# Patient Record
Sex: Female | Born: 1965 | Race: White | Hispanic: No | Marital: Married | Smoking: Current every day smoker
Health system: Southern US, Community
[De-identification: ages and names within clinical notes are randomized; demographics above are authoritative.]

## PROBLEM LIST (undated history)

## (undated) DIAGNOSIS — E119 Type 2 diabetes mellitus without complications: Secondary | ICD-10-CM

## (undated) DIAGNOSIS — Z789 Other specified health status: Secondary | ICD-10-CM

## (undated) DIAGNOSIS — M79605 Pain in left leg: Secondary | ICD-10-CM

## (undated) HISTORY — DX: Other specified health status: Z78.9

## (undated) HISTORY — DX: Pain in left leg: M79.605

## (undated) HISTORY — DX: Type 2 diabetes mellitus without complications: E11.9

---

## 1997-03-11 ENCOUNTER — Inpatient Hospital Stay (HOSPITAL_COMMUNITY): Admission: AD | Admit: 1997-03-11 | Discharge: 1997-03-13 | Payer: Self-pay | Admitting: Obstetrics and Gynecology

## 1997-10-22 ENCOUNTER — Encounter: Admission: RE | Admit: 1997-10-22 | Discharge: 1998-01-20 | Payer: Self-pay | Admitting: Obstetrics and Gynecology

## 1997-11-15 ENCOUNTER — Inpatient Hospital Stay (HOSPITAL_COMMUNITY): Admission: AD | Admit: 1997-11-15 | Discharge: 1997-11-15 | Payer: Self-pay | Admitting: Obstetrics and Gynecology

## 1998-01-05 ENCOUNTER — Ambulatory Visit (HOSPITAL_COMMUNITY): Admission: RE | Admit: 1998-01-05 | Discharge: 1998-01-05 | Payer: Self-pay | Admitting: Obstetrics and Gynecology

## 1998-02-04 ENCOUNTER — Ambulatory Visit (HOSPITAL_COMMUNITY): Admission: RE | Admit: 1998-02-04 | Discharge: 1998-02-04 | Payer: Self-pay | Admitting: Obstetrics and Gynecology

## 1998-02-14 ENCOUNTER — Inpatient Hospital Stay (HOSPITAL_COMMUNITY): Admission: AD | Admit: 1998-02-14 | Discharge: 1998-02-17 | Payer: Self-pay | Admitting: Obstetrics and Gynecology

## 1998-03-16 ENCOUNTER — Other Ambulatory Visit: Admission: RE | Admit: 1998-03-16 | Discharge: 1998-03-16 | Payer: Self-pay | Admitting: Obstetrics and Gynecology

## 1999-05-04 ENCOUNTER — Other Ambulatory Visit: Admission: RE | Admit: 1999-05-04 | Discharge: 1999-05-04 | Payer: Self-pay | Admitting: Obstetrics and Gynecology

## 1999-06-01 ENCOUNTER — Other Ambulatory Visit: Admission: RE | Admit: 1999-06-01 | Discharge: 1999-06-01 | Payer: Self-pay | Admitting: Obstetrics and Gynecology

## 1999-11-20 ENCOUNTER — Encounter: Payer: Self-pay | Admitting: Family Medicine

## 1999-11-20 ENCOUNTER — Encounter: Admission: RE | Admit: 1999-11-20 | Discharge: 1999-11-20 | Payer: Self-pay | Admitting: Family Medicine

## 2000-02-01 ENCOUNTER — Encounter: Admission: RE | Admit: 2000-02-01 | Discharge: 2000-05-01 | Payer: Self-pay | Admitting: Family Medicine

## 2000-04-08 ENCOUNTER — Other Ambulatory Visit: Admission: RE | Admit: 2000-04-08 | Discharge: 2000-04-08 | Payer: Self-pay | Admitting: Obstetrics and Gynecology

## 2001-04-15 ENCOUNTER — Other Ambulatory Visit: Admission: RE | Admit: 2001-04-15 | Discharge: 2001-04-15 | Payer: Self-pay | Admitting: Obstetrics and Gynecology

## 2001-11-29 ENCOUNTER — Emergency Department (HOSPITAL_COMMUNITY): Admission: EM | Admit: 2001-11-29 | Discharge: 2001-11-29 | Payer: Self-pay | Admitting: *Deleted

## 2002-12-15 ENCOUNTER — Other Ambulatory Visit: Admission: RE | Admit: 2002-12-15 | Discharge: 2002-12-15 | Payer: Self-pay | Admitting: Obstetrics and Gynecology

## 2003-01-16 HISTORY — PX: CHOLECYSTECTOMY: SHX55

## 2004-01-29 ENCOUNTER — Inpatient Hospital Stay (HOSPITAL_COMMUNITY): Admission: EM | Admit: 2004-01-29 | Discharge: 2004-01-31 | Payer: Self-pay | Admitting: *Deleted

## 2009-11-07 ENCOUNTER — Ambulatory Visit: Payer: Self-pay | Admitting: Internal Medicine

## 2010-03-14 ENCOUNTER — Other Ambulatory Visit: Payer: Self-pay | Admitting: Obstetrics and Gynecology

## 2014-06-29 ENCOUNTER — Encounter: Payer: Self-pay | Admitting: Surgery

## 2014-07-01 ENCOUNTER — Other Ambulatory Visit: Payer: Self-pay | Admitting: *Deleted

## 2014-07-01 DIAGNOSIS — M79604 Pain in right leg: Secondary | ICD-10-CM

## 2014-07-01 DIAGNOSIS — M79605 Pain in left leg: Principal | ICD-10-CM

## 2014-07-02 ENCOUNTER — Ambulatory Visit (HOSPITAL_COMMUNITY)
Admission: RE | Admit: 2014-07-02 | Discharge: 2014-07-02 | Disposition: A | Discharge status: Home / Self Care | Payer: 59 | Source: Ambulatory Visit | Attending: Surgery | Admitting: Surgery

## 2014-07-02 ENCOUNTER — Encounter: Payer: Self-pay | Admitting: Surgery

## 2014-07-02 ENCOUNTER — Ambulatory Visit (INDEPENDENT_AMBULATORY_CARE_PROVIDER_SITE_OTHER): Payer: 59 | Admitting: Surgery

## 2014-07-02 VITALS — BP 138/78 | HR 78 | Resp 16 | Ht 67.0 in | Wt 228.5 lb

## 2014-07-02 DIAGNOSIS — M79604 Pain in right leg: Secondary | ICD-10-CM

## 2014-07-02 DIAGNOSIS — I872 Venous insufficiency (chronic) (peripheral): Secondary | ICD-10-CM | POA: Diagnosis not present

## 2014-07-02 DIAGNOSIS — M79605 Pain in left leg: Secondary | ICD-10-CM

## 2014-07-02 NOTE — Progress Notes (Signed)
Patient name: Danielle Mccall MRN: [id number] DOB: [birth year] Sex: female   Referred by: Self  Reason for referral:  Chief Complaint  Patient presents with  . New Evaluation    c/o pain over left thigh varicosity, worse with prolonged standing, bilateral lower extremity swelling   . Varicose Veins    HISTORY OF PRESENT ILLNESS: This is a 49 year old female who comes in today for a painful area in her left upper thigh which she feels like is a vein.  This is been going on for several months.  It is aggravated by being on her legs at work.  She does complain of bilateral swelling which is better in the morning and worse as she is standing.  She denies having any ulcers.  The patient suffers from diabetes which is poorly controlled.  She states her blood sugars are in the 3-400 range.  She is a positive smoker.  She suffers from hypercholesterolemia but is not taking any medications right now, because she does not like the way they make her feel  Past Medical History  Diagnosis Date  . Diabetes mellitus without complication   . Leg pain, left     Past Surgical History  Procedure Laterality Date  . Cholecystectomy  2005  . Cesarean section  2000    History   Social History  . Marital Status: Married    Spouse Name: N/A  . Number of Children: N/A  . Years of Education: N/A   Occupational History  . Not on file.   Social History Main Topics  . Smoking status: Current Every Day Smoker -- 1.00 packs/day for 26 years    Types: Cigarettes  . Smokeless tobacco: Not on file  . Alcohol Use: Yes     Comment: on rare occasssions  . Drug Use: No  . Sexual Activity: Not on file   Other Topics Concern  . Not on file   Social History Narrative  . No narrative on file    History reviewed. No pertinent family history.  Allergies as of 07/02/2014  . (No Known Allergies)    No current outpatient prescriptions on file prior to visit.   No current facility-administered  medications on file prior to visit.     REVIEW OF SYSTEMS: Cardiovascular: No chest pain, chest pressure, palpitations, orthopnea, or dyspnea on exertion. No claudication or rest pain, positive for varicose veins and leg swelling Pulmonary: No productive cough, asthma or wheezing. Neurologic: No weakness, paresthesias, aphasia, or amaurosis. No dizziness. Hematologic: No bleeding problems or clotting disorders. Musculoskeletal: No joint pain or joint swelling. Gastrointestinal: No blood in stool or hematemesis Genitourinary: No dysuria or hematuria. Psychiatric:: No history of major depression. Integumentary: No rashes or ulcers. Constitutional: No fever or chills.  PHYSICAL EXAMINATION:  Filed Vitals:   07/02/14 1220  BP: 138/78  Pulse: 78  Resp: 16  Height:  (1.702 m)  Weight: 228 lb 8 oz (103.647 kg)   Body mass index is 35.78 kg/(m^2). General: The patient appears their stated age.   HEENT:  No gross abnormalities Pulmonary: Respirations are non-labored Musculoskeletal: There are no major deformities.   Neurologic: No focal weakness or paresthesias are detected, Skin: There are no ulcer or rashes noted. Psychiatric: The patient has normal affect. Cardiovascular: Multiple clusters of reticular veins in the left leg.  When she stands up there is a prominent appearing small varix in the left upper thigh which correlates to her symptoms.  Diagnostic Studies: I  have reviewed her venous reflux examination.  On the left she has reflux in the common femoral vein as well as a short segment in the mid great saphenous.  On the right there are 2 areas of reflux within the small saphenous and no other deep or superficial reflux   Assessment:  Chronic venous insufficiency Plan: The patient comes in today because of complaints of an area which becomes prominent with standing in her left upper thigh.  I feel that this correlates to a small varix.  She also has bilateral edema.  I  have recommended that she try compression stockings, 20-30, thigh high.  This should help with her swelling and will likely minimize the discomfort she is having within this area and her left thigh.  She will contact me if she does not get complete relief from the compression stockings.  If she returns, I would need to ultrasound the area to confirm that it is a varix and consider stab phlebectomy versus surgical excision of this area which I told the patient out for not to do is a think the symptoms should resolve with time on the road.  She will follow up on an as-needed basis.     Jorge Ny, M.D. [facility] Office: [phone] Pager:  [phone]

## 2016-08-31 ENCOUNTER — Ambulatory Visit: Payer: Self-pay | Attending: Family Medicine | Admitting: Family Medicine

## 2016-08-31 ENCOUNTER — Ambulatory Visit: Payer: Self-pay | Attending: Family Medicine | Admitting: Licensed Clinical Social Worker

## 2016-08-31 ENCOUNTER — Encounter: Payer: Self-pay | Admitting: Family Medicine

## 2016-08-31 VITALS — BP 125/72 | HR 82 | Temp 98.0°F | Resp 18 | Ht 68.0 in | Wt 207.4 lb

## 2016-08-31 DIAGNOSIS — Z0000 Encounter for general adult medical examination without abnormal findings: Secondary | ICD-10-CM

## 2016-08-31 DIAGNOSIS — F419 Anxiety disorder, unspecified: Secondary | ICD-10-CM

## 2016-08-31 DIAGNOSIS — I8393 Asymptomatic varicose veins of bilateral lower extremities: Secondary | ICD-10-CM

## 2016-08-31 DIAGNOSIS — Z1159 Encounter for screening for other viral diseases: Secondary | ICD-10-CM

## 2016-08-31 DIAGNOSIS — E1165 Type 2 diabetes mellitus with hyperglycemia: Secondary | ICD-10-CM

## 2016-08-31 DIAGNOSIS — F329 Major depressive disorder, single episode, unspecified: Secondary | ICD-10-CM

## 2016-08-31 DIAGNOSIS — M7989 Other specified soft tissue disorders: Secondary | ICD-10-CM | POA: Insufficient documentation

## 2016-08-31 DIAGNOSIS — Z79899 Other long term (current) drug therapy: Secondary | ICD-10-CM | POA: Insufficient documentation

## 2016-08-31 DIAGNOSIS — Z7984 Long term (current) use of oral hypoglycemic drugs: Secondary | ICD-10-CM | POA: Insufficient documentation

## 2016-08-31 DIAGNOSIS — E119 Type 2 diabetes mellitus without complications: Secondary | ICD-10-CM | POA: Insufficient documentation

## 2016-08-31 DIAGNOSIS — F32A Depression, unspecified: Secondary | ICD-10-CM

## 2016-08-31 LAB — POCT GLYCOSYLATED HEMOGLOBIN (HGB A1C): Hemoglobin A1C: 9.7

## 2016-08-31 LAB — POCT UA - MICROALBUMIN
Creatinine, POC: 300 mg/dL
Microalbumin Ur, POC: 10 mg/L

## 2016-08-31 LAB — GLUCOSE, POCT (MANUAL RESULT ENTRY)
POC Glucose: 324 mg/dl — AB (ref 70–99)
POC Glucose: 226 mg/dl — AB (ref 70–99)

## 2016-08-31 MED ORDER — GLUCOSE BLOOD VI STRP: ORAL_STRIP | 12 refills | Status: DC

## 2016-08-31 MED ORDER — MEDICAL COMPRESSION STOCKINGS MISC: 0 refills | Status: DC

## 2016-08-31 MED ORDER — TRUEPLUS LANCETS 28G MISC: 1.0000 | Freq: Once | 12 refills | Status: AC

## 2016-08-31 MED ORDER — TRUE METRIX METER W/DEVICE KIT: 1.0000 | PACK | Freq: Once | 0 refills | Status: AC

## 2016-08-31 MED ORDER — INSULIN ASPART 100 UNIT/ML ~~LOC~~ SOLN: 10.0000 [IU] | Freq: Once | SUBCUTANEOUS | Status: DC

## 2016-08-31 MED ORDER — METFORMIN HCL 1000 MG PO TABS: 1000.0000 mg | ORAL_TABLET | Freq: Two times a day (BID) | ORAL | 2 refills | Status: DC

## 2016-08-31 MED ORDER — CITALOPRAM HYDROBROMIDE 20 MG PO TABS: 20.0000 mg | ORAL_TABLET | Freq: Every day | ORAL | 1 refills | Status: DC

## 2016-08-31 NOTE — Progress Notes (Signed)
Patient is here for establish care   Patient been without medication for 6 months   Patient has not eaten for today

## 2016-08-31 NOTE — Progress Notes (Signed)
Subjective:  Patient ID: Danielle Mccall, female    DOB: 04-Feb-1965  Age: 51 y.o. MRN: [id number]  CC: Establish Care and Diabetes   HPI Danielle Mccall presents for Diabetes Mellitus: Patient presents for follow up of diabetes. She reports being without her medications for 6 months. Symptoms: none. Patient denies foot ulcerations, nausea, paresthesia of the feet, polydipsia, polyuria, visual disturbances and vomitting.  Evaluation to date has been included: fasting blood sugar and hemoglobin A1C.  Home sugars: patient does not check sugars. Treatment to date: metformin. Anxiety and Depression: Patient complains of anxious and depressed mood. She complains of depressed mood, difficulty concentrating and irritability. Onset was approximately 15 years ago, stable since that time.  She denies current suicidal and homicidal plan or intent.   Possible organic causes contributing are: none.  Risk factors: previous episode of depression Previous treatment includes Celexa . She reports starting and stopping medications on her own based on symptoms improvement. She complains of the following side effects from the treatment: none. She declines referrals. She is agreeable to speaking with LCSW. She complains of bilateral leg swelling. Patient denies chest pain, chest pressure/discomfort, near-syncope, orthopnea, palpitations and syncope.  She reports taking diuretics in the past to help with symptoms. Symptoms are intermittent and are worse in the evening. 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 Requests screening for Hep C. Reports ex-husband tested positive for Hep C. She reports being tested within the last 6 months and was negative but requests repeat testing.   Outpatient Medications Prior to Visit  Medication Sig Dispense Refill  . citalopram (CELEXA) 20 MG tablet Take 20 mg by mouth daily.    . metFORMIN (GLUCOPHAGE) 500 MG tablet Take 500 mg by mouth 2 (two) times  daily with a meal.     No facility-administered medications prior to visit.     ROS Review of Systems  Constitutional: Negative.   Eyes: Negative.   Respiratory: Negative.   Cardiovascular: Positive for leg swelling (edema). Negative for chest pain and palpitations.  Gastrointestinal: Negative.   Endocrine: Negative.   Musculoskeletal: Negative.   Skin: Negative.   Neurological: Negative.   Psychiatric/Behavioral: Positive for dysphoric mood. The patient is nervous/anxious.    Objective:  BP 125/72 (BP Location: Left Arm, Patient Position: Sitting, Cuff Size: Normal)   Pulse 82   Temp 98 F (36.7 C) (Oral)   Resp 18   Ht _0  (1.727 m)   Wt 207 lb 6.4 oz (94.1 kg)   SpO2 97%   BMI 31.54 kg/m   BP/Weight 08/31/2016 1/65/5374  Systolic BP 827 078  Diastolic BP 72 78  Wt. (Lbs) 207.4 228.5  BMI 31.54 35.78   Physical Exam  Constitutional: She appears well-developed and well-nourished.  Eyes: Pupils are equal, round, and reactive to light. Conjunctivae are normal.  Neck: Normal range of motion. Neck supple.  Cardiovascular: Normal rate, regular rhythm, normal heart sounds and intact distal pulses.   Pulmonary/Chest: Effort normal and breath sounds normal.  Abdominal: Soft. Bowel sounds are normal. There is no tenderness.  Skin: Skin is warm and dry. Abrasion (healed abrasions to anterior bilateral feet) noted.  Varicose veins BLE  Psychiatric: Her speech is normal. Her mood appears anxious. She expresses no homicidal and no suicidal ideation. She expresses no suicidal plans and no homicidal plans.  Nursing note and vitals reviewed.  Diabetic Foot Exam - Simple   Simple Foot Form Diabetic Foot exam was performed with the  following findings:  Yes 08/31/2016  9:24 AM  Visual Inspection See comments:  Yes Sensation Testing Intact to touch and monofilament testing bilaterally:  Yes Pulse Check Posterior Tibialis and Dorsalis pulse intact bilaterally:   Yes Comments Healed abrasions to bilateral feet.       Assessment & Plan:   Problem List Items Addressed This Visit      Endocrine   Diabetes mellitus without complication ([place]) - Primary   Increased dose of metformin   Check CBG BID bring glucometer/ log to next office visit   Follow up in 8 weeks.   Relevant Medications   insulin aspart (novoLOG) injection 10 Units   metFORMIN (GLUCOPHAGE) 1000 MG tablet   glucose blood test strip    Other Visit Diagnoses    Varicose veins of legs       Relevant Medications   Elastic Bandages & Supports (MEDICAL COMPRESSION STOCKINGS) MISC   Anxiety and depression       Follow up with LCSW in 4 weeks   Follow up with PCP in 8 weeks.   Relevant Medications   citalopram (CELEXA) 20 MG tablet   Need for hepatitis C screening test       Relevant Orders   Hepatitis C Antibody (Completed)   Healthcare maintenance       Relevant Orders   HIV antibody (with reflex) (Completed)      Meds ordered this encounter  Medications  . insulin aspart (novoLOG) injection 10 Units  . citalopram (CELEXA) 20 MG tablet    Sig: Take 1 tablet (20 mg total) by mouth daily.    Dispense:  30 tablet    Refill:  1    Order Specific Question:   Supervising Provider    Answer:   Tresa Garter [id number]  . metFORMIN (GLUCOPHAGE) 1000 MG tablet    Sig: Take 1 tablet (1,000 mg total) by mouth 2 (two) times daily with a meal.    Dispense:  60 tablet    Refill:  2    Order Specific Question:   Supervising Provider    Answer:   Tresa Garter [id number]  . Elastic Bandages & Supports (MEDICAL COMPRESSION STOCKINGS) MISC    Sig: APPLY ONCE TO LEFT AND RIGHT LOWER EXTREMITY FOR SWELLING AND SUPPORT. TO BE FITTED BY MEDICAL SUPPLY.    Dispense:  2 each    Refill:  0    Order Specific Question:   Supervising Provider    Answer:   Tresa Garter [id number]  . Blood Glucose Monitoring Suppl (TRUE METRIX METER) w/Device KIT    Sig: 1 Device by Does  not apply route once.    Dispense:  1 kit    Refill:  0    Order Specific Question:   Supervising Provider    Answer:   Tresa Garter [id number]  . TRUEPLUS LANCETS 28G MISC    Sig: 1 kit by Does not apply route once.    Dispense:  100 each    Refill:  12    Order Specific Question:   Supervising Provider    Answer:   Tresa Garter [id number]  . glucose blood test strip    Sig: Use as instructed    Dispense:  100 each    Refill:  12    Order Specific Question:   Supervising Provider    Answer:   Tresa Garter [[id number]]    Follow-up: Return in about 4 weeks (around 09/28/2016) for Depression/Anxiety with  Jasmine.   Alfonse Spruce FNP

## 2016-08-31 NOTE — BH Specialist Note (Signed)
Integrated Behavioral Health Initial Visit  MRN: [id number] Name: Danielle Mccall   Session Start time: 9:55 AM Session End time: 10:20 AM Total time: 25 minutes  Type of Service: Integrated Behavioral Health- Individual/Family Interpretor:No. Interpretor Name and Language: N/A   Warm Hand Off Completed.       SUBJECTIVE: Danielle Mccall is a 51 y.o. female accompanied by patient. Patient was referred by FNP Hairston for anxiety. Patient reports the following symptoms/concerns: overwhelming feelings of worry, difficulty relaxing, and irritability Duration of problem: "a while"; Severity of problem: severe  OBJECTIVE: Mood: Anxious and Affect: Appropriate Risk of harm to self or others: No plan to harm self or others   LIFE CONTEXT: Family and Social: Pt resides with adult daughter who she receives emotional support from. She has additional family and friends that she socializes with School/Work: Pt recently became unemployed (two weeks); however, boyfriend is employed. Pt does not receive any public benefits Self-Care: Pt enjoys walking. She reports smoking cigarettes (1 ppd) and marijuana "occasionally" Life Changes: Pt ended a ten year relationship with an "alcoholic". She is currently unemployed and experiencing financial strain  GOALS ADDRESSED: Patient will reduce symptoms of: anxiety and increase knowledge and/or ability of: coping skills and also: Increase adequate support systems for patient/family   INTERVENTIONS: Solution-Focused Strategies, Supportive Counseling, Psychoeducation and/or Health Education and Link to [facility]  Standardized Assessments completed: GAD-7 and PHQ 2&9  ASSESSMENT: Patient currently experiencing anxiety triggered by financial strain. She reports overwhelming feelings of worry, difficulty relaxing, and irritability. Patient receives limited support. Patient may benefit from psychoeducation, psychotherapy, and medication  management. LCSWA educated pt on how stress and substance use can negatively impact one's physical and mental health. LCSWA discussed benefits of applying healthy coping skills to decrease symptoms. Pt identified healthy strategies to implement on a routine basis. She was strongly encouraged to complete application for financial counseling and initiate psychotherapy. Pt plans to participate in medication management through PCP. LCSWA provided community resources for crisis intervention, psychotherapy, and food insecurity. PLAN: 1. Follow up with behavioral health clinician on : Pt was encouraged to contact LCSWA if symptoms worsen or fail to improve to schedule behavioral appointments at [facility]. 2. Behavioral recommendations: LCSWA recommends that pt apply healthy coping skills discussed, comply with medication management, and utilize provided resources. Pt is encouraged to schedule follow up appointment with LCSWA 3. Referral(s): Integrated Art gallery manager (In Clinic), Community Mental Health Services (LME/Outside Clinic) and Community Resources:  Food and Finances 4. "From scale of 1-10, how likely are you to follow plan?": 8/10  Danielle Larsson, LCSW 09/04/16 2:51 PM

## 2016-08-31 NOTE — Patient Instructions (Addendum)
Apply for orange card. Follow up with PCP in 8 weeks.  Type 2 Diabetes Mellitus, Self Care, Adult When you have type 2 diabetes (type 2 diabetes mellitus), you must keep your blood sugar (glucose) under control. You can do this with:  Nutrition.  Exercise.  Lifestyle changes.  Medicines or insulin, if needed.  Support from your doctors and others.  How do I manage my blood sugar?  Check your blood sugar level every day, as often as told.  Call your doctor if your blood sugar is above your goal numbers for 2 tests in a row.  Have your A1c (hemoglobin A1c) level checked at least two times a year. Have it checked more often if your doctor tells you to. Your doctor will set treatment goals for you. Generally, you should have these blood sugar levels:  Before meals (preprandial): 80-130 mg/dL (4.4-7.2 mmol/L).  After meals (postprandial): lower than 180 mg/dL (10 mmol/L).  A1c level: less than 7%.  What do I need to know about high blood sugar? High blood sugar is called hyperglycemia. Know the signs of high blood sugar. Signs may include:  Feeling: ? Thirsty. ? Hungry. ? Very tired.  Needing to pee (urinate) more than usual.  Blurry vision.  What do I need to know about low blood sugar? Low blood sugar is called hypoglycemia. This is when blood sugar is at or below 70 mg/dL (3.9 mmol/L). Symptoms may include:  Feeling: ? Hungry. ? Worried or nervous (anxious). ? Sweaty and clammy. ? Confused. ? Dizzy. ? Sleepy. ? Sick to your stomach (nauseous).  Having: ? A fast heartbeat (palpitations). ? A headache. ? A change in your vision. ? Jerky movements that you cannot control (seizure). ? Nightmares. ? Tingling or no feeling (numbness) around the mouth, lips, or tongue.  Having trouble with: ? Talking. ? Paying attention (concentrating). ? Moving (coordination). ? Sleeping.  Shaking.  Passing out (fainting).  Getting upset easily  (irritability).  Treating low blood sugar  To treat low blood sugar, eat or drink something sugary right away. If you can think clearly and swallow safely, follow the 15:15 rule:  Take 15 grams of a fast-acting carb (carbohydrate). Some fast-acting carbs are: ? 1 tube of glucose gel. ? 3 sugar tablets (glucose pills). ? 6-8 pieces of hard candy. ? 4 oz (120 mL) of fruit juice. ? 4 oz (120 mL) regular (not diet) soda.  Check your blood sugar 15 minutes after you take the carb.  If your blood sugar is still at or below 70 mg/dL (3.9 mmol/L), take 15 grams of a carb again.  If your blood sugar does not go above 70 mg/dL (3.9 mmol/L) after 3 tries, get help right away.  After your blood sugar goes back to normal, eat a meal or a snack within 1 hour.  Treating very low blood sugar If your blood sugar is at or below 54 mg/dL (3 mmol/L), you have very low blood sugar (severe hypoglycemia). This is an emergency. Do not wait to see if the symptoms will go away. Get medical help right away. Call your local emergency services (911 in the [place] Do not drive yourself to the hospital. If you have very low blood sugar and you cannot eat or drink, you may need a glucagon shot (injection). A family member or friend should learn how to check your blood sugar and how to give you a glucagon shot. Ask your doctor if you need to have a glucagon shot  kit at home. What else is important to manage my diabetes? Medicine Follow these instructions about insulin and diabetes medicines:  Take them as told by your doctor.  Adjust them as told by your doctor.  Do not run out of them.  Having diabetes can raise your risk for other long-term conditions. These include heart or kidney disease. Your doctor may prescribe medicines to help prevent problems from diabetes. Food   Make healthy food choices. These include: ? Chicken, fish, egg whites, and beans. ? Oats, whole wheat, bulgur, brown rice, quinoa, and  millet. ? Fresh fruits and vegetables. ? Low-fat dairy products. ? Nuts, avocado, olive oil, and canola oil.  Make a food plan with a specialist (dietitian).  Follow instructions from your doctor about what you cannot eat or drink.  Drink enough fluid to keep your pee (urine) clear or pale yellow.  Eat healthy snacks between healthy meals.  Keep track of carbs that you eat. Read food labels. Learn food serving sizes.  Follow your sick day plan when you cannot eat or drink normally. Make this plan with your doctor so it is ready to use. Activity  Exercise at least 3 times a week.  Do not go more than 2 days without exercising.  Talk with your doctor before you start a new exercise. Your doctor may need to adjust your insulin, medicines, or food. Lifestyle   Do not use any tobacco products. These include cigarettes, chewing tobacco, and e-cigarettes.If you need help quitting, ask your doctor.  Ask your doctor how much alcohol is safe for you.  Learn to deal with stress. If you need help with this, ask your doctor. Body care  Stay up to date with your shots (immunizations).  Have your eyes and feet checked by a doctor as often as told.  Check your skin and feet every day. Check for cuts, bruises, redness, blisters, or sores.  Brush your teeth and gums two times a day.  Floss at least one time a day.  Go to the dentist least one time every 6 months.  Stay at a healthy weight. General instructions   Take over-the-counter and prescription medicines only as told by your doctor.  Share your diabetes care plan with: ? Your work or school. ? People you live with.  Check your pee (urine) for ketones: ? When you are sick. ? As told by your doctor.  Carry a card or wear jewelry that says that you have diabetes.  Ask your doctor: ? Do I need to meet with a diabetes educator? ? Where can I find a support group for people with diabetes?  Keep all follow-up visits as  told by your doctor. This is important. Where to find more information: To learn more about diabetes, visit:  American Diabetes Association: www.diabetes.org  American Association of Diabetes Educators: www.diabeteseducator.org/patient-resources  This information is not intended to replace advice given to you by your health care provider. Make sure you discuss any questions you have with your health care provider. Document Released: 04/25/2015 Document Revised: 06/09/2015 Document Reviewed: 02/04/2015 Elsevier Interactive Patient Education  2018 [employer].   Varicose Veins Varicose veins are veins that have become enlarged and twisted. They are usually seen in the legs but can occur in other parts of the body as well. What are the causes? This condition is the result of valves in the veins not working properly. Valves in the veins help to return blood from the leg to the heart. If  these valves are damaged, blood flows backward and backs up into the veins in the leg near the skin. This causes the veins to become larger. What increases the risk? People who are on their feet a lot, who are pregnant, or who are overweight are more likely to develop varicose veins. What are the signs or symptoms?  Bulging, twisted-appearing, bluish veins, most commonly found on the legs.  Leg pain or a feeling of heaviness. These symptoms may be worse at the end of the day.  Leg swelling.  Changes in skin color. How is this diagnosed? A health care provider can usually diagnose varicose veins by examining your legs. Your health care provider may also recommend an ultrasound of your leg veins. How is this treated? Most varicose veins can be treated at home.However, other treatments are available for people who have persistent symptoms or want to improve the cosmetic appearance of the varicose veins. These treatment options include:  Sclerotherapy. A solution is injected into the vein to close it  off.  Laser treatment. A laser is used to heat the vein to close it off.  Radiofrequency vein ablation. An electrical current produced by radio waves is used to close off the vein.  Phlebectomy. The vein is surgically removed through small incisions made over the varicose vein.  Vein ligation and stripping. The vein is surgically removed through incisions made over the varicose vein after the vein has been tied (ligated).  Follow these instructions at home:  Do not stand or sit in one position for long periods of time. Do not sit with your legs crossed. Rest with your legs raised during the day.  Wear compression stockings as directed by your health care provider. These stockings help to prevent blood clots and reduce swelling in your legs.  Do not wear other tight, encircling garments around your legs, pelvis, or waist.  Walk as much as possible to increase blood flow.  Raise the foot of your bed at night with 2-inch blocks.  If you get a cut in the skin over the vein and the vein bleeds, lie down with your leg raised and press on it with a clean cloth until the bleeding stops. Then place a bandage (dressing) on the cut. See your health care provider if it continues to bleed. Contact a health care provider if:  The skin around your ankle starts to break down.  You have pain, redness, tenderness, or hard swelling in your leg over a vein.  You are uncomfortable because of leg pain. This information is not intended to replace advice given to you by your health care provider. Make sure you discuss any questions you have with your health care provider. Document Released: 10/11/2004 Document Revised: 06/09/2015 Document Reviewed: 07/05/2015 Elsevier Interactive Patient Education  2017 [employer].

## 2016-09-01 LAB — HEPATITIS C ANTIBODY

## 2016-09-01 LAB — CMP14+EGFR
A/G RATIO: 1.4 (ref 1.2–2.2)
ALBUMIN: 4.1 g/dL (ref 3.5–5.5)
ALK PHOS: 67 IU/L (ref 39–117)
ALT: 13 IU/L (ref 0–32)
AST: 13 IU/L (ref 0–40)
BILIRUBIN TOTAL: 0.3 mg/dL (ref 0.0–1.2)
BUN / CREAT RATIO: 17 (ref 9–23)
BUN: 11 mg/dL (ref 6–24)
CHLORIDE: 99 mmol/L (ref 96–106)
CO2: 24 mmol/L (ref 20–29)
Calcium: 9.9 mg/dL (ref 8.7–10.2)
Creatinine, Ser: 0.64 mg/dL (ref 0.57–1.00)
GFR calc Af Amer: 121 mL/min/{1.73_m2} (ref >59)
GFR calc non Af Amer: 105 mL/min/{1.73_m2} (ref >59)
GLUCOSE: 211 mg/dL — AB (ref 65–99)
Globulin, Total: 2.9 g/dL (ref 1.5–4.5)
POTASSIUM: 5.2 mmol/L (ref 3.5–5.2)
Sodium: 139 mmol/L (ref 134–144)
Total Protein: 7 g/dL (ref 6.0–8.5)

## 2016-09-01 LAB — LIPID PANEL
CHOLESTEROL TOTAL: 261 mg/dL — AB (ref 100–199)
Chol/HDL Ratio: 3.8 ratio (ref 0.0–4.4)
HDL: 69 mg/dL (ref >39)
LDL Calculated: 175 mg/dL — ABNORMAL HIGH (ref 0–99)
Triglycerides: 86 mg/dL (ref 0–149)
VLDL CHOLESTEROL CAL: 17 mg/dL (ref 5–40)

## 2016-09-01 LAB — HIV ANTIBODY (ROUTINE TESTING W REFLEX): HIV SCREEN 4TH GENERATION: NONREACTIVE

## 2016-09-10 ENCOUNTER — Other Ambulatory Visit: Payer: Self-pay | Admitting: Family Medicine

## 2016-09-10 DIAGNOSIS — E782 Mixed hyperlipidemia: Secondary | ICD-10-CM

## 2016-09-10 MED ORDER — ATORVASTATIN CALCIUM 20 MG PO TABS: 20.0000 mg | ORAL_TABLET | Freq: Every day | ORAL | 2 refills | Status: DC

## 2016-09-11 ENCOUNTER — Telehealth: Payer: Self-pay

## 2016-09-11 NOTE — Telephone Encounter (Signed)
-----   Message from Mandesia R Hairston, FNP sent at 09/10/2016  7:38 PM EDT ----- Lipid levels were elevated. This can increase your risk of heart disease. You will be prescribed atorvastatin. Recommend follow up in 3 months. Hepatitis C is negative.  HIV is negative.  Kidney function normal Liver function normal  

## 2016-09-11 NOTE — Telephone Encounter (Signed)
CMA call regarding lab results  Patient did not answer & unable to leave message  

## 2016-09-12 NOTE — Telephone Encounter (Signed)
Unable to leave message. Voicemail is not set up

## 2016-09-18 ENCOUNTER — Telehealth: Payer: Self-pay

## 2016-09-18 NOTE — Telephone Encounter (Signed)
CMA call regarding lab results   Patient did not answer but unable to leave message

## 2016-09-18 NOTE — Telephone Encounter (Signed)
-----   Message from Lizbeth BarkMandesia R Hairston, FNP sent at 09/10/2016  7:38 PM EDT ----- Lipid levels were elevated. This can increase your risk of heart disease. You will be prescribed atorvastatin. Recommend follow up in 3 months. Hepatitis C is negative.  HIV is negative.  Kidney function normal Liver function normal

## 2016-09-28 ENCOUNTER — Ambulatory Visit: Payer: Self-pay | Admitting: Family Medicine

## 2016-10-23 ENCOUNTER — Encounter: Payer: Self-pay | Admitting: Family Medicine

## 2016-10-23 ENCOUNTER — Ambulatory Visit: Payer: BC Managed Care – PPO | Attending: Family Medicine | Admitting: Family Medicine

## 2016-10-23 VITALS — BP 118/75 | HR 85 | Temp 98.1°F | Resp 18 | Ht 68.0 in | Wt 203.8 lb

## 2016-10-23 DIAGNOSIS — E782 Mixed hyperlipidemia: Secondary | ICD-10-CM

## 2016-10-23 DIAGNOSIS — B9789 Other viral agents as the cause of diseases classified elsewhere: Secondary | ICD-10-CM

## 2016-10-23 DIAGNOSIS — E1165 Type 2 diabetes mellitus with hyperglycemia: Secondary | ICD-10-CM | POA: Diagnosis not present

## 2016-10-23 DIAGNOSIS — Z7984 Long term (current) use of oral hypoglycemic drugs: Secondary | ICD-10-CM | POA: Insufficient documentation

## 2016-10-23 DIAGNOSIS — R197 Diarrhea, unspecified: Secondary | ICD-10-CM | POA: Diagnosis not present

## 2016-10-23 DIAGNOSIS — Z79899 Other long term (current) drug therapy: Secondary | ICD-10-CM | POA: Insufficient documentation

## 2016-10-23 DIAGNOSIS — J069 Acute upper respiratory infection, unspecified: Secondary | ICD-10-CM

## 2016-10-23 DIAGNOSIS — E119 Type 2 diabetes mellitus without complications: Secondary | ICD-10-CM

## 2016-10-23 LAB — GLUCOSE, POCT (MANUAL RESULT ENTRY): POC GLUCOSE: 242 mg/dL — AB (ref 70–99)

## 2016-10-23 MED ORDER — PHENYLEPHRINE-CHLORPHEN-DM 5-2-15 MG/5ML PO SYRP: 5.0000 mL | ORAL_SOLUTION | Freq: Four times a day (QID) | ORAL | 0 refills | Status: DC | PRN

## 2016-10-23 MED ORDER — ATORVASTATIN CALCIUM 20 MG PO TABS: 20.0000 mg | ORAL_TABLET | Freq: Every day | ORAL | 2 refills | Status: DC

## 2016-10-23 MED ORDER — METFORMIN HCL ER 500 MG PO TB24: 1000.0000 mg | ORAL_TABLET | Freq: Two times a day (BID) | ORAL | 2 refills | Status: DC

## 2016-10-23 MED ORDER — LOPERAMIDE HCL 2 MG PO TABS: 2.0000 mg | ORAL_TABLET | Freq: Four times a day (QID) | ORAL | 0 refills | Status: DC | PRN

## 2016-10-23 NOTE — Patient Instructions (Addendum)
Food Choices to Help Relieve Diarrhea, Adult When you have diarrhea, the foods you eat and your eating habits are very important. Choosing the right foods and drinks can help:  Relieve diarrhea.  Replace lost fluids and nutrients.  Prevent dehydration.  What general guidelines should I follow? Relieving diarrhea  Choose foods with less than 2 g or .07 oz. of fiber per serving.  Limit fats to less than 8 tsp (38 g or 1.34 oz.) a day.  Avoid the following: ? Foods and beverages sweetened with high-fructose corn syrup, honey, or sugar alcohols such as xylitol, sorbitol, and mannitol. ? Foods that contain a lot of fat or sugar. ? Fried, greasy, or spicy foods. ? High-fiber grains, breads, and cereals. ? Raw fruits and vegetables.  Eat foods that are rich in probiotics. These foods include dairy products such as yogurt and fermented milk products. They help increase healthy bacteria in the stomach and intestines (gastrointestinal tract, or GI tract).  If you have lactose intolerance, avoid dairy products. These may make your diarrhea worse.  Take medicine to help stop diarrhea (antidiarrheal medicine) only as told by your health care provider. Replacing nutrients  Eat small meals or snacks every 3-4 hours.  Eat bland foods, such as white rice, toast, or baked potato, until your diarrhea starts to get better. Gradually reintroduce nutrient-rich foods as tolerated or as told by your health care provider. This includes: ? Well-cooked protein foods. ? Peeled, seeded, and soft-cooked fruits and vegetables. ? Low-fat dairy products.  Take vitamin and mineral supplements as told by your health care provider. Preventing dehydration   Start by sipping water or a special solution to prevent dehydration (oral rehydration solution, ORS). Urine that is clear or pale yellow means that you are getting enough fluid.  Try to drink at least 8-10 cups of fluid each day to help replace lost  fluids.  You may add other liquids in addition to water, such as clear juice or decaffeinated sports drinks, as tolerated or as told by your health care provider.  Avoid drinks with caffeine, such as coffee, tea, or soft drinks.  Avoid alcohol. What foods are recommended? The items listed may not be a complete list. Talk with your health care provider about what dietary choices are best for you. Grains White rice. White, French, or pita breads (fresh or toasted), including plain rolls, buns, or bagels. White pasta. Saltine, soda, or graham crackers. Pretzels. Low-fiber cereal. Cooked cereals made with water (such as cornmeal, farina, or cream cereals). Plain muffins. Matzo. Melba toast. Zwieback. Vegetables Potatoes (without the skin). Most well-cooked and canned vegetables without skins or seeds. Tender lettuce. Fruits Apple sauce. Fruits canned in juice. Cooked apricots, cherries, grapefruit, peaches, pears, or plums. Fresh bananas and cantaloupe. Meats and other protein foods Baked or boiled chicken. Eggs. Tofu. Fish. Seafood. Smooth nut butters. Ground or well-cooked tender beef, ham, veal, lamb, pork, or poultry. Dairy Plain yogurt, kefir, and unsweetened liquid yogurt. Lactose-free milk, buttermilk, skim milk, or soy milk. Low-fat or nonfat hard cheese. Beverages Water. Low-calorie sports drinks. Fruit juices without pulp. Strained tomato and vegetable juices. Decaffeinated teas. Sugar-free beverages not sweetened with sugar alcohols. Oral rehydration solutions, if approved by your health care provider. Seasoning and other foods Bouillon, broth, or soups made from recommended foods. What foods are not recommended? The items listed may not be a complete list. Talk with your health care provider about what dietary choices are best for you. Grains Whole grain, whole wheat,   bran, or rye breads, rolls, pastas, and crackers. Wild or brown rice. Whole grain or bran cereals. Barley. Oats and  oatmeal. Corn tortillas or taco shells. Granola. Popcorn. Vegetables Raw vegetables. Fried vegetables. Cabbage, broccoli, Brussels sprouts, artichokes, baked beans, beet greens, corn, kale, legumes, peas, sweet potatoes, and yams. Potato skins. Cooked spinach and cabbage. Fruits Dried fruit, including raisins and dates. Raw fruits. Stewed or dried prunes. Canned fruits with syrup. Meat and other protein foods Fried or fatty meats. Deli meats. Chunky nut butters. Nuts and seeds. Beans and lentils. Tomasa Blase. Hot dogs. Sausage. Dairy High-fat cheeses. Whole milk, chocolate milk, and beverages made with milk, such as milk shakes. Half-and-half. Cream. sour cream. Ice cream. Beverages Caffeinated beverages (such as coffee, tea, soda, or energy drinks). Alcoholic beverages. Fruit juices with pulp. Prune juice. Soft drinks sweetened with high-fructose corn syrup or sugar alcohols. High-calorie sports drinks. Fats and oils Butter. Cream sauces. Margarine. Salad oils. Plain salad dressings. Olives. Avocados. Mayonnaise. Sweets and desserts Sweet rolls, doughnuts, and sweet breads. Sugar-free desserts sweetened with sugar alcohols such as xylitol and sorbitol. Seasoning and other foods Honey. Hot sauce. Chili powder. Gravy. Cream-based or milk-based soups. Pancakes and waffles. Summary  When you have diarrhea, the foods you eat and your eating habits are very important.  Make sure you get at least 8-10 cups of fluid each day, or enough to keep your urine clear or pale yellow.  Eat bland foods and gradually reintroduce healthy, nutrient-rich foods as tolerated, or as told by your health care provider.  Avoid high-fiber, fried, greasy, or spicy foods. This information is not intended to replace advice given to you by your health care provider. Make sure you discuss any questions you have with your health care provider. Document Released: 03/24/2003 Document Revised: 12/30/2015 Document Reviewed:  12/30/2015 Elsevier Interactive Patient Education  2017 [employer]  Upper Respiratory Infection, Adult Most upper respiratory infections (URIs) are caused by a virus. A URI affects the nose, throat, and upper air passages. The most common type of URI is often called "the common cold." Follow these instructions at home:  Take medicines only as told by your doctor.  Gargle warm saltwater or take cough drops to comfort your throat as told by your doctor.  Use a warm mist humidifier or inhale steam from a shower to increase air moisture. This may make it easier to breathe.  Drink enough fluid to keep your pee (urine) clear or pale yellow.  Eat soups and other clear broths.  Have a healthy diet.  Rest as needed.  Go back to work when your fever is gone or your doctor says it is okay. ? You may need to stay home longer to avoid giving your URI to others. ? You can also wear a face mask and wash your hands often to prevent spread of the virus.  Use your inhaler more if you have asthma.  Do not use any tobacco products, including cigarettes, chewing tobacco, or electronic cigarettes. If you need help quitting, ask your doctor. Contact a doctor if:  You are getting worse, not better.  Your symptoms are not helped by medicine.  You have chills.  You are getting more short of breath.  You have brown or red mucus.  You have yellow or brown discharge from your nose.  You have pain in your face, especially when you bend forward.  You have a fever.  You have puffy (swollen) neck glands.  You have pain while swallowing.  You have white areas in the back of your throat. Get help right away if:  You have very bad or constant: ? Headache. ? Ear pain. ? Pain in your forehead, behind your eyes, and over your cheekbones (sinus pain). ? Chest pain.  You have long-lasting (chronic) lung disease and any of the following: ? Wheezing. ? Long-lasting cough. ? Coughing up  blood. ? A change in your usual mucus.  You have a stiff neck.  You have changes in your: ? Vision. ? Hearing. ? Thinking. ? Mood. This information is not intended to replace advice given to you by your health care provider. Make sure you discuss any questions you have with your health care provider. Document Released: 06/20/2007 Document Revised: 09/04/2015 Document Reviewed: 04/08/2013 Elsevier Interactive Patient Education  2018 [employer].

## 2016-10-23 NOTE — Progress Notes (Signed)
Patient is here for nauseas & upset stomach  No fever or vomiting  Patient complains cold sweat   Patient has not taking her metformin for today

## 2016-10-23 NOTE — Progress Notes (Deleted)
Friday before last  Runny nose, cough worse at night scant clear drainage , congestion  Sunday nausea,  went work feel light headeded  Stomach upset  Laying down feels better  Fatigue Boyfriend had simliar symptoms No chills/body aches  Diarrhea "really bad" Wasn't taking metformin as directed  Started taking 2nd pill Sunday Monday upset stomach Diarrheas  4 to 5  This monring 2 wtice  New foods  OTC alka seltzer cold/flu , Nyquil, tylenol severe cold  Stopped Sunday  im

## 2016-10-24 NOTE — Progress Notes (Signed)
Subjective:  Patient ID: Danielle Mccall, female    DOB: [birth year]  Age: 51 y.o. MRN: [id number]  CC: Establish Care   HPI Danielle Mccall presents for gastrointestinal and upper respiratory symptoms. Onset was Friday before last. She reports symptoms of rhinorrhea, cough productive cough with scant clear-sputum, and nasal congestion. She reports on Sunday developing nausea, fatigue, and feeling lightheaded. She reports episodes of diarrhea on Sunday and Monday. She reports 4 episodes on Sunday and 2 episodes on Monday. She reports she started taking metformin twice a day Sunday and Monday. She denies any new foods or contacts. She reports her boyfriend had similar symptoms of nausea. She reports previously only taking metformin daily instead of twice a day as prescribed. She denies any symptoms of chills, body aches, fever. She reports taking over-the-counter Alka-Seltzer cold and flu, NyQuil, Tylenol severe cold for symptoms.     Outpatient Medications Prior to Visit  Medication Sig Dispense Refill  . metFORMIN (GLUCOPHAGE) 1000 MG tablet Take 1 tablet (1,000 mg total) by mouth 2 (two) times daily with a meal. 60 tablet 2  . citalopram (CELEXA) 20 MG tablet Take 1 tablet (20 mg total) by mouth daily. 30 tablet 1  . Elastic Bandages & Supports (MEDICAL COMPRESSION STOCKINGS) MISC APPLY ONCE TO LEFT AND RIGHT LOWER EXTREMITY FOR SWELLING AND SUPPORT. TO BE FITTED BY MEDICAL SUPPLY. 2 each 0  . glucose blood test strip Use as instructed 100 each 12  . atorvastatin (LIPITOR) 20 MG tablet Take 1 tablet (20 mg total) by mouth daily. 30 tablet 2   Facility-Administered Medications Prior to Visit  Medication Dose Route Frequency Provider Last Rate Last Dose  . insulin aspart (novoLOG) injection 10 Units  10 Units Subcutaneous Once Stevon Gough R, FNP        ROS Review of Systems  Constitutional: Negative.   HENT: Positive for congestion and rhinorrhea.   Respiratory: Positive  for cough.   Cardiovascular: Negative.   Gastrointestinal: Positive for diarrhea and nausea.   Objective:  BP 118/75 (BP Location: Left Arm, Patient Position: Sitting, Cuff Size: Normal)   Pulse 85   Temp 98.1 F (36.7 C) (Oral)   Resp 18   Ht  (1.727 m)   Wt 203 lb 12.8 oz (92.4 kg)   SpO2 96%   BMI 30.99 kg/m   BP/Weight 10/23/2016 08/31/2016 07/02/2014  Systolic BP 118 125 138  Diastolic BP 75 72 78  Wt. (Lbs) 203.8 207.4 228.5  BMI 30.99 31.54 35.78     Physical Exam  Constitutional: She appears well-developed and well-nourished.  HENT:  Head: Normocephalic and atraumatic.  Right Ear: External ear normal.  Left Ear: External ear normal.  Nose: Rhinorrhea (clear) present.  Mouth/Throat: Oropharynx is clear and moist.  Eyes: Pupils are equal, round, and reactive to light. Conjunctivae are normal.  Cardiovascular: Normal rate, regular rhythm, normal heart sounds and intact distal pulses.   Pulmonary/Chest: Effort normal and breath sounds normal.  Abdominal: Soft. Bowel sounds are normal. There is no tenderness.  Skin: Skin is warm and dry.  Nursing note and vitals reviewed.    Assessment & Plan:   1. Type 2 diabetes mellitus with hyperglycemia, without long-term current use of insulin (HCC)  - Glucose (CBG) - metFORMIN (GLUCOPHAGE-XR) 500 MG 24 hr tablet; Take 2 tablets (1,000 mg total) by mouth 2 (two) times daily with a meal.  Dispense: 60 tablet; Refill: 2  2. Viral URI with cough  - Phenylephrine-Chlorphen-DM 05-16-13  MG/5ML SYRP; Take 5 mLs by mouth every 6 (six) hours as needed.  Dispense: 1 Bottle; Refill: 0  3. Mixed hyperlipidemia  - atorvastatin (LIPITOR) 20 MG tablet; Take 1 tablet (20 mg total) by mouth daily.  Dispense: 30 tablet; Refill: 2  4. Diarrhea, unspecified type Suspect symptoms are related to increased dosage of metformin. Symptoms began the day patient increased metformin dose. We will change metformin to extended release for reduced  risk of GI side effect .   - loperamide (IMODIUM A-D) 2 MG tablet; Take 1 tablet (2 mg total) by mouth 4 (four) times daily as needed for diarrhea or loose stools.  Dispense: 30 tablet; Refill: 0   Meds ordered this encounter  Medications  . metFORMIN (GLUCOPHAGE-XR) 500 MG 24 hr tablet    Sig: Take 2 tablets (1,000 mg total) by mouth 2 (two) times daily with a meal.    Dispense:  60 tablet    Refill:  2    Order Specific Question:   Supervising Provider    Answer:   Quentin Angst [id number]  . loperamide (IMODIUM A-D) 2 MG tablet    Sig: Take 1 tablet (2 mg total) by mouth 4 (four) times daily as needed for diarrhea or loose stools.    Dispense:  30 tablet    Refill:  0    Order Specific Question:   Supervising Provider    Answer:   Quentin Angst [id number]  . atorvastatin (LIPITOR) 20 MG tablet    Sig: Take 1 tablet (20 mg total) by mouth daily.    Dispense:  30 tablet    Refill:  2    Order Specific Question:   Supervising Provider    Answer:   Quentin Angst [id number]  . Phenylephrine-Chlorphen-DM 05-16-13 MG/5ML SYRP    Sig: Take 5 mLs by mouth every 6 (six) hours as needed.    Dispense:  1 Bottle    Refill:  0    Order Specific Question:   Supervising Provider    Answer:   Quentin Angst [id number]    Follow-up: Return if symptoms worsen or fail to improve.   Lizbeth Bark FNP

## 2016-10-26 ENCOUNTER — Ambulatory Visit: Payer: BC Managed Care – PPO | Attending: Family Medicine | Admitting: Family Medicine

## 2016-10-26 ENCOUNTER — Encounter: Payer: Self-pay | Admitting: Family Medicine

## 2016-10-26 VITALS — BP 133/82 | HR 76 | Temp 98.3°F | Resp 18 | Ht 68.0 in | Wt 205.4 lb

## 2016-10-26 DIAGNOSIS — Z23 Encounter for immunization: Secondary | ICD-10-CM | POA: Diagnosis not present

## 2016-10-26 DIAGNOSIS — Z0000 Encounter for general adult medical examination without abnormal findings: Secondary | ICD-10-CM

## 2016-10-26 DIAGNOSIS — E119 Type 2 diabetes mellitus without complications: Secondary | ICD-10-CM | POA: Diagnosis not present

## 2016-10-26 DIAGNOSIS — E1165 Type 2 diabetes mellitus with hyperglycemia: Secondary | ICD-10-CM | POA: Diagnosis not present

## 2016-10-26 DIAGNOSIS — Z794 Long term (current) use of insulin: Secondary | ICD-10-CM | POA: Insufficient documentation

## 2016-10-26 MED ORDER — METFORMIN HCL ER 500 MG PO TB24: 1000.0000 mg | ORAL_TABLET | Freq: Two times a day (BID) | ORAL | 3 refills | Status: DC

## 2016-10-26 NOTE — Progress Notes (Signed)
Patient is here for f/up   Patient has employment paperwork for [employer] to fill out

## 2016-10-26 NOTE — Patient Instructions (Signed)
For TB screening option to come back on Monday or follow up with [facility].  Diabetes Mellitus and Food It is important for you to manage your blood sugar (glucose) level. Your blood glucose level can be greatly affected by what you eat. Eating healthier foods in the appropriate amounts throughout the day at about the same time each day will help you control your blood glucose level. It can also help slow or prevent worsening of your diabetes mellitus. Healthy eating may even help you improve the level of your blood pressure and reach or maintain a healthy weight. General recommendations for healthful eating and cooking habits include:  Eating meals and snacks regularly. Avoid going long periods of time without eating to lose weight.  Eating a diet that consists mainly of plant-based foods, such as fruits, vegetables, nuts, legumes, and whole grains.  Using low-heat cooking methods, such as baking, instead of high-heat cooking methods, such as deep frying.  Work with your dietitian to make sure you understand how to use the Nutrition Facts information on food labels. How can food affect me? Carbohydrates Carbohydrates affect your blood glucose level more than any other type of food. Your dietitian will help you determine how many carbohydrates to eat at each meal and teach you how to count carbohydrates. Counting carbohydrates is important to keep your blood glucose at a healthy level, especially if you are using insulin or taking certain medicines for diabetes mellitus. Alcohol Alcohol can cause sudden decreases in blood glucose (hypoglycemia), especially if you use insulin or take certain medicines for diabetes mellitus. Hypoglycemia can be a life-threatening condition. Symptoms of hypoglycemia (sleepiness, dizziness, and disorientation) are similar to symptoms of having too much alcohol. If your health care provider has given you approval to drink alcohol, do so in moderation and use the  following guidelines:  Women should not have more than one drink per day, and men should not have more than two drinks per day. One drink is equal to: ? 12 oz of beer. ? 5 oz of wine. ? 1 oz of hard liquor.  Do not drink on an empty stomach.  Keep yourself hydrated. Have water, diet soda, or unsweetened iced tea.  Regular soda, juice, and other mixers might contain a lot of carbohydrates and should be counted.  What foods are not recommended? As you make food choices, it is important to remember that all foods are not the same. Some foods have fewer nutrients per serving than other foods, even though they might have the same number of calories or carbohydrates. It is difficult to get your body what it needs when you eat foods with fewer nutrients. Examples of foods that you should avoid that are high in calories and carbohydrates but low in nutrients include:  Trans fats (most processed foods list trans fats on the Nutrition Facts label).  Regular soda.  Juice.  Candy.  Sweets, such as cake, pie, doughnuts, and cookies.  Fried foods.  What foods can I eat? Eat nutrient-rich foods, which will nourish your body and keep you healthy. The food you should eat also will depend on several factors, including:  The calories you need.  The medicines you take.  Your weight.  Your blood glucose level.  Your blood pressure level.  Your cholesterol level.  You should eat a variety of foods, including:  Protein. ? Lean cuts of meat. ? Proteins low in saturated fats, such as fish, egg whites, and beans. Avoid processed meats.  Fruits  and vegetables. ? Fruits and vegetables that may help control blood glucose levels, such as apples, mangoes, and yams.  Dairy products. ? Choose fat-free or low-fat dairy products, such as milk, yogurt, and cheese.  Grains, bread, pasta, and rice. ? Choose whole grain products, such as multigrain bread, whole oats, and brown rice. These foods may  help control blood pressure.  Fats. ? Foods containing healthful fats, such as nuts, avocado, olive oil, canola oil, and fish.  Does everyone with diabetes mellitus have the same meal plan? Because every person with diabetes mellitus is different, there is not one meal plan that works for everyone. It is very important that you meet with a dietitian who will help you create a meal plan that is just right for you. This information is not intended to replace advice given to you by your health care provider. Make sure you discuss any questions you have with your health care provider. Document Released: 09/28/2004 Document Revised: 06/09/2015 Document Reviewed: 11/28/2012 Elsevier Interactive Patient Education  2017 [employer].

## 2016-10-27 NOTE — Progress Notes (Signed)
.   Subjective:  Patient ID: Danielle Mccall, female    DOB: [birth year]  Age: 51 y.o. MRN: [id number]  CC: Diabetes   HPI Danielle Mccall presents for diabetes follow up. She also brings employment related form. History of DM. Symptoms: none. Patient denies foot ulcerations,paresthesia of the feet. nausea, polydipsia, polyuria, visual disturbances and vomitting.  Evaluation to date has been included: fasting blood sugar, fasting lipid panel and hemoglobin A1C.  Home sugars: doesn't check sugars. Treatment to date: metformin. She reports plan to gradually increase her metformin.     Outpatient Medications Prior to Visit  Medication Sig Dispense Refill  . atorvastatin (LIPITOR) 20 MG tablet Take 1 tablet (20 mg total) by mouth daily. 30 tablet 2  . citalopram (CELEXA) 20 MG tablet Take 1 tablet (20 mg total) by mouth daily. 30 tablet 1  . metFORMIN (GLUCOPHAGE-XR) 500 MG 24 hr tablet Take 2 tablets (1,000 mg total) by mouth 2 (two) times daily with a meal. 60 tablet 2  . Elastic Bandages & Supports (MEDICAL COMPRESSION STOCKINGS) MISC APPLY ONCE TO LEFT AND RIGHT LOWER EXTREMITY FOR SWELLING AND SUPPORT. TO BE FITTED BY MEDICAL SUPPLY. 2 each 0  . glucose blood test strip Use as instructed 100 each 12  . loperamide (IMODIUM A-D) 2 MG tablet Take 1 tablet (2 mg total) by mouth 4 (four) times daily as needed for diarrhea or loose stools. 30 tablet 0  . Phenylephrine-Chlorphen-DM 05-16-13 MG/5ML SYRP Take 5 mLs by mouth every 6 (six) hours as needed. 1 Bottle 0   Facility-Administered Medications Prior to Visit  Medication Dose Route Frequency Provider Last Rate Last Dose  . insulin aspart (novoLOG) injection 10 Units  10 Units Subcutaneous Once Hairston, Mandesia R, FNP        ROS Review of Systems  Constitutional: Negative.   Respiratory: Negative.   Cardiovascular: Negative.   Gastrointestinal: Negative.   Skin: Negative.    Objective:  BP 133/82 (BP Location: Left Arm, Patient  Position: Sitting, Cuff Size: Normal)   Pulse 76   Temp 98.3 F (36.8 C) (Oral)   Resp 18   Ht  (1.727 m)   Wt 205 lb 6.4 oz (93.2 kg)   SpO2 98%   BMI 31.23 kg/m   BP/Weight 10/26/2016 10/23/2016 08/31/2016  Systolic BP 133 118 125  Diastolic BP 82 75 72  Wt. (Lbs) 205.4 203.8 207.4  BMI 31.23 30.99 31.54     Physical Exam  Constitutional: She appears well-developed and well-nourished.  Eyes: Pupils are equal, round, and reactive to light. Conjunctivae are normal.  Neck: No JVD present.  Cardiovascular: Normal rate, regular rhythm, normal heart sounds and intact distal pulses.   Pulmonary/Chest: Effort normal and breath sounds normal.  Abdominal: Soft. Bowel sounds are normal. There is no tenderness.  Skin: Skin is warm and dry.  Nursing note and vitals reviewed.  Assessment & Plan:   1. Type 2 diabetes mellitus with hyperglycemia, without long-term current use of insulin (HCC)  - metFORMIN (GLUCOPHAGE-XR) 500 MG 24 hr tablet; Take 2 tablets (1,000 mg total) by mouth 2 (two) times daily with a meal.  Dispense: 120 tablet; Refill: 3  2. Diabetes mellitus without complication (HCC)  - Glucose (CBG)     Meds ordered this encounter  Medications  . metFORMIN (GLUCOPHAGE-XR) 500 MG 24 hr tablet    Sig: Take 2 tablets (1,000 mg total) by mouth 2 (two) times daily with a meal.    Dispense:  120 tablet    Refill:  3    Order Specific Question:   Supervising Provider    Answer:   Quentin Angst [[id number]]    Follow-up: Return in about 2 months (around 12/26/2016) for DM .   Lizbeth Bark FNP

## 2016-12-03 ENCOUNTER — Other Ambulatory Visit: Payer: Self-pay | Admitting: Family Medicine

## 2016-12-03 DIAGNOSIS — F329 Major depressive disorder, single episode, unspecified: Secondary | ICD-10-CM

## 2016-12-03 DIAGNOSIS — F419 Anxiety disorder, unspecified: Principal | ICD-10-CM

## 2016-12-03 DIAGNOSIS — F32A Depression, unspecified: Secondary | ICD-10-CM

## 2016-12-14 ENCOUNTER — Telehealth: Payer: Self-pay | Admitting: Family Medicine

## 2016-12-14 ENCOUNTER — Other Ambulatory Visit: Payer: Self-pay | Admitting: Family Medicine

## 2016-12-14 DIAGNOSIS — J069 Acute upper respiratory infection, unspecified: Secondary | ICD-10-CM

## 2016-12-14 DIAGNOSIS — Z760 Encounter for issue of repeat prescription: Secondary | ICD-10-CM

## 2016-12-14 DIAGNOSIS — B9789 Other viral agents as the cause of diseases classified elsewhere: Secondary | ICD-10-CM

## 2016-12-14 MED ORDER — PHENYLEPHRINE-CHLORPHEN-DM 5-2-15 MG/5ML PO SYRP: 5.0000 mL | ORAL_SOLUTION | Freq: Four times a day (QID) | ORAL | 0 refills | Status: DC | PRN

## 2016-12-14 NOTE — Progress Notes (Unsigned)
P 

## 2016-12-14 NOTE — Telephone Encounter (Signed)
Pt was given a hard copy of Phenylephrine-Chlorphen-DM 05-16-13 MG/5ML Syrp She lost the original copy and was given a second. This message is Just to inform of the occurrence.

## 2016-12-26 ENCOUNTER — Ambulatory Visit: Payer: BC Managed Care – PPO | Admitting: Family Medicine

## 2016-12-28 ENCOUNTER — Other Ambulatory Visit: Payer: Self-pay

## 2016-12-28 ENCOUNTER — Encounter: Payer: Self-pay | Admitting: Family Medicine

## 2016-12-28 ENCOUNTER — Ambulatory Visit: Payer: BC Managed Care – PPO | Attending: Family Medicine | Admitting: Family Medicine

## 2016-12-28 VITALS — BP 133/85 | HR 75 | Temp 98.0°F | Resp 18 | Ht 67.0 in | Wt 204.8 lb

## 2016-12-28 DIAGNOSIS — F419 Anxiety disorder, unspecified: Secondary | ICD-10-CM | POA: Insufficient documentation

## 2016-12-28 DIAGNOSIS — E782 Mixed hyperlipidemia: Secondary | ICD-10-CM | POA: Insufficient documentation

## 2016-12-28 DIAGNOSIS — Z23 Encounter for immunization: Secondary | ICD-10-CM | POA: Diagnosis not present

## 2016-12-28 DIAGNOSIS — F329 Major depressive disorder, single episode, unspecified: Secondary | ICD-10-CM | POA: Diagnosis not present

## 2016-12-28 DIAGNOSIS — Z79899 Other long term (current) drug therapy: Secondary | ICD-10-CM | POA: Diagnosis not present

## 2016-12-28 DIAGNOSIS — Z7984 Long term (current) use of oral hypoglycemic drugs: Secondary | ICD-10-CM | POA: Insufficient documentation

## 2016-12-28 DIAGNOSIS — E1165 Type 2 diabetes mellitus with hyperglycemia: Secondary | ICD-10-CM | POA: Insufficient documentation

## 2016-12-28 DIAGNOSIS — E119 Type 2 diabetes mellitus without complications: Secondary | ICD-10-CM | POA: Diagnosis present

## 2016-12-28 LAB — POCT GLYCOSYLATED HEMOGLOBIN (HGB A1C): Hemoglobin A1C: 8.3

## 2016-12-28 LAB — GLUCOSE, POCT (MANUAL RESULT ENTRY): POC GLUCOSE: 112 mg/dL — AB (ref 70–99)

## 2016-12-28 MED ORDER — METFORMIN HCL ER 500 MG PO TB24: 1000.0000 mg | ORAL_TABLET | Freq: Two times a day (BID) | ORAL | 3 refills | Status: DC

## 2016-12-28 MED ORDER — GLIPIZIDE 5 MG PO TABS: 5.0000 mg | ORAL_TABLET | Freq: Every day | ORAL | 2 refills | Status: DC

## 2016-12-28 NOTE — Progress Notes (Signed)
.   Subjective:  Patient ID: Danielle Mccall, female    DOB: 02/05/1965  Age: 51 y.o. MRN: [id number]  CC: Follow-up   HPI Danielle Mccall presents for diabetes follow up. History of DM. Symptoms: none. Patient denies foot ulcerations,paresthesia of the feet. nausea, polydipsia, polyuria, visual disturbances and vomitting.  Evaluation to date has been included: fasting blood sugar, fasting lipid panel and hemoglobin A1C.  Home sugars: doesn't check sugars. Treatment to date: metformin. She is not adherent to lower carbohydrate diet. History of anxiety and depression. Denies any SI/HI. Symptoms stable, adherent with citalopram use daily. Denies any side effects. She declines to speak with the LCSW at this time.    Outpatient Medications Prior to Visit  Medication Sig Dispense Refill  . atorvastatin (LIPITOR) 20 MG tablet Take 1 tablet (20 mg total) by mouth daily. 30 tablet 2  . citalopram (CELEXA) 20 MG tablet TAKE 1 TABLET (20 MG TOTAL) BY MOUTH DAILY. 30 tablet 2  . Elastic Bandages & Supports (MEDICAL COMPRESSION STOCKINGS) MISC APPLY ONCE TO LEFT AND RIGHT LOWER EXTREMITY FOR SWELLING AND SUPPORT. TO BE FITTED BY MEDICAL SUPPLY. 2 each 0  . glucose blood test strip Use as instructed 100 each 12  . Phenylephrine-Chlorphen-DM 05-16-13 MG/5ML SYRP Take 5 mLs by mouth every 6 (six) hours as needed. 1 Bottle 0  . loperamide (IMODIUM A-D) 2 MG tablet Take 1 tablet (2 mg total) by mouth 4 (four) times daily as needed for diarrhea or loose stools. 30 tablet 0  . metFORMIN (GLUCOPHAGE-XR) 500 MG 24 hr tablet Take 2 tablets (1,000 mg total) by mouth 2 (two) times daily with a meal. 120 tablet 3   Facility-Administered Medications Prior to Visit  Medication Dose Route Frequency Provider Last Rate Last Dose  . insulin aspart (novoLOG) injection 10 Units  10 Units Subcutaneous Once Jasraj Lappe R, FNP        ROS Review of Systems  Constitutional: Negative.   Respiratory: Negative.     Cardiovascular: Negative.   Gastrointestinal: Negative.   Skin: Negative.    Objective:  BP 133/85 (BP Location: Left Arm, Patient Position: Sitting, Cuff Size: Normal)   Pulse 75   Temp 98 F (36.7 C) (Oral)   Resp 18   Ht 5\' 7"  (1.702 m)   Wt 204 lb 12.8 oz (92.9 kg)   SpO2 96%   BMI 32.08 kg/m   BP/Weight 12/28/2016 10/26/2016 10/23/2016  Systolic BP 133 133 118  Diastolic BP 85 82 75  Wt. (Lbs) 204.8 205.4 203.8  BMI 32.08 31.23 30.99     Physical Exam  Constitutional: She appears well-developed and well-nourished.  Eyes: Pupils are equal, round, and reactive to light. Conjunctivae are normal.  Neck: No JVD present.  Cardiovascular: Normal rate, regular rhythm, normal heart sounds and intact distal pulses.   Pulmonary/Chest: Effort normal and breath sounds normal.  Abdominal: Soft. Bowel sounds are normal. There is no tenderness.  Skin: Skin is warm and dry.  Nursing note and vitals reviewed.  Assessment & Plan:    1. Type 2 diabetes mellitus with hyperglycemia, without long-term current use of insulin (HCC) Glipizide added for better glucose control. Start checking CBG's at least once QD. - Glucose (CBG) - HgB A1c - metFORMIN (GLUCOPHAGE-XR) 500 MG 24 hr tablet; Take 2 tablets (1,000 mg total) by mouth 2 (two) times daily with a meal.  Dispense: 120 tablet; Refill: 3 - Ambulatory referral to Ophthalmology - glipiZIDE (GLUCOTROL) 5 MG tablet; Take  1 tablet (5 mg total) by mouth daily before breakfast.  Dispense: 30 tablet; Refill: 2  2. Mixed hyperlipidemia  - Lipid Panel  3. Needs flu shot  - Flu Vaccine QUAD 6+ mos PF IM (Fluarix Quad PF)      Follow-up: Return in about 3 months (around 03/28/2017) for DM .   Lizbeth BarkMandesia R Ardean Melroy FNP

## 2016-12-28 NOTE — Patient Instructions (Signed)
Start check blood sugars daily.  Diabetes Mellitus and Food It is important for you to manage your blood sugar (glucose) level. Your blood glucose level can be greatly affected by what you eat. Eating healthier foods in the appropriate amounts throughout the day at about the same time each day will help you control your blood glucose level. It can also help slow or prevent worsening of your diabetes mellitus. Healthy eating may even help you improve the level of your blood pressure and reach or maintain a healthy weight. General recommendations for healthful eating and cooking habits include:  Eating meals and snacks regularly. Avoid going long periods of time without eating to lose weight.  Eating a diet that consists mainly of plant-based foods, such as fruits, vegetables, nuts, legumes, and whole grains.  Using low-heat cooking methods, such as baking, instead of high-heat cooking methods, such as deep frying.  Work with your dietitian to make sure you understand how to use the Nutrition Facts information on food labels. How can food affect me? Carbohydrates Carbohydrates affect your blood glucose level more than any other type of food. Your dietitian will help you determine how many carbohydrates to eat at each meal and teach you how to count carbohydrates. Counting carbohydrates is important to keep your blood glucose at a healthy level, especially if you are using insulin or taking certain medicines for diabetes mellitus. Alcohol Alcohol can cause sudden decreases in blood glucose (hypoglycemia), especially if you use insulin or take certain medicines for diabetes mellitus. Hypoglycemia can be a life-threatening condition. Symptoms of hypoglycemia (sleepiness, dizziness, and disorientation) are similar to symptoms of having too much alcohol. If your health care provider has given you approval to drink alcohol, do so in moderation and use the following guidelines:  Women should not have more  than one drink per day, and men should not have more than two drinks per day. One drink is equal to: ? 12 oz of beer. ? 5 oz of wine. ? 1 oz of hard liquor.  Do not drink on an empty stomach.  Keep yourself hydrated. Have water, diet soda, or unsweetened iced tea.  Regular soda, juice, and other mixers might contain a lot of carbohydrates and should be counted.  What foods are not recommended? As you make food choices, it is important to remember that all foods are not the same. Some foods have fewer nutrients per serving than other foods, even though they might have the same number of calories or carbohydrates. It is difficult to get your body what it needs when you eat foods with fewer nutrients. Examples of foods that you should avoid that are high in calories and carbohydrates but low in nutrients include:  Trans fats (most processed foods list trans fats on the Nutrition Facts label).  Regular soda.  Juice.  Candy.  Sweets, such as cake, pie, doughnuts, and cookies.  Fried foods.  What foods can I eat? Eat nutrient-rich foods, which will nourish your body and keep you healthy. The food you should eat also will depend on several factors, including:  The calories you need.  The medicines you take.  Your weight.  Your blood glucose level.  Your blood pressure level.  Your cholesterol level.  You should eat a variety of foods, including:  Protein. ? Lean cuts of meat. ? Proteins low in saturated fats, such as fish, egg whites, and beans. Avoid processed meats.  Fruits and vegetables. ? Fruits and vegetables that may help control  blood glucose levels, such as apples, mangoes, and yams.  Dairy products. ? Choose fat-free or low-fat dairy products, such as milk, yogurt, and cheese.  Grains, bread, pasta, and rice. ? Choose whole grain products, such as multigrain bread, whole oats, and brown rice. These foods may help control blood pressure.  Fats. ? Foods  containing healthful fats, such as nuts, avocado, olive oil, canola oil, and fish.  Does everyone with diabetes mellitus have the same meal plan? Because every person with diabetes mellitus is different, there is not one meal plan that works for everyone. It is very important that you meet with a dietitian who will help you create a meal plan that is just right for you. This information is not intended to replace advice given to you by your health care provider. Make sure you discuss any questions you have with your health care provider. Document Released: 09/28/2004 Document Revised: 06/09/2015 Document Reviewed: 11/28/2012 Elsevier Interactive Patient Education  2017 [employer]

## 2016-12-29 LAB — LIPID PANEL
CHOL/HDL RATIO: 2.9 ratio (ref 0.0–4.4)
Cholesterol, Total: 171 mg/dL (ref 100–199)
HDL: 60 mg/dL (ref >39)
LDL Calculated: 90 mg/dL (ref 0–99)
Triglycerides: 107 mg/dL (ref 0–149)
VLDL Cholesterol Cal: 21 mg/dL (ref 5–40)

## 2017-01-03 ENCOUNTER — Other Ambulatory Visit: Payer: Self-pay | Admitting: Family Medicine

## 2017-01-03 DIAGNOSIS — E782 Mixed hyperlipidemia: Secondary | ICD-10-CM

## 2017-01-03 MED ORDER — ATORVASTATIN CALCIUM 20 MG PO TABS: 20.0000 mg | ORAL_TABLET | Freq: Every day | ORAL | 5 refills | Status: DC

## 2017-01-04 ENCOUNTER — Telehealth: Payer: Self-pay | Admitting: *Deleted

## 2017-01-04 NOTE — Telephone Encounter (Signed)
-----   Message from Lizbeth BarkMandesia R Hairston, FNP sent at 01/03/2017  1:30 PM EST ----- Cholesterol levels have improved. Continue atorvastatin.

## 2017-01-04 NOTE — Telephone Encounter (Signed)
MA unable to leave a voice message. !!Please inform patient of cholesterol improving and needing to continue with atorvastatin!!!

## 2017-02-27 ENCOUNTER — Ambulatory Visit: Payer: BC Managed Care – PPO

## 2017-03-28 ENCOUNTER — Ambulatory Visit: Payer: BC Managed Care – PPO | Admitting: Family Medicine

## 2017-03-29 ENCOUNTER — Ambulatory Visit: Payer: BC Managed Care – PPO | Admitting: Nurse Practitioner

## 2017-04-24 ENCOUNTER — Ambulatory Visit: Payer: BC Managed Care – PPO | Admitting: Nurse Practitioner

## 2017-05-07 ENCOUNTER — Ambulatory Visit (HOSPITAL_COMMUNITY)
Admission: EM | Admit: 2017-05-07 | Discharge: 2017-05-07 | Disposition: A | Discharge status: Home / Self Care | Payer: BC Managed Care – PPO | Attending: Family Medicine | Admitting: Family Medicine

## 2017-05-07 ENCOUNTER — Encounter (HOSPITAL_COMMUNITY): Payer: Self-pay | Admitting: Emergency Medicine

## 2017-05-07 DIAGNOSIS — R600 Localized edema: Secondary | ICD-10-CM | POA: Diagnosis not present

## 2017-05-07 DIAGNOSIS — I8393 Asymptomatic varicose veins of bilateral lower extremities: Secondary | ICD-10-CM

## 2017-05-07 MED ORDER — MEDICAL COMPRESSION STOCKINGS MISC: 0 refills | Status: DC

## 2017-05-07 NOTE — ED Triage Notes (Signed)
Pt sts bilateral leg swelling x years after standing at work for prolonged periods of time; pt sts unable to go to work last week one day due to swelling and needs doctors note to return

## 2017-05-07 NOTE — Discharge Instructions (Signed)
Swelling happens when fluid collects in small spaces around tissues and organs inside the body. Another word for swelling is "edema." Some common parts of the body where people can have swelling are the lower legs or hands. This typically is worse in the areas of the body that are closest to the ground (because of gravity)  Symptoms of swelling can include puffiness of the skin, which can cause the skin to look stretched and shiny. This often occurs with swelling in the lower legs and can be worse after you sit or stand for a long time.  Treatment of edema includes several components: treatment of the underlying cause (if possible), reducing the amount of salt (sodium) in your diet, and, in many cases, use of a medication called a diuretic to eliminate excess fluid. Using compression stockings and elevating the legs may also be recommended.   

## 2017-05-08 NOTE — ED Provider Notes (Signed)
[facility]   [id number] 05/07/17 Arrival Time: 1102  ASSESSMENT & PLAN:  1. Bilateral lower extremity edema   2. Varicose veins of legs     Meds ordered this encounter  Medications  . Elastic Bandages & Supports (MEDICAL COMPRESSION STOCKINGS) MISC    Sig: APPLY ONCE TO LEFT AND RIGHT LOWER EXTREMITY FOR SWELLING AND SUPPORT. TO BE FITTED BY MEDICAL SUPPLY.    Dispense:  2 each    Refill:  0   Work note given along with Rx for compression stockings.  Reviewed expectations re: course of current medical issues. Questions answered. Outlined signs and symptoms indicating need for more acute intervention. Patient verbalized understanding. After Visit Summary given.   SUBJECTIVE: History from: patient. Danielle Mccall is a 52 y.o. female who presents requesting a note to return to work. H/O LE edema for many years with infrequent exacerbations. "Very painful when they do swell. Had to miss work." Reports no current LE edema. Requires note stating that she may return to work. No other current concerns.  ROS: As per HPI.   OBJECTIVE:  Vitals:   05/07/17 1143  BP: (!) 150/80  Pulse: 82  Resp: 18  Temp: 98.6 F (37 C)  TempSrc: Oral  SpO2: 98%    General appearance: alert; no distress Lungs: clear to auscultation bilaterally Heart: regular rate and rhythm Extremities: no cyanosis or edema over her baseline; symmetrical with no gross deformities Skin: warm and dry Neurologic: normal gait; normal symmetric reflexes Psychological: alert and cooperative; normal mood and affect  No Known Allergies  Past Medical History:  Diagnosis Date  . Diabetes mellitus without complication (HCC)   . Leg pain, left    Social History   Socioeconomic History  . Marital status: Married    Spouse name: Not on file  . Number of children: Not on file  . Years of education: Not on file  . Highest education level: Not on file  Occupational History  . Not on file    Social Needs  . Financial resource strain: Not on file  . Food insecurity:    Worry: Not on file    Inability: Not on file  . Transportation needs:    Medical: Not on file    Non-medical: Not on file  Tobacco Use  . Smoking status: Current Every Day Smoker    Packs/day: 1.00    Years: 26.00    Pack years: 26.00    Types: Cigarettes  . Smokeless tobacco: Never Used  Substance and Sexual Activity  . Alcohol use: Yes    Comment: on rare occasssions  . Drug use: No  . Sexual activity: Not on file  Lifestyle  . Physical activity:    Days per week: Not on file    Minutes per session: Not on file  . Stress: Not on file  Relationships  . Social connections:    Talks on phone: Not on file    Gets together: Not on file    Attends religious service: Not on file    Active member of club or organization: Not on file    Attends meetings of clubs or organizations: Not on file    Relationship status: Not on file  . Intimate partner violence:    Fear of current or ex partner: Not on file    Emotionally abused: Not on file    Physically abused: Not on file    Forced sexual activity: Not on file  Other Topics Concern  .  Not on file  Social History Narrative  . Not on file   History reviewed. No pertinent family history. Past Surgical History:  Procedure Laterality Date  . CESAREAN SECTION  2000  . CHOLECYSTECTOMY  Nettie Elm, MD 05/08/17 [phone]

## 2017-05-22 ENCOUNTER — Ambulatory Visit: Payer: Self-pay | Admitting: Internal Medicine

## 2017-06-23 DIAGNOSIS — E669 Obesity, unspecified: Secondary | ICD-10-CM | POA: Insufficient documentation

## 2017-06-23 DIAGNOSIS — E785 Hyperlipidemia, unspecified: Secondary | ICD-10-CM

## 2017-06-23 DIAGNOSIS — F324 Major depressive disorder, single episode, in partial remission: Secondary | ICD-10-CM | POA: Insufficient documentation

## 2017-06-23 DIAGNOSIS — E1169 Type 2 diabetes mellitus with other specified complication: Secondary | ICD-10-CM | POA: Insufficient documentation

## 2017-06-23 DIAGNOSIS — I10 Essential (primary) hypertension: Secondary | ICD-10-CM | POA: Insufficient documentation

## 2017-06-23 NOTE — Progress Notes (Deleted)
Encounter to establish care with new office and chronic care management  Assessment and Plan:  Diagnoses and all orders for this visit:  Encounter to establish care with new doctor  Type 2 diabetes mellitus with hyperglycemia, without long-term current use of insulin (HCC)  Elevated BP without diagnosis of hypertension  Hyperlipidemia associated with type 2 diabetes mellitus (HCC)  Obesity (BMI 30.0-34.9)  Major depressive disorder in partial remission, unspecified whether recurrent (HCC)     Discussed med's effects and SE's. Screening labs and tests as requested with regular follow-up as recommended. Over 40 minutes of exam, counseling, chart review, and complex, high level critical decision making was performed this visit.   Future Appointments  Date Time Provider Department Center  06/24/2017  3:00 PM Judd Gaudier, NP [facility] None     HPI  52 y.o. female  presents to establish care and has Type 2 diabetes mellitus with hyperglycemia, without long-term current use of insulin (HCC); Elevated BP without diagnosis of hypertension; Hyperlipidemia associated with type 2 diabetes mellitus (HCC); Obesity (BMI 30.0-34.9); and Major depression in partial remission (HCC) on their problem list.   ***  BMI is There is no height or weight on file to calculate BMI., she {HAS HAS WUJ:81191} been working on diet and exercise. Wt Readings from Last 3 Encounters:  12/28/16 204 lb 12.8 oz (92.9 kg)  10/26/16 205 lb 6.4 oz (93.2 kg)  10/23/16 203 lb 12.8 oz (92.4 kg)   Today their BP is   She {DOES_DOES YNW:29562} workout. She denies chest pain, shortness of breath, dizziness.   She is on cholesterol medication (atorvastatin 20 mg daily) and denies myalgias. Her cholesterol is at goal. The cholesterol last visit was:   Lab Results  Component Value Date   CHOL 171 12/28/2016   HDL 60 12/28/2016   LDLCALC 90 12/28/2016   TRIG 107 12/28/2016   CHOLHDL 2.9 12/28/2016   She  {Has/has not:18111} been working on diet and exercise for T2DM, she {ACTION; IS/IS NOT:21021397} on bASA, she {ACTION; IS/IS NOT:21021397} on ACE/ARB and denies {Symptoms; diabetes w/o none:19199}. Last A1C in the office was:  Lab Results  Component Value Date   HGBA1C 8.3 12/28/2016   Last GFR: Lab Results  Component Value Date   GFRNONAA 105 08/31/2016    No results found for: VD25OH    Current Medications:  Current Outpatient Medications on File Prior to Visit  Medication Sig Dispense Refill  . atorvastatin (LIPITOR) 20 MG tablet Take 1 tablet (20 mg total) by mouth daily. 30 tablet 5  . citalopram (CELEXA) 20 MG tablet TAKE 1 TABLET (20 MG TOTAL) BY MOUTH DAILY. 30 tablet 2  . Elastic Bandages & Supports (MEDICAL COMPRESSION STOCKINGS) MISC APPLY ONCE TO LEFT AND RIGHT LOWER EXTREMITY FOR SWELLING AND SUPPORT. TO BE FITTED BY MEDICAL SUPPLY. 2 each 0  . glipiZIDE (GLUCOTROL) 5 MG tablet Take 1 tablet (5 mg total) by mouth daily before breakfast. 30 tablet 2  . glucose blood test strip Use as instructed 100 each 12  . metFORMIN (GLUCOPHAGE-XR) 500 MG 24 hr tablet Take 2 tablets (1,000 mg total) by mouth 2 (two) times daily with a meal. 120 tablet 3  . Phenylephrine-Chlorphen-DM 05-16-13 MG/5ML SYRP Take 5 mLs by mouth every 6 (six) hours as needed. 1 Bottle 0   Current Facility-Administered Medications on File Prior to Visit  Medication Dose Route Frequency Provider Last Rate Last Dose  . insulin aspart (novoLOG) injection 10 Units  10 Units Subcutaneous Once  [name], Mandesia R, FNP       Allergies:  No Known Allergies Medical History:  She has Type 2 diabetes mellitus with hyperglycemia, without long-term current use of insulin (HCC); Elevated BP without diagnosis of hypertension; Hyperlipidemia associated with type 2 diabetes mellitus (HCC); Obesity (BMI 30.0-34.9); and Major depression in partial remission (HCC) on their problem list. Health Maintenance:   Immunization History   Administered Date(s) Administered  . Influenza,inj,Quad PF,6+ Mos 12/28/2016  . Tdap 10/26/2016    Patient Care Team: Lucky CowboyMcKeown, William, MD as PCP - General (Internal Medicine)  Surgical History:  She has a past surgical history that includes Cholecystectomy (2005) and Cesarean section (2000). Family History:  Herfamily history is not on file. Social History:  She reports that she has been smoking cigarettes.  She has a 26.00 pack-year smoking history. She has never used smokeless tobacco. She reports that she drinks alcohol. She reports that she does not use drugs.  Review of Systems: Review of Systems  Constitutional: Negative for malaise/fatigue and weight loss.  HENT: Negative for hearing loss and tinnitus.   Eyes: Negative for blurred vision and double vision.  Respiratory: Negative for cough, sputum production, shortness of breath and wheezing.   Cardiovascular: Negative for chest pain, palpitations, orthopnea, claudication, leg swelling and PND.  Gastrointestinal: Negative for abdominal pain, blood in stool, constipation, diarrhea, heartburn, melena, nausea and vomiting.  Genitourinary: Negative.   Musculoskeletal: Negative for falls, joint pain and myalgias.  Skin: Negative for rash.  Neurological: Negative for dizziness, tingling, sensory change, weakness and headaches.  Endo/Heme/Allergies: Negative for polydipsia.  Psychiatric/Behavioral: Negative.  Negative for depression, memory loss, substance abuse and suicidal ideas. The patient is not nervous/anxious and does not have insomnia.   All other systems reviewed and are negative.   Physical Exam: Estimated body mass index is 32.08 kg/m as calculated from the following:   Height as of 12/28/16: 5\' 7"  (1.702 m).   Weight as of 12/28/16: 204 lb 12.8 oz (92.9 kg). There were no vitals taken for this visit. General Appearance: Well nourished, in no apparent distress.  Eyes: PERRLA, EOMs, conjunctiva no swelling or  erythema, normal fundi and vessels.  Sinuses: No Frontal/maxillary tenderness  ENT/Mouth: Ext aud canals clear, normal light reflex with TMs without erythema, bulging. Good dentition. No erythema, swelling, or exudate on post pharynx. Tonsils not swollen or erythematous. Hearing normal.  Neck: Supple, thyroid normal. No bruits  Respiratory: Respiratory effort normal, BS equal bilaterally without rales, rhonchi, wheezing or stridor.  Cardio: RRR without murmurs, rubs or gallops. Brisk peripheral pulses without edema.  Chest: symmetric, with normal excursions and percussion.  Breasts: Symmetric, without lumps, nipple discharge, retractions.  Abdomen: Soft, nontender, no guarding, rebound, hernias, masses, or organomegaly.  Lymphatics: Non tender without lymphadenopathy.  Genitourinary:  Musculoskeletal: Full ROM all peripheral extremities,5/5 strength, and normal gait.  Skin: Warm, dry without rashes, lesions, ecchymosis. Neuro: Cranial nerves intact, reflexes equal bilaterally. Normal muscle tone, no cerebellar symptoms. Sensation intact.  Psych: Awake and oriented X 3, normal affect, Insight and Judgment appropriate.    Danielle Mccall 11:02 AM [facility]

## 2017-06-24 ENCOUNTER — Ambulatory Visit: Payer: Self-pay | Admitting: Adult Health

## 2017-07-16 NOTE — Progress Notes (Deleted)
Encounter to establish care with new office and chronic care management  Assessment and Plan:  Diagnoses and all orders for this visit:  Encounter for routine adult health examination with abnormal findings  Elevated BP without diagnosis of hypertension  Type 2 diabetes mellitus with hyperglycemia, without long-term current use of insulin (HCC)  Hyperlipidemia associated with type 2 diabetes mellitus (HCC)  Obesity (BMI 30.0-34.9)  Major depressive disorder in partial remission, unspecified whether recurrent (HCC)   No orders of the defined types were placed in this encounter.    Discussed med's effects and SE's. Screening labs and tests as requested with regular follow-up as recommended. Over 40 minutes of exam, counseling, chart review, and complex, high level critical decision making was performed this visit.   Future Appointments  Date Time Provider Department Center  07/17/2017  3:00 PM Judd Gaudier, NP [facility] None  07/21/2018  3:00 PM Judd Gaudier, NP [facility] None    HPI  52 y.o. female  presents to establish care and for CPE. She has Type 2 diabetes mellitus with hyperglycemia, without long-term current use of insulin (HCC); Elevated BP without diagnosis of hypertension; Hyperlipidemia associated with type 2 diabetes mellitus (HCC); Obesity (BMI 30.0-34.9); and Major depression in partial remission (HCC) on their problem list. She has no complaints today.   ***  She has major depression in partial remission on celexa   BMI is There is no height or weight on file to calculate BMI., she {HAS HAS ZOX:09604} been working on diet and exercise. Wt Readings from Last 3 Encounters:  12/28/16 204 lb 12.8 oz (92.9 kg)  10/26/16 205 lb 6.4 oz (93.2 kg)  10/23/16 203 lb 12.8 oz (92.4 kg)   Today their BP is   She {DOES_DOES VWU:98119} workout. She denies chest pain, shortness of breath, dizziness.   She is on cholesterol medication (atorvastatin 20 mg daily) and  denies myalgias. Her cholesterol is at goal. The cholesterol last visit was:   Lab Results  Component Value Date   CHOL 171 12/28/2016   HDL 60 12/28/2016   LDLCALC 90 12/28/2016   TRIG 107 12/28/2016   CHOLHDL 2.9 12/28/2016   She {Has/has not:18111} been working on diet and exercise for T2DM, she {ACTION; IS/IS NOT:21021397} on bASA, she {ACTION; IS/IS NOT:21021397} on ACE/ARB and denies {Symptoms; diabetes w/o none:19199}. Last A1C in the office was:  Lab Results  Component Value Date   HGBA1C 8.3 12/28/2016   Last GFR: Lab Results  Component Value Date   GFRNONAA 105 08/31/2016    No results found for: VD25OH    Current Medications:  Current Outpatient Medications on File Prior to Visit  Medication Sig Dispense Refill  . atorvastatin (LIPITOR) 20 MG tablet Take 1 tablet (20 mg total) by mouth daily. 30 tablet 5  . citalopram (CELEXA) 20 MG tablet TAKE 1 TABLET (20 MG TOTAL) BY MOUTH DAILY. 30 tablet 2  . Elastic Bandages & Supports (MEDICAL COMPRESSION STOCKINGS) MISC APPLY ONCE TO LEFT AND RIGHT LOWER EXTREMITY FOR SWELLING AND SUPPORT. TO BE FITTED BY MEDICAL SUPPLY. 2 each 0  . glipiZIDE (GLUCOTROL) 5 MG tablet Take 1 tablet (5 mg total) by mouth daily before breakfast. 30 tablet 2  . glucose blood test strip Use as instructed 100 each 12  . metFORMIN (GLUCOPHAGE-XR) 500 MG 24 hr tablet Take 2 tablets (1,000 mg total) by mouth 2 (two) times daily with a meal. 120 tablet 3  . Phenylephrine-Chlorphen-DM 05-16-13 MG/5ML SYRP Take 5 mLs by mouth  every 6 (six) hours as needed. 1 Bottle 0   Current Facility-Administered Medications on File Prior to Visit  Medication Dose Route Frequency Provider Last Rate Last Dose  . insulin aspart (novoLOG) injection 10 Units  10 Units Subcutaneous Once Arrie SenateHairston, Mandesia R, FNP       Allergies:  No Known Allergies Medical History:  She has Type 2 diabetes mellitus with hyperglycemia, without long-term current use of insulin (HCC); Elevated BP  without diagnosis of hypertension; Hyperlipidemia associated with type 2 diabetes mellitus (HCC); Obesity (BMI 30.0-34.9); and Major depression in partial remission (HCC) on their problem list. Health Maintenance:   Immunization History  Administered Date(s) Administered  . Influenza,inj,Quad PF,6+ Mos 12/28/2016  . Tdap 10/26/2016    Tetanus: 2018 Pneumovax: - Prevnar 13: - Flu vaccine: 2018 Zostavax: -  LMP: No LMP recorded. Pap: 2012 *** MGM:  DEXA:  Colonoscopy: start next year EGD: -   Last Dental Exam: Last Eye Exam:  Derm?    Patient Care Team: Lucky CowboyMcKeown, William, MD as PCP - General (Internal Medicine)  Surgical History:  She has a past surgical history that includes Cholecystectomy (2005) and Cesarean section (2000). Family History:  Herfamily history is not on file. Social History:  She reports that she has been smoking cigarettes.  She has a 26.00 pack-year smoking history. She has never used smokeless tobacco. She reports that she drinks alcohol. She reports that she does not use drugs.  Review of Systems: Review of Systems  Constitutional: Negative for malaise/fatigue and weight loss.  HENT: Negative for hearing loss and tinnitus.   Eyes: Negative for blurred vision and double vision.  Respiratory: Negative for cough, sputum production, shortness of breath and wheezing.   Cardiovascular: Negative for chest pain, palpitations, orthopnea, claudication, leg swelling and PND.  Gastrointestinal: Negative for abdominal pain, blood in stool, constipation, diarrhea, heartburn, melena, nausea and vomiting.  Genitourinary: Negative.   Musculoskeletal: Negative for falls, joint pain and myalgias.  Skin: Negative for rash.  Neurological: Negative for dizziness, tingling, sensory change, weakness and headaches.  Endo/Heme/Allergies: Negative for polydipsia.  Psychiatric/Behavioral: Negative.  Negative for depression, memory loss, substance abuse and suicidal ideas.  The patient is not nervous/anxious and does not have insomnia.   All other systems reviewed and are negative.   Physical Exam: Estimated body mass index is 32.08 kg/m as calculated from the following:   Height as of 12/28/16: 5\' 7"  (1.702 m).   Weight as of 12/28/16: 204 lb 12.8 oz (92.9 kg). There were no vitals taken for this visit. General Appearance: Well nourished, in no apparent distress.  Eyes: PERRLA, EOMs, conjunctiva no swelling or erythema, normal fundi and vessels.  Sinuses: No Frontal/maxillary tenderness  ENT/Mouth: Ext aud canals clear, normal light reflex with TMs without erythema, bulging. Good dentition. No erythema, swelling, or exudate on post pharynx. Tonsils not swollen or erythematous. Hearing normal.  Neck: Supple, thyroid normal. No bruits  Respiratory: Respiratory effort normal, BS equal bilaterally without rales, rhonchi, wheezing or stridor.  Cardio: RRR without murmurs, rubs or gallops. Brisk peripheral pulses without edema.  Chest: symmetric, with normal excursions and percussion.  Breasts: Symmetric, without lumps, nipple discharge, retractions.  Abdomen: Soft, nontender, no guarding, rebound, hernias, masses, or organomegaly.  Lymphatics: Non tender without lymphadenopathy.  Genitourinary:  Musculoskeletal: Full ROM all peripheral extremities,5/5 strength, and normal gait.  Skin: Warm, dry without rashes, lesions, ecchymosis. Neuro: Cranial nerves intact, reflexes equal bilaterally. Normal muscle tone, no cerebellar symptoms. Sensation intact.  Psych: Awake  and oriented X 3, normal affect, Insight and Judgment appropriate.    Danielle Mccall 1:26 PM [facility]

## 2017-07-17 ENCOUNTER — Encounter: Payer: Self-pay | Admitting: Adult Health

## 2017-08-30 NOTE — Progress Notes (Signed)
Complete Physical  Assessment and Plan:   Encounter for medical examination to establish care Follow up in 3 months for CPE  Type 2 diabetes mellitus with hyperglycemia, without long-term current use of insulin [facility](HCC) Education: Reviewed 'ABCs' of diabetes management (respective goals in parentheses):  A1C (<7), blood pressure (<130/80), and cholesterol (LDL <70) Eye Exam yearly and Dental Exam every 6 months- requested to forward diabetic eye exam report Dietary recommendations Physical Activity recommendations TRY TO ADD ON WALKING, START LOW AT 20 MINS 2 DAYS A WEEK -     COMPLETE METABOLIC PANEL WITH GFR -     Hemoglobin A1c  Hyperlipidemia associated with type 2 diabetes mellitus (HCC) Has been statin intolerant; if elevated, discussed will try very low dose pravastatin 20 mg every other day, but d/c if having myalgias.  Continue low cholesterol diet and exercise.  Check lipid panel.  -     Lipid panel -     TSH  Obesity (BMI 30.0-34.9) Long discussion about weight loss, diet, and exercise Recommended diet heavy in fruits and veggies and low in animal meats, cheeses, and dairy products, appropriate calorie intake Discussed appropriate weight for height and initial goal (215 lb) Follow up at next visit  Major depressive disorder in partial remission, unspecified whether recurrent (HCC) -     escitalopram (LEXAPRO) 10 MG tablet; Take 1 tablet (10 mg total) by mouth daily.  Elevated BP without diagnosis of hypertension Controlled off of medications Monitor blood pressure at home; call if consistently over 130/80 Continue DASH diet.   Reminder to go to the ER if any CP, SOB, nausea, dizziness, severe HA, changes vision/speech, left arm numbness and tingling and jaw pain.  Medication management -     CBC with Differential/Platelet -     COMPLETE METABOLIC PANEL WITH GFR -     Magnesium  Former smoker 30+ year smoking hx; will discuss low dose screening CT at  CPE   Discussed med's effects and SE's. Screening labs and tests as requested with regular follow-up as recommended. Over 40 minutes of exam, counseling, chart review, and complex, high level critical decision making was performed this visit.   Future Appointments  Date Time Provider Department Center  09/08/2018  3:00 PM Judd Gaudierorbett, Estus Krakowski, NP [facility] None    HPI  52 y.o. female  presents to establish care. She has Type 2 diabetes mellitus with hyperglycemia, without long-term current use of insulin (HCC); Elevated BP without diagnosis of hypertension; Hyperlipidemia associated with type 2 diabetes mellitus (HCC); Obesity (BMI 30.0-34.9); and Major depression in partial remission (HCC) on their problem list. 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 She was adopted. She is married with 1 living child (lost son to rare renal disease).   She does have hx of recurrent major depression; previously controlled on zoloft, but has been on celexa most recently which she has self tapered off of. She would like to restart treatment today. She reports perimenopausal hotflashes that are new, accompanied by mood swings and frequent crying for no reason. Hot falshes are improved on black cohosh.   She does have some intermittent mid back pain improved with chiropractic treatments.   BMI is Body mass index is 35.79 kg/m., she has not been working on diet and exercise. 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 Prefers not to use medication.  Wt Readings from Last 3 Encounters:  09/02/17 235 lb 6.4 oz (106.8 kg)  12/28/16 204 lb 12.8 oz (92.9 kg)  10/26/16 205 lb 6.4 oz (93.2 kg)   Today their BP is BP: 128/72 She does not workout. She denies chest pain, shortness of breath, dizziness.   She is not on cholesterol medication (was on atorvastatin 20 mg daily as of last check and developed myalgias). Her cholesterol is not at goal. The cholesterol last visit was:   Lab  Results  Component Value Date   CHOL 171 12/28/2016   HDL 60 12/28/2016   LDLCALC 90 12/28/2016   TRIG 107 12/28/2016   CHOLHDL 2.9 12/28/2016   She has been working on diet and exercise for T2DM (on metformin ER 1000 mg BID, she was taking glipizide 5 mg AM but has having hypoglycemia), she is not on bASA, she is not on ACE/ARB and denies foot ulcerations, increased appetite, nausea, paresthesia of the feet, polydipsia, polyuria, visual disturbances, vomiting and weight loss. She does check an occasional sugar but not checking consistently.  Last A1C in the office was:  Lab Results  Component Value Date   HGBA1C 8.3 12/28/2016   Last GFR: Lab Results  Component Value Date   GFRNONAA 105 08/31/2016   Patient is not currently on Vitamin D supplement.   No results found for: VD25OH    Current Medications:  Current Outpatient Medications on File Prior to Visit  Medication Sig Dispense Refill  . BLACK COHOSH EXTRACT PO Take 1 capsule by mouth daily.    Clinical research associate. Elastic Bandages & Supports (MEDICAL COMPRESSION STOCKINGS) MISC APPLY ONCE TO LEFT AND RIGHT LOWER EXTREMITY FOR SWELLING AND SUPPORT. TO BE FITTED BY MEDICAL SUPPLY. 2 each 0  . glucose blood test strip Use as instructed 100 each 12  . metFORMIN (GLUCOPHAGE-XR) 500 MG 24 hr tablet Take 2 tablets (1,000 mg total) by mouth 2 (two) times daily with a meal. 120 tablet 3   No current facility-administered medications on file prior to visit.    Allergies:  Allergies  Allergen Reactions  . Statins Other (See Comments)    myalgias   Medical History:  She has Type 2 diabetes mellitus with hyperglycemia, without long-term current use of insulin (HCC); Elevated BP without diagnosis of hypertension; Hyperlipidemia associated with type 2 diabetes mellitus (HCC); Obesity (BMI 30.0-34.9); and Major depression in partial remission (HCC) on their problem list. Health Maintenance:   Immunization History  Administered Date(s) Administered  .  Influenza,inj,Quad PF,6+ Mos 12/28/2016  . Tdap 10/26/2016    Tetanus: 2018 Pneumovax: - Prevnar 13: - Flu vaccine: 2018 Zostavax: -  LMP: No LMP recorded. Pap: 2017? Has GYN - Dr. Theresa Mulliganichard Tavon - [facility] MGM: 2017 - by Dr. Rosemary Holmsavon, has next scheduled DEXA:   Colonoscopy: -  EGD:   [facility] optometrist this year, needs to see opthal   Patient Care Team: Lucky CowboyMcKeown, William, MD as PCP - General (Internal Medicine)  Surgical History:  She has a past surgical history that includes Cholecystectomy (2005) and Cesarean section (2000). Family History:  Herfamily history includes Renal Disease in her son. She was adopted. Social History:  She reports that she quit smoking about 4 months ago. Her smoking use included cigarettes. She has a 32.00 pack-year smoking history. She has never used smokeless tobacco. She reports that she drinks alcohol. She reports that she does not use drugs.  Review of Systems: Review of Systems  Constitutional: Negative for malaise/fatigue and weight loss.  HENT: Negative for hearing loss and tinnitus.   Eyes:  Negative for blurred vision and double vision.  Respiratory: Negative for cough, shortness of breath and wheezing.   Cardiovascular: Negative for chest pain, palpitations, orthopnea, claudication and leg swelling.  Gastrointestinal: Negative for abdominal pain, blood in stool, constipation, diarrhea, heartburn, melena, nausea and vomiting.  Genitourinary: Negative.   Musculoskeletal: Negative for joint pain and myalgias.  Skin: Negative for rash.  Neurological: Negative for dizziness, tingling, sensory change, weakness and headaches.  Endo/Heme/Allergies: Negative for polydipsia.  Psychiatric/Behavioral: Positive for depression. Negative for hallucinations, substance abuse and suicidal ideas. The patient is not nervous/anxious and does not have insomnia.   All other systems reviewed and are  negative.   Physical Exam: Estimated body mass index is 35.79 kg/m as calculated from the following:   Height as of this encounter: 5\' 8"  (1.727 m).   Weight as of this encounter: 235 lb 6.4 oz (106.8 kg). BP 128/72   Pulse 92   Temp (!) 97.2 F (36.2 C)   Resp 18   Ht 5\' 8"  (1.727 m)   Wt 235 lb 6.4 oz (106.8 kg)   BMI 35.79 kg/m  General Appearance: Well nourished, in no apparent distress.  Eyes: PERRLA, EOMs, conjunctiva no swelling or erythema, normal fundi and vessels.  Sinuses: No Frontal/maxillary tenderness  ENT/Mouth: Ext aud canals clear, normal light reflex with TMs without erythema, bulging. Good dentition. No erythema, swelling, or exudate on post pharynx. Tonsils not swollen or erythematous. Hearing normal.  Neck: Supple, thyroid normal. No bruits  Respiratory: Respiratory effort normal, BS equal bilaterally without rales, rhonchi, wheezing or stridor.  Cardio: RRR without murmurs, rubs or gallops. Brisk peripheral pulses without edema.  Chest: symmetric, with normal excursions and percussion.  Breasts: Defer to GYN Abdomen: Soft, nontender, no guarding, rebound, hernias, masses, or organomegaly.  Lymphatics: Non tender without lymphadenopathy.  Genitourinary: Defer to GYN Musculoskeletal: Full ROM all peripheral extremities,5/5 strength, and normal gait.  Skin: Warm, dry without rashes, lesions, ecchymosis. Neuro: Cranial nerves intact, reflexes equal bilaterally. Normal muscle tone, no cerebellar symptoms. Sensation intact.  Psych: Awake and oriented X 3, normal affect, Insight and Judgment appropriate.   EKG: Defer to CPE  Carlyon Shadow Argenis Kumari 5:03 PM [facility]

## 2017-09-02 ENCOUNTER — Encounter: Payer: Self-pay | Admitting: Adult Health

## 2017-09-02 ENCOUNTER — Ambulatory Visit: Payer: BC Managed Care – PPO | Admitting: Adult Health

## 2017-09-02 ENCOUNTER — Encounter (INDEPENDENT_AMBULATORY_CARE_PROVIDER_SITE_OTHER): Payer: Self-pay

## 2017-09-02 VITALS — BP 128/72 | HR 92 | Temp 97.2°F | Resp 18 | Ht 68.0 in | Wt 235.4 lb

## 2017-09-02 DIAGNOSIS — Z0000 Encounter for general adult medical examination without abnormal findings: Secondary | ICD-10-CM

## 2017-09-02 DIAGNOSIS — R030 Elevated blood-pressure reading, without diagnosis of hypertension: Secondary | ICD-10-CM

## 2017-09-02 DIAGNOSIS — E669 Obesity, unspecified: Secondary | ICD-10-CM

## 2017-09-02 DIAGNOSIS — Z79899 Other long term (current) drug therapy: Secondary | ICD-10-CM | POA: Diagnosis not present

## 2017-09-02 DIAGNOSIS — E785 Hyperlipidemia, unspecified: Secondary | ICD-10-CM

## 2017-09-02 DIAGNOSIS — E1169 Type 2 diabetes mellitus with other specified complication: Secondary | ICD-10-CM

## 2017-09-02 DIAGNOSIS — E1165 Type 2 diabetes mellitus with hyperglycemia: Secondary | ICD-10-CM | POA: Diagnosis not present

## 2017-09-02 DIAGNOSIS — F324 Major depressive disorder, single episode, in partial remission: Secondary | ICD-10-CM | POA: Diagnosis not present

## 2017-09-02 DIAGNOSIS — Z87891 Personal history of nicotine dependence: Secondary | ICD-10-CM

## 2017-09-02 MED ORDER — ESCITALOPRAM OXALATE 10 MG PO TABS: 10.0000 mg | ORAL_TABLET | Freq: Every day | ORAL | 2 refills | Status: DC

## 2017-09-02 NOTE — Patient Instructions (Addendum)
Goals    . Blood Pressure < 130/80    . HEMOGLOBIN A1C < 7.0    . Weight (lb) < 215 lb (97.5 kg)       Ask insurance about shingles vaccine coverage  Please check your sugars first thing in the morning (fasting value)   Know what a healthy weight is for you (roughly BMI <25) and aim to achieve and maintain this  Aim for 7+ servings of fruits and vegetables daily  65-80+ fluid ounces of water or unsweet tea for healthy kidneys  Limit to max 1 drink of alcohol per day; avoid smoking/tobacco  Limit animal fats in diet for cholesterol and heart health - choose grass fed whenever available  Avoid highly processed foods, and foods high in saturated/trans fats  Aim for low stress - take time to unwind and care for your mental health  Aim for 150 min of moderate intensity exercise weekly for heart health, and weights twice weekly for bone health  Aim for 7-9 hours of sleep daily      When it comes to diets, agreement about the perfect plan isn't easy to find, even among the experts. Experts at the [employer] developed an idea known as the Healthy Eating Plate. Just imagine a plate divided into logical, healthy portions.  The emphasis is on diet quality:  Load up on vegetables and fruits - one-half of your plate: Aim for color and variety, and remember that potatoes don't count.  Go for whole grains - one-quarter of your plate: Whole wheat, barley, wheat berries, quinoa, oats, brown rice, and foods made with them. If you want pasta, go with whole wheat pasta.  Protein power - one-quarter of your plate: Fish, chicken, beans, and nuts are all healthy, versatile protein sources. Limit red meat.  The diet, however, does go beyond the plate, offering a few other suggestions.  Use healthy plant oils, such as olive, canola, soy, corn, sunflower and peanut. Check the labels, and avoid partially hydrogenated oil, which have unhealthy trans fats.  If you're thirsty,  drink water. Coffee and tea are good in moderation, but skip sugary drinks and limit milk and dairy products to one or two daily servings.  The type of carbohydrate in the diet is more important than the amount. Some sources of carbohydrates, such as vegetables, fruits, whole grains, and beans-are healthier than others.  Finally, stay active.  Escitalopram tablets What is this medicine? ESCITALOPRAM (es sye TAL oh pram) is used to treat depression and certain types of anxiety. This medicine may be used for other purposes; ask your health care provider or pharmacist if you have questions. COMMON BRAND NAME(S): Lexapro What should I tell my health care provider before I take this medicine? They need to know if you have any of these conditions: -bipolar disorder or a family history of bipolar disorder -diabetes -glaucoma -heart disease -kidney or liver disease -receiving electroconvulsive therapy -seizures (convulsions) -suicidal thoughts, plans, or attempt by you or a family member -an unusual or allergic reaction to escitalopram, the related drug citalopram, other medicines, foods, dyes, or preservatives -pregnant or trying to become pregnant -breast-feeding How should I use this medicine? Take this medicine by mouth with a glass of water. Follow the directions on the prescription label. You can take it with or without food. If it upsets your stomach, take it with food. Take your medicine at regular intervals. Do not take it more often than directed. Do not stop  taking this medicine suddenly except upon the advice of your doctor. Stopping this medicine too quickly may cause serious side effects or your condition may worsen. A special MedGuide will be given to you by the pharmacist with each prescription and refill. Be sure to read this information carefully each time. Talk to your pediatrician regarding the use of this medicine in children. Special care may be needed. Overdosage: If you  think you have taken too much of this medicine contact a poison control center or emergency room at once. NOTE: This medicine is only for you. Do not share this medicine with others. What if I miss a dose? If you miss a dose, take it as soon as you can. If it is almost time for your next dose, take only that dose. Do not take double or extra doses. What may interact with this medicine? Do not take this medicine with any of the following medications: -certain medicines for fungal infections like fluconazole, itraconazole, ketoconazole, posaconazole, voriconazole -cisapride -citalopram -dofetilide -dronedarone -linezolid -MAOIs like Carbex, Eldepryl, Marplan, Nardil, and Parnate -methylene blue (injected into a vein) -pimozide -thioridazine -ziprasidone This medicine may also interact with the following medications: -alcohol -amphetamines -aspirin and aspirin-like medicines -carbamazepine -certain medicines for depression, anxiety, or psychotic disturbances -certain medicines for migraine headache like almotriptan, eletriptan, frovatriptan, naratriptan, rizatriptan, sumatriptan, zolmitriptan -certain medicines for sleep -certain medicines that treat or prevent blood clots like warfarin, enoxaparin, dalteparin -cimetidine -diuretics -fentanyl -furazolidone -isoniazid -lithium -metoprolol -NSAIDs, medicines for pain and inflammation, like ibuprofen or naproxen -other medicines that prolong the QT interval (cause an abnormal heart rhythm) -procarbazine -rasagiline -supplements like St. John's wort, kava kava, valerian -tramadol -tryptophan This list may not describe all possible interactions. Give your health care provider a list of all the medicines, herbs, non-prescription drugs, or dietary supplements you use. Also tell them if you smoke, drink alcohol, or use illegal drugs. Some items may interact with your medicine. What should I watch for while using this medicine? Tell  your doctor if your symptoms do not get better or if they get worse. Visit your doctor or health care professional for regular checks on your progress. Because it may take several weeks to see the full effects of this medicine, it is important to continue your treatment as prescribed by your doctor. Patients and their families should watch out for new or worsening thoughts of suicide or depression. Also watch out for sudden changes in feelings such as feeling anxious, agitated, panicky, irritable, hostile, aggressive, impulsive, severely restless, overly excited and hyperactive, or not being able to sleep. If this happens, especially at the beginning of treatment or after a change in dose, call your health care professional. Bonita QuinYou may get drowsy or dizzy. Do not drive, use machinery, or do anything that needs mental alertness until you know how this medicine affects you. Do not stand or sit up quickly, especially if you are an older patient. This reduces the risk of dizzy or fainting spells. Alcohol may interfere with the effect of this medicine. Avoid alcoholic drinks. Your mouth may get dry. Chewing sugarless gum or sucking hard candy, and drinking plenty of water may help. Contact your doctor if the problem does not go away or is severe. What side effects may I notice from receiving this medicine? Side effects that you should report to your doctor or health care professional as soon as possible: -allergic reactions like skin rash, itching or hives, swelling of the face, lips, or tongue -anxious -black,  tarry stools -changes in vision -confusion -elevated mood, decreased need for sleep, racing thoughts, impulsive behavior -eye pain -fast, irregular heartbeat -feeling faint or lightheaded, falls -feeling agitated, angry, or irritable -hallucination, loss of contact with reality -loss of balance or coordination -loss of memory -painful or prolonged erections -restlessness, pacing, inability to keep  still -seizures -stiff muscles -suicidal thoughts or other mood changes -trouble sleeping -unusual bleeding or bruising -unusually weak or tired -vomiting Side effects that usually do not require medical attention (report to your doctor or health care professional if they continue or are bothersome): -changes in appetite -change in sex drive or performance -headache -increased sweating -indigestion, nausea -tremors This list may not describe all possible side effects. Call your doctor for medical advice about side effects. You may report side effects to FDA at 1-800-FDA-1088. Where should I keep my medicine? Keep out of reach of children. Store at room temperature between 15 and 30 degrees C (59 and 86 degrees F). Throw away any unused medicine after the expiration date. NOTE: This sheet is a summary. It may not cover all possible information. If you have questions about this medicine, talk to your doctor, pharmacist, or health care provider.  2018 Elsevier/Gold Standard (2015-06-06 13:20:23)

## 2017-09-03 ENCOUNTER — Other Ambulatory Visit: Payer: Self-pay | Admitting: Adult Health

## 2017-09-03 LAB — LIPID PANEL
Cholesterol: 242 mg/dL — ABNORMAL HIGH (ref <200)
HDL: 61 mg/dL (ref >50)
LDL Cholesterol (Calc): 139 mg/dL (calc) — ABNORMAL HIGH
NON-HDL CHOLESTEROL (CALC): 181 mg/dL — AB (ref <130)
TRIGLYCERIDES: 296 mg/dL — AB (ref <150)
Total CHOL/HDL Ratio: 4 (calc) (ref <5.0)

## 2017-09-03 LAB — COMPLETE METABOLIC PANEL WITH GFR
AG Ratio: 1.3 (calc) (ref 1.0–2.5)
ALBUMIN MSPROF: 4 g/dL (ref 3.6–5.1)
ALT: 16 U/L (ref 6–29)
AST: 10 U/L (ref 10–35)
Alkaline phosphatase (APISO): 81 U/L (ref 33–130)
BILIRUBIN TOTAL: 0.3 mg/dL (ref 0.2–1.2)
BUN: 13 mg/dL (ref 7–25)
CHLORIDE: 100 mmol/L (ref 98–110)
CO2: 26 mmol/L (ref 20–32)
Calcium: 9.9 mg/dL (ref 8.6–10.4)
Creat: 0.67 mg/dL (ref 0.50–1.05)
GFR, Est African American: 119 mL/min/{1.73_m2} (ref >=60)
GFR, Est Non African American: 102 mL/min/{1.73_m2} (ref >=60)
GLUCOSE: 322 mg/dL — AB (ref 65–99)
Globulin: 3 g/dL (calc) (ref 1.9–3.7)
POTASSIUM: 4.7 mmol/L (ref 3.5–5.3)
Sodium: 136 mmol/L (ref 135–146)
Total Protein: 7 g/dL (ref 6.1–8.1)

## 2017-09-03 LAB — CBC WITH DIFFERENTIAL/PLATELET
Basophils Absolute: 105 cells/uL (ref 0–200)
Basophils Relative: 0.9 %
EOS PCT: 2.3 %
Eosinophils Absolute: 269 cells/uL (ref 15–500)
HEMATOCRIT: 42.1 % (ref 35.0–45.0)
Hemoglobin: 13.9 g/dL (ref 11.7–15.5)
LYMPHS ABS: 3147 {cells}/uL (ref 850–3900)
MCH: 28.5 pg (ref 27.0–33.0)
MCHC: 33 g/dL (ref 32.0–36.0)
MCV: 86.3 fL (ref 80.0–100.0)
MPV: 10.4 fL (ref 7.5–12.5)
Monocytes Relative: 6.2 %
NEUTROS ABS: 7453 {cells}/uL (ref 1500–7800)
NEUTROS PCT: 63.7 %
Platelets: 375 10*3/uL (ref 140–400)
RBC: 4.88 10*6/uL (ref 3.80–5.10)
RDW: 12 % (ref 11.0–15.0)
Total Lymphocyte: 26.9 %
WBC mixed population: 725 cells/uL (ref 200–950)
WBC: 11.7 10*3/uL — AB (ref 3.8–10.8)

## 2017-09-03 LAB — HEMOGLOBIN A1C
EAG (MMOL/L): 16.6 (calc)
HEMOGLOBIN A1C: 12.1 %{Hb} — AB (ref <5.7)
MEAN PLASMA GLUCOSE: 301 (calc)

## 2017-09-03 LAB — MAGNESIUM: Magnesium: 1.7 mg/dL (ref 1.5–2.5)

## 2017-09-03 LAB — TSH: TSH: 3.18 m[IU]/L

## 2017-09-03 MED ORDER — GLIMEPIRIDE 4 MG PO TABS: ORAL_TABLET | ORAL | 1 refills | Status: DC

## 2017-09-03 MED ORDER — PRAVASTATIN SODIUM 40 MG PO TABS: ORAL_TABLET | ORAL | 1 refills | Status: DC

## 2017-10-09 ENCOUNTER — Ambulatory Visit (HOSPITAL_COMMUNITY)
Admission: RE | Admit: 2017-10-09 | Discharge: 2017-10-09 | Disposition: A | Discharge status: Home / Self Care | Payer: BC Managed Care – PPO | Source: Ambulatory Visit | Attending: Adult Health | Admitting: Adult Health

## 2017-10-09 ENCOUNTER — Ambulatory Visit (INDEPENDENT_AMBULATORY_CARE_PROVIDER_SITE_OTHER): Payer: BC Managed Care – PPO | Admitting: Adult Health

## 2017-10-09 ENCOUNTER — Encounter: Payer: Self-pay | Admitting: Adult Health

## 2017-10-09 ENCOUNTER — Ambulatory Visit: Payer: Self-pay | Admitting: Internal Medicine

## 2017-10-09 VITALS — BP 126/74 | HR 85 | Temp 97.3°F | Ht 68.0 in | Wt 238.0 lb

## 2017-10-09 DIAGNOSIS — E1165 Type 2 diabetes mellitus with hyperglycemia: Secondary | ICD-10-CM

## 2017-10-09 DIAGNOSIS — R05 Cough: Secondary | ICD-10-CM | POA: Insufficient documentation

## 2017-10-09 DIAGNOSIS — J209 Acute bronchitis, unspecified: Secondary | ICD-10-CM

## 2017-10-09 DIAGNOSIS — F17200 Nicotine dependence, unspecified, uncomplicated: Secondary | ICD-10-CM | POA: Insufficient documentation

## 2017-10-09 DIAGNOSIS — F324 Major depressive disorder, single episode, in partial remission: Secondary | ICD-10-CM | POA: Diagnosis not present

## 2017-10-09 DIAGNOSIS — M62830 Muscle spasm of back: Secondary | ICD-10-CM | POA: Diagnosis not present

## 2017-10-09 MED ORDER — PROMETHAZINE-DM 6.25-15 MG/5ML PO SYRP: 5.0000 mL | ORAL_SOLUTION | Freq: Four times a day (QID) | ORAL | 1 refills | Status: DC | PRN

## 2017-10-09 MED ORDER — TIZANIDINE HCL 4 MG PO CAPS: 4.0000 mg | ORAL_CAPSULE | Freq: Three times a day (TID) | ORAL | 1 refills | Status: DC | PRN

## 2017-10-09 MED ORDER — PREDNISONE 20 MG PO TABS: ORAL_TABLET | ORAL | 0 refills | Status: DC

## 2017-10-09 MED ORDER — METFORMIN HCL ER 500 MG PO TB24: 1000.0000 mg | ORAL_TABLET | Freq: Two times a day (BID) | ORAL | 3 refills | Status: DC

## 2017-10-09 MED ORDER — ESCITALOPRAM OXALATE 10 MG PO TABS: 10.0000 mg | ORAL_TABLET | Freq: Every day | ORAL | 0 refills | Status: DC

## 2017-10-09 MED ORDER — AZITHROMYCIN 250 MG PO TABS: ORAL_TABLET | ORAL | 1 refills | Status: AC

## 2017-10-09 NOTE — Patient Instructions (Signed)
Acute Bronchitis, Adult Acute bronchitis is sudden (acute) swelling of the air tubes (bronchi) in the lungs. Acute bronchitis causes these tubes to fill with mucus, which can make it hard to breathe. It can also cause coughing or wheezing. In adults, acute bronchitis usually goes away within 2 weeks. A cough caused by bronchitis may last up to 3 weeks. Smoking, allergies, and asthma can make the condition worse. Repeated episodes of bronchitis may cause further lung problems, such as chronic obstructive pulmonary disease (COPD). What are the causes? This condition can be caused by germs and by substances that irritate the lungs, including:  Cold and flu viruses. This condition is most often caused by the same virus that causes a cold.  Bacteria.  Exposure to tobacco smoke, dust, fumes, and air pollution.  What increases the risk? This condition is more likely to develop in people who:  Have close contact with someone with acute bronchitis.  Are exposed to lung irritants, such as tobacco smoke, dust, fumes, and vapors.  Have a weak immune system.  Have a respiratory condition such as asthma.  What are the signs or symptoms? Symptoms of this condition include:  A cough.  Coughing up clear, yellow, or green mucus.  Wheezing.  Chest congestion.  Shortness of breath.  A fever.  Body aches.  Chills.  A sore throat.  How is this diagnosed? This condition is usually diagnosed with a physical exam. During the exam, your health care provider may order tests, such as chest X-rays, to rule out other conditions. He or she may also:  Test a sample of your mucus for bacterial infection.  Check the level of oxygen in your blood. This is done to check for pneumonia.  Do a chest X-ray or lung function testing to rule out pneumonia and other conditions.  Perform blood tests.  Your health care provider will also ask about your symptoms and medical history. How is this  treated? Most cases of acute bronchitis clear up over time without treatment. Your health care provider may recommend:  Drinking more fluids. Drinking more makes your mucus thinner, which may make it easier to breathe.  Taking a medicine for a fever or cough.  Taking an antibiotic medicine.  Using an inhaler to help improve shortness of breath and to control a cough.  Using a cool mist vaporizer or humidifier to make it easier to breathe.  Follow these instructions at home: Medicines  Take over-the-counter and prescription medicines only as told by your health care provider.  If you were prescribed an antibiotic, take it as told by your health care provider. Do not stop taking the antibiotic even if you start to feel better. General instructions  Get plenty of rest.  Drink enough fluids to keep your urine clear or pale yellow.  Avoid smoking and secondhand smoke. Exposure to cigarette smoke or irritating chemicals will make bronchitis worse. If you smoke and you need help quitting, ask your health care provider. Quitting smoking will help your lungs heal faster.  Use an inhaler, cool mist vaporizer, or humidifier as told by your health care provider.  Keep all follow-up visits as told by your health care provider. This is important. How is this prevented? To lower your risk of getting this condition again:  Wash your hands often with soap and water. If soap and water are not available, use hand sanitizer.  Avoid contact with people who have cold symptoms.  Try not to touch your hands to your   mouth, nose, or eyes.  Make sure to get the flu shot every year.  Contact a health care provider if:  Your symptoms do not improve in 2 weeks of treatment. Get help right away if:  You cough up blood.  You have chest pain.  You have severe shortness of breath.  You become dehydrated.  You faint or keep feeling like you are going to faint.  You keep vomiting.  You have a  severe headache.  Your fever or chills gets worse. This information is not intended to replace advice given to you by your health care provider. Make sure you discuss any questions you have with your health care provider. Document Released: 02/09/2004 Document Revised: 07/27/2015 Document Reviewed: 06/22/2015 Elsevier Interactive Patient Education  2018 [employer].    Tizanidine tablets or capsules What is this medicine? TIZANIDINE (tye ZAN i deen) helps to relieve muscle spasms. It may be used to help in the treatment of multiple sclerosis and spinal cord injury. This medicine may be used for other purposes; ask your health care provider or pharmacist if you have questions. COMMON BRAND NAME(S): Zanaflex What should I tell my health care provider before I take this medicine? They need to know if you have any of these conditions: -kidney disease -liver disease -low blood pressure -mental disorder -an unusual or allergic reaction to tizanidine, other medicines, lactose (tablets only), foods, dyes, or preservatives -pregnant or trying to get pregnant -breast-feeding How should I use this medicine? Take this medicine by mouth with a full glass of water. Take this medicine on an empty stomach, at least 30 minutes before or 2 hours after food. Do not take with food unless you talk with your doctor. Follow the directions on the prescription label. Take your medicine at regular intervals. Do not take your medicine more often than directed. Do not stop taking except on your doctor's advice. Suddenly stopping the medicine can be very dangerous. Talk to your pediatrician regarding the use of this medicine in children. Patients over 67 years old may have a stronger reaction and need a smaller dose. Overdosage: If you think you have taken too much of this medicine contact a poison control center or emergency room at once. NOTE: This medicine is only for you. Do not share this medicine with  others. What if I miss a dose? If you miss a dose, take it as soon as you can. If it is almost time for your next dose, take only that dose. Do not take double or extra doses. What may interact with this medicine? Do not take this medicine with any of the following medications: -ciprofloxacin -cisapride -dofetilide -dronedarone -fluvoxamine -narcotic medicines for cough -pimozide -thiabendazole -thioridazine -ziprasidone This medicine may also interact with the following medications: -acyclovir -alcohol -antihistamines for allergy, cough and cold -baclofen -certain antibiotics like levofloxacin, ofloxacin -certain medicines for anxiety or sleep -certain medicines for blood pressure, heart disease, irregular heart beat -certain medicines for depression like amitriptyline, fluoxetine, sertraline -certain medicines for seizures like phenobarbital, primidone -certain medicines for stomach problems like cimetidine, famotidine -female hormones, like estrogens or progestins and birth control pills, patches, rings, or injections -general anesthetics like halothane, isoflurane, methoxyflurane, propofol -local anesthetics like lidocaine, pramoxine, tetracaine -medicines that relax muscles for surgery -narcotic medicines for pain -other medicines that prolong the QT interval (cause an abnormal heart rhythm) -phenothiazines like chlorpromazine, mesoridazine, prochlorperazine -ticlopidine -zileuton This list may not describe all possible interactions. Give your health care provider a list of  all the medicines, herbs, non-prescription drugs, or dietary supplements you use. Also tell them if you smoke, drink alcohol, or use illegal drugs. Some items may interact with your medicine. What should I watch for while using this medicine? Tell your doctor or health care professional if your symptoms do not start to get better or if they get worse. You may get drowsy or dizzy. Do not drive, use  machinery, or do anything that needs mental alertness until you know how this medicine affects you. Do not stand or sit up quickly, especially if you are an older patient. This reduces the risk of dizzy or fainting spells. Alcohol may interfere with the effect of this medicine. Avoid alcoholic drinks. If you are taking another medicine that also causes drowsiness, you may have more side effects. Give your health care provider a list of all medicines you use. Your doctor will tell you how much medicine to take. Do not take more medicine than directed. Call emergency for help if you have problems breathing or unusual sleepiness. Your mouth may get dry. Chewing sugarless gum or sucking hard candy, and drinking plenty of water may help. Contact your doctor if the problem does not go away or is severe. What side effects may I notice from receiving this medicine? Side effects that you should report to your doctor or health care professional as soon as possible: -allergic reactions like skin rash, itching or hives, swelling of the face, lips, or tongue -breathing problems -hallucinations -signs and symptoms of liver injury like dark yellow or brown urine; general ill feeling or flu-like symptoms; light-colored stools; loss of appetite; nausea; right upper quadrant belly pain; unusually weak or tired; yellowing of the eyes or skin -signs and symptoms of low blood pressure like dizziness; feeling faint or lightheaded, falls; unusually weak or tired -unusually slow heartbeat -unusually weak or tired Side effects that usually do not require medical attention (report to your doctor or health care professional if they continue or are bothersome): -blurred vision -constipation -dizziness -dry mouth -tiredness This list may not describe all possible side effects. Call your doctor for medical advice about side effects. You may report side effects to FDA at 1-800-FDA-1088. Where should I keep my medicine? Keep  out of the reach of children. Store at room temperature between 15 and 30 degrees C (59 and 86 degrees F). Throw away any unused medicine after the expiration date. NOTE: This sheet is a summary. It may not cover all possible information. If you have questions about this medicine, talk to your doctor, pharmacist, or health care provider.  2018 Elsevier/Gold Standard (2014-10-12 13:52:12)

## 2017-10-09 NOTE — Progress Notes (Signed)
Assessment and Plan:  Danielle Mccall was seen today for cough.  Diagnoses and all orders for this visit:  Acute bronchitis, unspecified organism Suggested symptomatic OTC remedies. Nasal saline spray for congestion. Nasal steroids, allergy pill, oral steroids offered Follow up as needed. -     DG Chest 2 View; Future -     Promethazine-DM; take 1-2 teaspoons every 6 hours PRN cough -     azithromycin (ZITHROMAX) 250 MG tablet; Take 2 tablets (500 mg) on  Day 1,  followed by 1 tablet (250 mg) once daily on Days 2 through 5. -     predniSONE (DELTASONE) 20 MG tablet; 2 tablets daily for 3 days, 1 tablet daily for 4 days.  Muscle spasm of back Discussed good body mechanics at work, wearing supportive footwear Take ibuprofen/aleve with muscle relaxer in the evening, discussed cautions associated with muscle relaxer Follow up if not improving, can order imaging, or refer to PT/ortho -     tiZANidine (ZANAFLEX) 4 MG capsule; Take 1 capsule (4 mg total) by mouth 3 (three) times daily as needed for muscle spasms.  Further disposition pending results of labs. Discussed med's effects and SE's.   Over 30 minutes of exam, counseling, chart review, and critical decision making was performed.   Future Appointments  Date Time Provider Department Center  12/04/2017  3:00 PM Judd Gaudier, NP [facility] None    ------------------------------------------------------------------------------------------------------------------   HPI BP 126/74   Pulse 85   Temp (!) 97.3 F (36.3 C)   Ht 5\' 8"  (1.727 m)   Wt 238 lb (108 kg)   SpO2 96%   BMI 36.19 kg/m   52 y.o.female , T2 diabetic, recent smoker with 32 pack year hx, presents for evaluation of URI symptoms x 1 week, mild fever yesterday, was sent home from work yesterday. She reports fatigue, chest congestion, coughing up foul tasting phlegm. She denies chest pain, dyspnea, dizziness, facial/sinus pressure, nasal discharge. She reports felt worst  yesterday and feeling somewhat better today. She reports she did have a sore throat that improved after drinking herbal tea.   She reports hx of bronchitis with similar symptoms, typically resolved after taking abx. She is a former smoking, quit 6 months ago.   Last CXR remote in 2006, no known hx of COPD  She also has intermittent spastic back pain after long days at work on her feet.   Past Medical History:  Diagnosis Date  . Diabetes mellitus without complication (HCC)   . Leg pain, left      Allergies  Allergen Reactions  . Statins Other (See Comments)    myalgias    Current Outpatient Medications on File Prior to Visit  Medication Sig  . BLACK COHOSH EXTRACT PO Take 1 capsule by mouth daily.  Clinical research associate Bandages & Supports (MEDICAL COMPRESSION STOCKINGS) MISC APPLY ONCE TO LEFT AND RIGHT LOWER EXTREMITY FOR SWELLING AND SUPPORT. TO BE FITTED BY MEDICAL SUPPLY.  Marland Kitchen glimepiride (AMARYL) 4 MG tablet Take 1/2-1 tab daily with breakfast as directed for sugars. Do not take if ill or not eating.  Marland Kitchen glucose blood test strip Use as instructed  . pravastatin (PRAVACHOL) 40 MG tablet Take 1/2 tab every other day or as directed in the evening for cholesterol .   No current facility-administered medications on file prior to visit.     ROS: all negative except above.   Physical Exam:  BP 126/74   Pulse 85   Temp (!) 97.3 F (36.3 C)  Ht 5\' 8"  (1.727 m)   Wt 238 lb (108 kg)   SpO2 96%   BMI 36.19 kg/m   General Appearance: Well nourished, in no apparent distress. Eyes: PERRLA, EOMs, conjunctiva no swelling or erythema Sinuses: No Frontal/maxillary tenderness ENT/Mouth: Ext aud canals clear, TMs without erythema, bulging. No erythema, swelling, or exudate on post pharynx.  Tonsils not swollen or erythematous. Hearing normal.  Neck: Supple, thyroid normal.  Respiratory: Respiratory effort normal, BS equal bilaterally without rales, rhonchi, wheezing or stridor. Deep coarse cough  intermittently in office today.  Cardio: RRR with no MRGs. Brisk peripheral pulses without edema.  Abdomen: Soft, + BS.  Non tender Lymphatics: Non tender without lymphadenopathy.  Musculoskeletal: Symmetrical strength, normal gait. - straight leg raise. Mild paraspinal tenderness in lumbar area bilaterally.  Skin: Warm, dry without rashes, lesions, ecchymosis.  Neuro: Cranial nerves intact. Normal muscle tone, no cerebellar symptoms. Sensation intact.  Psych: Awake and oriented X 3, normal affect, Insight and Judgment appropriate.     Dan Maker, NP 3:18 PM [facility]

## 2017-12-04 ENCOUNTER — Encounter: Payer: Self-pay | Admitting: Adult Health

## 2018-02-12 DIAGNOSIS — Z87891 Personal history of nicotine dependence: Secondary | ICD-10-CM | POA: Insufficient documentation

## 2018-02-12 NOTE — Progress Notes (Signed)
Complete Physical  Assessment and Plan:   Encounter for routine medical examination with abnormal findings Patient will schedule GYN appointment with Dr. Rosemary Holmsavon, and diabetic eye exam with ophthalmology  Type 2 diabetes mellitus with hyperglycemia, without long-term current use of insulin (HCC) Currently on metformin 2000 mg daily, amaryl 4 mg daily but forgetting to take, would prefer SGLT 2 or GLP 1 agent - pending lab results Education: Reviewed 'ABCs' of diabetes management (respective goals in parentheses):  A1C (<7), blood pressure (<130/80), and cholesterol (LDL <70) Eye Exam yearly and Dental Exam every 6 months- requested to forward diabetic eye exam report Dietary recommendations Physical Activity recommendations -     COMPLETE METABOLIC PANEL WITH GFR -     Hemoglobin A1c  Hyperlipidemia associated with type 2 diabetes mellitus (HCC) Has been statin intolerant;  Will initiate zetia 10 mg daily  Continue low cholesterol diet and exercise.  Check lipid panel.  -     Lipid panel -     TSH  Obesity (BMI 30.0-34.9) Long discussion about weight loss, diet, and exercise Recommended diet heavy in fruits and veggies and low in animal meats, cheeses, and dairy products, appropriate calorie intake Discussed appropriate weight for height and initial goal (215 lb) Follow up at next visit  Major depressive disorder in partial remission, unspecified whether recurrent (HCC) Continue medications  Lifestyle discussed: diet/exerise, sleep hygiene, stress management, hydration -     escitalopram (LEXAPRO) 10 MG tablet; Take 1 tablet (10 mg total) by mouth daily.  Hypertension Start losartan/hctz 50-12.5 - start with 1/2 tab daily, increase to full tab if needed in 2 weeks for BP goal  Monitor blood pressure at home; call if consistently over 130/80 Continue DASH diet.   Reminder to go to the ER if any CP, SOB, nausea, dizziness, severe HA, changes vision/speech, left arm numbness and  tingling and jaw pain.  Medication management -     CBC with Differential/Platelet -     COMPLETE METABOLIC PANEL WITH GFR -     Magnesium  Former smoker 30+ year smoking hx; doesn't qualify for low dose CT due to age Recent CXR clear  Onychomycosis  - has tried OTC meds, willing to try Lamisil, side effects discussed, will follow up in 6 weeks for LFTs, cheapest at [employer]  Edema/venous insufficiency - elevate legs TID, increase activity, increase water, decrease sodium intake.  Wear compression socks more routinely if available. Return to the office if no change with symptoms. Check labs.  Colon cancer screening Colonoscopy- patient declines a colonoscopy even though the risks and benefits were discussed at length. Colon cancer is 3rd most diagnosed cancer and 2nd leading cause of death in both men and women 51 years of age and older. Patient understands the risk of cancer and death with declining the test however they are willing to do cologuard screening instead. They understand that this is not as sensitive or specific as a colonoscopy and they are still recommended to get a colonoscopy. The cologuard will be sent out to their house.    Orders Placed This Encounter  Procedures  . CBC with Differential/Platelet  . COMPLETE METABOLIC PANEL WITH GFR  . Magnesium  . Lipid panel  . TSH  . Hemoglobin A1c  . VITAMIN D 25 Hydroxy (Vit-D Deficiency, Fractures)  . Microalbumin / creatinine urine ratio  . Urinalysis, Routine w reflex microscopic  . Cologuard  . COMPLETE METABOLIC PANEL WITH GFR  . EKG 12-Lead   Discussed  med's effects and SE's. Screening labs and tests as requested with regular follow-up as recommended. Over 40 minutes of exam, counseling, chart review, and complex, high level critical decision making was performed this visit.   Future Appointments  Date Time Provider Department Center  03/31/2018  3:00 PM [facility] None   05/26/2018  3:45 PM Judd Gaudier, NP [facility] None  09/01/2018  3:45 PM Judd Gaudier, NP [facility] None  03/02/2019  3:00 PM Judd Gaudier, NP [facility] None    HPI  53 y.o. female  presents to establish care. She has Type 2 diabetes mellitus with hyperglycemia, without long-term current use of insulin (HCC); Hypertension; Hyperlipidemia associated with type 2 diabetes mellitus (HCC); Obesity (BMI 30.0-34.9); Major depression in partial remission (HCC); Former smoker; Onychomycosis; and Chronic venous insufficiency on their problem list.   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 She was adopted. She is married with 1 living child (lost son to rare renal disease).   She does have hx of recurrent major depression; previously controlled on zoloft, but has been on celexa most recently which she has self tapered off of, newly trying lexapro 10 mg daily since last visit but wants to switch back to celexa.   She does have some intermittent mid back pain improved with chiropractic treatments and tizanidine 4 mg at night.   She is former smoker with 30+ pack year history, quit smoking 04/2017, had CXR 09/2017 that was clear.   BMI is Body mass index is 36.79 kg/m., she has been working on diet, exercise is limited. 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 Prefers not to use medication. 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 Husband is very supportive and they are working on this together. She is following a fat restricted diet,  Wt Readings from Last 3 Encounters:  02/13/18 238 lb 6.4 oz (108.1 kg)  10/09/17 238 lb (108 kg)  09/02/17 235 lb 6.4 oz (106.8 kg)   Today their BP is BP: 140/80  She does not workout. She denies chest pain, shortness of breath, dizziness.   She is on cholesterol medication (didn't tolerate atorvastatin, pravastatin, had stiffness and joint achiness). Her cholesterol is not at goal. The  cholesterol last visit was:   Lab Results  Component Value Date   CHOL 242 (H) 09/02/2017   HDL 61 09/02/2017   LDLCALC 139 (H) 09/02/2017   TRIG 296 (H) 09/02/2017   CHOLHDL 4.0 09/02/2017   She has been working on diet and exercise for T2DM (on metformin ER 1000 mg BID, glimepiride 4 mg daily since last visit, admits forgets to take due to eating irregularly), she is on bASA, she is not on ACE/ARB, she is not on statin due to intolerance and denies foot ulcerations, increased appetite, nausea, paresthesia of the feet, polydipsia, polyuria, visual disturbances, vomiting and weight loss. She does check an occasional sugar but not checking consistently.  Last A1C in the office was:  Lab Results  Component Value Date   HGBA1C 12.1 (H) 09/02/2017   Last GFR: Lab Results  Component Value Date   GFRNONAA 102 09/02/2017   Patient is not currently on Vitamin D supplement. Will check levels today  No results found for: VD25OH    Current Medications:  Current Outpatient Medications on File Prior to Visit  Medication Sig Dispense Refill  . aspirin EC 81 MG tablet Take 81 mg by mouth daily.    Marland Kitchen glimepiride (AMARYL)  4 MG tablet Take 1/2-1 tab daily with breakfast as directed for sugars. Do not take if ill or not eating. (Patient taking differently: Take 1/2-1 tab daily with breakfast as directed for sugars. Do not take if ill or not eating. Not consistent) 90 tablet 1  . glucose blood test strip Use as instructed 100 each 12  . metFORMIN (GLUCOPHAGE-XR) 500 MG 24 hr tablet Take 2 tablets (1,000 mg total) by mouth 2 (two) times daily with a meal. 120 tablet 3  . tiZANidine (ZANAFLEX) 4 MG capsule Take 1 capsule (4 mg total) by mouth 3 (three) times daily as needed for muscle spasms. 60 capsule 1  . pravastatin (PRAVACHOL) 40 MG tablet Take 1/2 tab every other day or as directed in the evening for cholesterol . (Patient not taking: Reported on 02/13/2018) 90 tablet 1   No current  facility-administered medications on file prior to visit.    Allergies:  Allergies  Allergen Reactions  . Statins Other (See Comments)    myalgias   Medical History:  She has Type 2 diabetes mellitus with hyperglycemia, without long-term current use of insulin (HCC); Hypertension; Hyperlipidemia associated with type 2 diabetes mellitus (HCC); Obesity (BMI 30.0-34.9); Major depression in partial remission (HCC); Former smoker; Onychomycosis; and Chronic venous insufficiency on their problem list. Health Maintenance:   Immunization History  Administered Date(s) Administered  . Influenza,inj,Quad PF,6+ Mos 12/28/2016  . Tdap 10/26/2016    Tetanus: 2018 Pneumovax:  DUE - declines Prevnar 13: - Flu vaccine: 2018, declines  Zostavax: -  LMP: No LMP recorded. Patient is postmenopausal. Pap: 2017? Has GYN - Dr. Theresa Mulliganichard Tavon - [facility] - will schedule MGM: 2017 - by Dr. Rosemary Holmsavon, has next scheduled DEXA: -   Colonoscopy: DUE - will do cologuard EGD: n/a  Last Dental Exam: [facility], last exam 2020, has dental surgery Last Eye Exam: optometrist this year, needs to see opthal   Patient Care Team: Lucky CowboyMcKeown, William, MD as PCP - General (Internal Medicine)  Surgical History:  She has a past surgical history that includes Cholecystectomy (2005) and Cesarean section (2000). Family History:  Herfamily history includes Renal Disease in her son. She was adopted. Social History:  She reports that she quit smoking about 9 months ago. Her smoking use included cigarettes. She has a 32.00 pack-year smoking history. She has never used smokeless tobacco. She reports current alcohol use. She reports that she does not use drugs.  Review of Systems: Review of Systems  Constitutional: Negative for malaise/fatigue and weight loss.  HENT: Negative for hearing loss and tinnitus.   Eyes: Negative for blurred vision and double vision.  Respiratory: Negative for cough, shortness of breath and wheezing.    Cardiovascular: Positive for leg swelling (chronic, bilateral). Negative for chest pain, palpitations, orthopnea and claudication.  Gastrointestinal: Negative for abdominal pain, blood in stool, constipation, diarrhea, heartburn, melena, nausea and vomiting.  Genitourinary: Negative.   Musculoskeletal: Negative for joint pain and myalgias.  Skin: Negative for rash.  Neurological: Negative for dizziness, tingling, sensory change, weakness and headaches.  Endo/Heme/Allergies: Negative for polydipsia.  Psychiatric/Behavioral: Positive for depression. Negative for hallucinations, substance abuse and suicidal ideas. The patient is not nervous/anxious and does not have insomnia.   All other systems reviewed and are negative.   Physical Exam: Estimated body mass index is 36.79 kg/m as calculated from the following:   Height as of this encounter: 5' 7.5" (1.715 m).   Weight as of this encounter: 238 lb 6.4 oz (108.1 kg).  BP 140/80   Pulse 81   Temp (!) 97.3 F (36.3 C)   Ht 5' 7.5" (1.715 m)   Wt 238 lb 6.4 oz (108.1 kg)   SpO2 97%   BMI 36.79 kg/m  General Appearance: Well nourished, in no apparent distress.  Eyes: PERRLA, EOMs, conjunctiva no swelling or erythema, normal fundi and vessels.  Sinuses: No Frontal/maxillary tenderness  ENT/Mouth: Ext aud canals clear, normal light reflex with TMs without erythema, bulging. Good dentition. No erythema, swelling, or exudate on post pharynx. Tonsils not swollen or erythematous. Hearing normal.  Neck: Supple, thyroid normal. No bruits  Respiratory: Respiratory effort normal, BS equal bilaterally without rales, rhonchi, wheezing or stridor.  Cardio: RRR without murmurs, rubs or gallops. Brisk peripheral pulses with 1+ bilateral pitting edema.  Chest: symmetric, with normal excursions and percussion.  Breasts: Defer to GYN Abdomen: Soft, nontender, no guarding, rebound, hernias, masses, or organomegaly.  Lymphatics: Non tender without  lymphadenopathy.  Genitourinary: Defer to GYN Musculoskeletal: Full ROM all peripheral extremities, 5/5 strength, and normal gait.  Skin: Warm, dry without rashes, lesions, ecchymosis. Bilateral toe nails yellowed, brittle distally.  Neuro: Cranial nerves intact, reflexes equal bilaterally. Normal muscle tone, no cerebellar symptoms. Sensation intact.  Psych: Awake and oriented X 3, normal affect, Insight and Judgment appropriate.   EKG: NSR, No ST changes  Carlyon Shadow Khori Rosevear 5:19 PM [facility]

## 2018-02-13 ENCOUNTER — Encounter: Payer: Self-pay | Admitting: Adult Health

## 2018-02-13 ENCOUNTER — Ambulatory Visit (INDEPENDENT_AMBULATORY_CARE_PROVIDER_SITE_OTHER): Payer: BC Managed Care – PPO | Admitting: Adult Health

## 2018-02-13 VITALS — BP 140/80 | HR 81 | Temp 97.3°F | Ht 67.5 in | Wt 238.4 lb

## 2018-02-13 DIAGNOSIS — Z13220 Encounter for screening for lipoid disorders: Secondary | ICD-10-CM | POA: Diagnosis not present

## 2018-02-13 DIAGNOSIS — Z1329 Encounter for screening for other suspected endocrine disorder: Secondary | ICD-10-CM

## 2018-02-13 DIAGNOSIS — Z131 Encounter for screening for diabetes mellitus: Secondary | ICD-10-CM

## 2018-02-13 DIAGNOSIS — F324 Major depressive disorder, single episode, in partial remission: Secondary | ICD-10-CM

## 2018-02-13 DIAGNOSIS — I10 Essential (primary) hypertension: Secondary | ICD-10-CM

## 2018-02-13 DIAGNOSIS — E559 Vitamin D deficiency, unspecified: Secondary | ICD-10-CM | POA: Diagnosis not present

## 2018-02-13 DIAGNOSIS — E1165 Type 2 diabetes mellitus with hyperglycemia: Secondary | ICD-10-CM

## 2018-02-13 DIAGNOSIS — Z0001 Encounter for general adult medical examination with abnormal findings: Secondary | ICD-10-CM

## 2018-02-13 DIAGNOSIS — Z79899 Other long term (current) drug therapy: Secondary | ICD-10-CM

## 2018-02-13 DIAGNOSIS — Z0000 Encounter for general adult medical examination without abnormal findings: Secondary | ICD-10-CM

## 2018-02-13 DIAGNOSIS — E669 Obesity, unspecified: Secondary | ICD-10-CM

## 2018-02-13 DIAGNOSIS — Z136 Encounter for screening for cardiovascular disorders: Secondary | ICD-10-CM

## 2018-02-13 DIAGNOSIS — Z1211 Encounter for screening for malignant neoplasm of colon: Secondary | ICD-10-CM

## 2018-02-13 DIAGNOSIS — E1169 Type 2 diabetes mellitus with other specified complication: Secondary | ICD-10-CM

## 2018-02-13 DIAGNOSIS — Z1389 Encounter for screening for other disorder: Secondary | ICD-10-CM

## 2018-02-13 DIAGNOSIS — Z87891 Personal history of nicotine dependence: Secondary | ICD-10-CM

## 2018-02-13 DIAGNOSIS — E785 Hyperlipidemia, unspecified: Secondary | ICD-10-CM

## 2018-02-13 DIAGNOSIS — L608 Other nail disorders: Secondary | ICD-10-CM | POA: Insufficient documentation

## 2018-02-13 DIAGNOSIS — R030 Elevated blood-pressure reading, without diagnosis of hypertension: Secondary | ICD-10-CM | POA: Diagnosis not present

## 2018-02-13 DIAGNOSIS — B351 Tinea unguium: Secondary | ICD-10-CM

## 2018-02-13 DIAGNOSIS — I872 Venous insufficiency (chronic) (peripheral): Secondary | ICD-10-CM

## 2018-02-13 DIAGNOSIS — E66811 Obesity, class 1: Secondary | ICD-10-CM

## 2018-02-13 MED ORDER — TERBINAFINE HCL 250 MG PO TABS: 250.0000 mg | ORAL_TABLET | Freq: Every day | ORAL | 0 refills | Status: AC

## 2018-02-13 MED ORDER — EZETIMIBE 10 MG PO TABS: 10.0000 mg | ORAL_TABLET | Freq: Every day | ORAL | 1 refills | Status: DC

## 2018-02-13 MED ORDER — CITALOPRAM HYDROBROMIDE 40 MG PO TABS: 40.0000 mg | ORAL_TABLET | Freq: Every day | ORAL | 1 refills | Status: DC

## 2018-02-13 MED ORDER — LOSARTAN POTASSIUM-HCTZ 50-12.5 MG PO TABS: 1.0000 | ORAL_TABLET | Freq: Every day | ORAL | 1 refills | Status: DC

## 2018-02-13 NOTE — Patient Instructions (Addendum)
Goals    . Blood Pressure < 130/80    . HEMOGLOBIN A1C < 7.0    . Weight (lb) < 215 lb (97.5 kg)         Please schedule diabetic eye exam with ophthalmologist  Please schedule appointment with Dr. Edwyna Ready   COLOGUARD INFORMATION   Cologuard is an easy to use noninvasive colon cancer screening test based on the latest advances in stool DNA science.   Colon cancer is 3rd most diagnosed cancer and 2nd leading cause of death in both men and women 33 years of age and older despite being one of the most preventable and treatable cancers if found early.  4 of out 5 people diagnosed with colon cancer have NO prior family history.  When caught EARLY 90% of colon cancer is curable.   More than 92% of cologuard patients have NO out of pocket cost for screening however only your insurer can confirm how Cologuard would be covered for you. Cologuard has a team of specialist that can help you contact your insurer and ask the right questions. Please call [phone] so they can help.   You will receive a short call from [place] support center at [employer], when you receive a call they will say they are from [place],  to confirm your mailing address and give you more information.  When they calll you, it will appear on the caller ID as "[employer]" or in some cases only this number will appear, [phone].   [facility] [employer] will ship your collection kit directly to you. You will collect a single stool sample in the privacy of your own home, no special preparation required. You will return the kit via [facility] pre-paid shipping or pick-up, in the same box it arrived in. Then I will contact you to discuss your results after I receive them from the laboratory.   If you have any questions or concerns, [facility] Customer Support Specialist are available 24 hours a day, 7 days a week at [phone] or go to [email or web address].     Mediterranean Diet A  Mediterranean diet refers to food and lifestyle choices that are based on the traditions of countries located on the [employer]. This way of eating has been shown to help prevent certain conditions and improve outcomes for people who have chronic diseases, like kidney disease and heart disease. What are tips for following this plan? Lifestyle  Cook and eat meals together with your family, when possible.  Drink enough fluid to keep your urine clear or pale yellow.  Be physically active every day. This includes: ? Aerobic exercise like running or swimming. ? Leisure activities like gardening, walking, or housework.  Get 7-8 hours of sleep each night.  If recommended by your health care provider, drink red wine in moderation. This means 1 glass a day for nonpregnant women and 2 glasses a day for men. A glass of wine equals 5 oz (150 mL). Reading food labels   Check the serving size of packaged foods. For foods such as rice and pasta, the serving size refers to the amount of cooked product, not dry.  Check the total fat in packaged foods. Avoid foods that have saturated fat or trans fats.  Check the ingredients list for added sugars, such as corn syrup. Shopping  At the grocery store, buy most of your food from the areas near the walls of the store. This includes: ? Fresh fruits and vegetables (produce). ? Grains,  beans, nuts, and seeds. Some of these may be available in unpackaged forms or large amounts (in bulk). ? Fresh seafood. ? Poultry and eggs. ? Low-fat dairy products.  Buy whole ingredients instead of prepackaged foods.  Buy fresh fruits and vegetables in-season from local farmers markets.  Buy frozen fruits and vegetables in resealable bags.  If you do not have access to quality fresh seafood, buy precooked frozen shrimp or canned fish, such as tuna, salmon, or sardines.  Buy small amounts of raw or cooked vegetables, salads, or olives from the deli or salad bar  at your store.  Stock your pantry so you always have certain foods on hand, such as olive oil, canned tuna, canned tomatoes, rice, pasta, and beans. Cooking  Cook foods with extra-virgin olive oil instead of using butter or other vegetable oils.  Have meat as a side dish, and have vegetables or grains as your main dish. This means having meat in small portions or adding small amounts of meat to foods like pasta or stew.  Use beans or vegetables instead of meat in common dishes like chili or lasagna.  Experiment with different cooking methods. Try roasting or broiling vegetables instead of steaming or sauteing them.  Add frozen vegetables to soups, stews, pasta, or rice.  Add nuts or seeds for added healthy fat at each meal. You can add these to yogurt, salads, or vegetable dishes.  Marinate fish or vegetables using olive oil, lemon juice, garlic, and fresh herbs. Meal planning   Plan to eat 1 vegetarian meal one day each week. Try to work up to 2 vegetarian meals, if possible.  Eat seafood 2 or more times a week.  Have healthy snacks readily available, such as: ? Vegetable sticks with hummus. ? [place] yogurt. ? Fruit and nut trail mix.  Eat balanced meals throughout the week. This includes: ? Fruit: 2-3 servings a day ? Vegetables: 4-5 servings a day ? Low-fat dairy: 2 servings a day ? Fish, poultry, or lean meat: 1 serving a day ? Beans and legumes: 2 or more servings a week ? Nuts and seeds: 1-2 servings a day ? Whole grains: 6-8 servings a day ? Extra-virgin olive oil: 3-4 servings a day  Limit red meat and sweets to only a few servings a month What are my food choices?  Mediterranean diet ? Recommended ? Grains: Whole-grain pasta. Brown rice. Bulgar wheat. Polenta. Couscous. Whole-wheat bread. [place]. ? Vegetables: Artichokes. Beets. Broccoli. Cabbage. Carrots. Eggplant. Green beans. Chard. Kale. Spinach. Onions. Leeks. Peas. Squash. Tomatoes. Peppers.  Radishes. ? Fruits: Apples. Apricots. Avocado. Berries. Bananas. Cherries. Dates. Figs. Grapes. Lemons. Melon. Oranges. Peaches. Plums. Pomegranate. ? Meats and other protein foods: Beans. Almonds. Sunflower seeds. Pine nuts. Peanuts. [place]. Salmon. Scallops. Shrimp. [place]. Tilapia. Clams. Oysters. Eggs. ? Dairy: Low-fat milk. Cheese. Greek yogurt. ? Beverages: Water. Red wine. Herbal tea. ? Fats and oils: Extra virgin olive oil. Avocado oil. Grape seed oil. ? Sweets and desserts: [place] yogurt with honey. Baked apples. Poached pears. Trail mix. ? Seasoning and other foods: Basil. Cilantro. Coriander. Cumin. Mint. Parsley. Sage. Rosemary. Tarragon. Garlic. Oregano. Thyme. Pepper. Balsalmic vinegar. Tahini. Hummus. Tomato sauce. Olives. Mushrooms. ? Limit these ? Grains: Prepackaged pasta or rice dishes. Prepackaged cereal with added sugar. ? Vegetables: Deep fried potatoes (french fries). ? Fruits: Fruit canned in syrup. ? Meats and other protein foods: Beef. Pork. Lamb. Poultry with skin. Hot dogs. Berniece Salines. ? Dairy: Ice cream. Sour cream. Whole milk. ? Beverages: Juice.  Sugar-sweetened soft drinks. Beer. Liquor and spirits. ? Fats and oils: Butter. Canola oil. Vegetable oil. Beef fat (tallow). Lard. ? Sweets and desserts: Cookies. Cakes. Pies. Candy. ? Seasoning and other foods: Mayonnaise. Premade sauces and marinades. ? The items listed may not be a complete list. Talk with your dietitian about what dietary choices are right for you. Summary  The Mediterranean diet includes both food and lifestyle choices.  Eat a variety of fresh fruits and vegetables, beans, nuts, seeds, and whole grains.  Limit the amount of red meat and sweets that you eat.  Talk with your health care provider about whether it is safe for you to drink red wine in moderation. This means 1 glass a day for nonpregnant women and 2 glasses a day for men. A glass of wine equals 5 oz (150 mL). This information is not intended to  replace advice given to you by your health care provider. Make sure you discuss any questions you have with your health care provider. Document Released: 08/25/2015 Document Revised: 09/27/2015 Document Reviewed: 08/25/2015 Elsevier Interactive Patient Education  2019 Elsevier Inc.  Terbinafine tablets What is this medicine? TERBINAFINE (TER bin a feen) is an antifungal medicine. It is used to treat certain kinds of fungal or yeast infections. This medicine may be used for other purposes; ask your health care provider or pharmacist if you have questions. COMMON BRAND NAME(S): Lamisil, Terbinex What should I tell my health care provider before I take this medicine? They need to know if you have any of these conditions: -drink alcoholic beverages -kidney disease -liver disease -an unusual or allergic reaction to terbinafine, other medicines, foods, dyes, or preservatives -pregnant or trying to get pregnant -breast-feeding How should I use this medicine? Take this medicine by mouth with a full glass of water. Follow the directions on the prescription label. You can take this medicine with food or on an empty stomach. Take your medicine at regular intervals. Do not take your medicine more often than directed. Do not skip doses or stop your medicine early even if you feel better. Do not stop taking except on your doctor's advice. Talk to your pediatrician regarding the use of this medicine in children. Special care may be needed. Overdosage: If you think you have taken too much of this medicine contact a poison control center or emergency room at once. NOTE: This medicine is only for you. Do not share this medicine with others. What if I miss a dose? If you miss a dose, take it as soon as you can. If it is almost time for your next dose, take only that dose. Do not take double or extra doses. What may interact with this medicine? Do not take this medicine with any of the following  medications: -thioridazine This medicine may also interact with the following medications: -beta-blockers -caffeine -cimetidine -cyclosporine -medicines for depression, anxiety, or psychotic disturbances -medicines for fungal infections like fluconazole and ketoconazole -medicines for irregular heartbeat like amiodarone, flecainide and propafenone -rifampin -warfarin This list may not describe all possible interactions. Give your health care provider a list of all the medicines, herbs, non-prescription drugs, or dietary supplements you use. Also tell them if you smoke, drink alcohol, or use illegal drugs. Some items may interact with your medicine. What should I watch for while using this medicine? Visit your doctor or health care provider regularly. Tell your doctor right away if you have nausea or vomiting, loss of appetite, stomach pain on your right upper side,  yellow skin, dark urine, light stools, or are over tired. Some fungal infections need many weeks or months of treatment to cure. If you are taking this medicine for a long time, you will need to have important blood work done. What side effects may I notice from receiving this medicine? Side effects that you should report to your doctor or health care professional as soon as possible: -allergic reactions like skin rash or hives, swelling of the face, lips, or tongue -changes in vision -dark urine -fever or infection -general ill feeling or flu-like symptoms -light-colored stools -loss of appetite, nausea -redness, blistering, peeling or loosening of the skin, including inside the mouth -right upper belly pain -unusually weak or tired -yellowing of the eyes or skin Side effects that usually do not require medical attention (report to your doctor or health care professional if they continue or are bothersome): -changes in taste -diarrhea -hair loss -muscle or joint pain -stomach gas -stomach upset This list may not  describe all possible side effects. Call your doctor for medical advice about side effects. You may report side effects to FDA at 1-800-FDA-1088. Where should I keep my medicine? Keep out of the reach of children. Store at room temperature below 25 degrees C (77 degrees F). Protect from light. Throw away any unused medicine after the expiration date. NOTE: This sheet is a summary. It may not cover all possible information. If you have questions about this medicine, talk to your doctor, pharmacist, or health care provider.  2019 Elsevier/Gold Standard (2007-03-14 16:28:07)

## 2018-02-14 ENCOUNTER — Other Ambulatory Visit: Payer: Self-pay | Admitting: Adult Health

## 2018-02-14 LAB — CBC WITH DIFFERENTIAL/PLATELET
Absolute Monocytes: 722 cells/uL (ref 200–950)
Basophils Absolute: 89 cells/uL (ref 0–200)
Basophils Relative: 0.8 %
Eosinophils Absolute: 266 cells/uL (ref 15–500)
Eosinophils Relative: 2.4 %
HCT: 38.8 % (ref 35.0–45.0)
Hemoglobin: 12.9 g/dL (ref 11.7–15.5)
Lymphs Abs: 3241 cells/uL (ref 850–3900)
MCH: 28.5 pg (ref 27.0–33.0)
MCHC: 33.2 g/dL (ref 32.0–36.0)
MCV: 85.8 fL (ref 80.0–100.0)
MPV: 10.6 fL (ref 7.5–12.5)
Monocytes Relative: 6.5 %
Neutro Abs: 6782 cells/uL (ref 1500–7800)
Neutrophils Relative %: 61.1 %
PLATELETS: 413 10*3/uL — AB (ref 140–400)
RBC: 4.52 10*6/uL (ref 3.80–5.10)
RDW: 11.8 % (ref 11.0–15.0)
Total Lymphocyte: 29.2 %
WBC: 11.1 10*3/uL — ABNORMAL HIGH (ref 3.8–10.8)

## 2018-02-14 LAB — COMPLETE METABOLIC PANEL WITH GFR
AG Ratio: 1.3 (calc) (ref 1.0–2.5)
ALT: 13 U/L (ref 6–29)
AST: 12 U/L (ref 10–35)
Albumin: 3.9 g/dL (ref 3.6–5.1)
Alkaline phosphatase (APISO): 73 U/L (ref 33–130)
BUN: 20 mg/dL (ref 7–25)
CO2: 27 mmol/L (ref 20–32)
Calcium: 10 mg/dL (ref 8.6–10.4)
Chloride: 97 mmol/L — ABNORMAL LOW (ref 98–110)
Creat: 0.7 mg/dL (ref 0.50–1.05)
GFR, Est African American: 117 mL/min/{1.73_m2} (ref >=60)
GFR, Est Non African American: 101 mL/min/{1.73_m2} (ref >=60)
GLOBULIN: 3 g/dL (ref 1.9–3.7)
Glucose, Bld: 305 mg/dL — ABNORMAL HIGH (ref 65–99)
Potassium: 4.7 mmol/L (ref 3.5–5.3)
SODIUM: 133 mmol/L — AB (ref 135–146)
Total Bilirubin: 0.4 mg/dL (ref 0.2–1.2)
Total Protein: 6.9 g/dL (ref 6.1–8.1)

## 2018-02-14 LAB — URINALYSIS, ROUTINE W REFLEX MICROSCOPIC
Bilirubin Urine: NEGATIVE
Hgb urine dipstick: NEGATIVE
Leukocytes, UA: NEGATIVE
Nitrite: NEGATIVE
Protein, ur: NEGATIVE
Specific Gravity, Urine: 1.038 — ABNORMAL HIGH (ref 1.001–1.03)
pH: <=5 (ref 5.0–8.0)

## 2018-02-14 LAB — LIPID PANEL
Cholesterol: 251 mg/dL — ABNORMAL HIGH (ref <200)
HDL: 55 mg/dL (ref >50)
LDL Cholesterol (Calc): 166 mg/dL (calc) — ABNORMAL HIGH
Non-HDL Cholesterol (Calc): 196 mg/dL (calc) — ABNORMAL HIGH (ref <130)
Total CHOL/HDL Ratio: 4.6 (calc) (ref <5.0)
Triglycerides: 153 mg/dL — ABNORMAL HIGH (ref <150)

## 2018-02-14 LAB — MICROALBUMIN / CREATININE URINE RATIO
Creatinine, Urine: 239 mg/dL (ref 20–275)
Microalb Creat Ratio: 13 mcg/mg creat (ref <30)
Microalb, Ur: 3 mg/dL

## 2018-02-14 LAB — HEMOGLOBIN A1C
Hgb A1c MFr Bld: 14 % of total Hgb — ABNORMAL HIGH (ref <5.7)
Mean Plasma Glucose: 355 (calc)
eAG (mmol/L): 19.7 (calc)

## 2018-02-14 LAB — VITAMIN D 25 HYDROXY (VIT D DEFICIENCY, FRACTURES): Vit D, 25-Hydroxy: 18 ng/mL — ABNORMAL LOW (ref 30–100)

## 2018-02-14 LAB — MAGNESIUM: Magnesium: 1.8 mg/dL (ref 1.5–2.5)

## 2018-02-14 LAB — TSH: TSH: 1.87 mIU/L

## 2018-02-14 MED ORDER — GLIMEPIRIDE 4 MG PO TABS: ORAL_TABLET | ORAL | 1 refills | Status: DC

## 2018-02-18 ENCOUNTER — Telehealth: Payer: Self-pay

## 2018-02-18 NOTE — Telephone Encounter (Signed)
Patient states that you mentioned to her about going back on her fluid pill, do you want her to do that? Please send in rx for that.

## 2018-02-24 ENCOUNTER — Other Ambulatory Visit: Payer: Self-pay | Admitting: Adult Health

## 2018-02-24 ENCOUNTER — Telehealth: Payer: Self-pay

## 2018-02-24 MED ORDER — LOSARTAN POTASSIUM 50 MG PO TABS: 50.0000 mg | ORAL_TABLET | Freq: Every day | ORAL | 1 refills | Status: DC

## 2018-02-24 MED ORDER — HYDROCHLOROTHIAZIDE 12.5 MG PO TABS: 12.5000 mg | ORAL_TABLET | Freq: Every day | ORAL | 1 refills | Status: DC

## 2018-02-24 NOTE — Telephone Encounter (Signed)
Losartan/HCTZ in unavailable due to manufacturer backorder. Please send in new script

## 2018-02-24 NOTE — Addendum Note (Signed)
Addended by: Dionicio Stall on: 02/24/2018 01:47 PM   Modules accepted: Orders

## 2018-03-05 NOTE — Progress Notes (Signed)
LMTCB

## 2018-03-17 ENCOUNTER — Ambulatory Visit: Payer: Self-pay | Admitting: Adult Health

## 2018-03-24 ENCOUNTER — Ambulatory Visit: Payer: Self-pay | Admitting: Adult Health

## 2018-03-28 ENCOUNTER — Encounter: Payer: Self-pay | Admitting: Adult Health

## 2018-03-28 DIAGNOSIS — Z789 Other specified health status: Secondary | ICD-10-CM

## 2018-03-28 HISTORY — DX: Other specified health status: Z78.9

## 2018-03-28 NOTE — Progress Notes (Signed)
Diabetes Education and Follow-Up Visit  53 y.o.female presents for diabetic education. She has Diabetes Mellitus type 2:  without complications, she is on bASA, and denies hyperglycemia, increased appetite, nausea, paresthesia of the feet, polydipsia, polyuria, visual disturbances, vomiting and weight loss.  Last hemoglobin A1c was: Lab Results  Component Value Date   HGBA1C 14.0 (H) 02/13/2018   HGBA1C 12.1 (H) 09/02/2017   HGBA1C 8.3 12/28/2016    BMI is Body mass index is 35.95 kg/m., she has been working on diet and exercise. Wt Readings from Last 3 Encounters:  03/31/18 233 lb (105.7 kg)  02/13/18 238 lb 6.4 oz (108.1 kg)  10/09/17 238 lb (108 kg)   Pt is on a regimen of: Amaryl 4 mg BID , Glucophage XR 1000 mg BID  Pt checks her sugars 1 x day  Lowest sugar was 79.  She has hypoglycemia awareness,  Highest sugar was 347.  Most fasting glucose have ranged 140-160. Glucometer: Trumetrix  She has been working on diet;   She has been cutting down on sweets (sugary sweet cereal, rice crispy treats) Eating more consistently  Cutting back on sausage, avoid biscuits Eating more salad  Fluid: coffee with splenda, water with flavor enhancer, does admit to soda 5 days a week while at work and is working on cutting back    Exercise: Limited due to currently working 2 jobs, was successful last   Patient does not have CKD She is on ACE/ARB  Her blood pressure has been controlled at home, today their BP is BP: 124/72  She does not workout. She denies chest pain, shortness of breath, dizziness.   Lab Results  Component Value Date   GFRNONAA 101 02/13/2018    Lab Results  Component Value Date   CREATININE 0.70 02/13/2018   BUN 20 02/13/2018   NA 133 (L) 02/13/2018   K 4.7 02/13/2018   CL 97 (L) 02/13/2018   CO2 27 02/13/2018    Lab Results  Component Value Date   MICROALBUR 3.0 02/13/2018     She is not on a Statin due to history of statin intolerance She is  not at goal of less than 70.  Lab Results  Component Value Date   CHOL 251 (H) 02/13/2018   HDL 55 02/13/2018   LDLCALC 166 (H) 02/13/2018   TRIG 153 (H) 02/13/2018   CHOLHDL 4.6 02/13/2018     Problem List has Type 2 diabetes mellitus with hyperglycemia, without long-term current use of insulin (HCC); Hypertension; Hyperlipidemia associated with type 2 diabetes mellitus (HCC); Obesity (BMI 30.0-34.9); Major depression in partial remission (HCC); Former smoker; Onychomycosis; Chronic venous insufficiency; and Statin intolerance on their problem list.  Medications Current Outpatient Medications on File Prior to Visit  Medication Sig  . citalopram (CELEXA) 40 MG tablet Take 1 tablet (40 mg total) by mouth daily.  Marland Kitchen ezetimibe (ZETIA) 10 MG tablet Take 1 tablet (10 mg total) by mouth daily.  Marland Kitchen glimepiride (AMARYL) 4 MG tablet Take 1 tab twice daily with meals for diabetes.  Marland Kitchen glucose blood test strip Use as instructed  . metFORMIN (GLUCOPHAGE-XR) 500 MG 24 hr tablet Take 2 tablets (1,000 mg total) by mouth 2 (two) times daily with a meal.  . terbinafine (LAMISIL) 250 MG tablet Take 1 tablet (250 mg total) by mouth daily. Take one daily for 3 months, need liver function lab at 6 weeks.  Marland Kitchen tiZANidine (ZANAFLEX) 4 MG capsule Take 1 capsule (4 mg total) by mouth 3 (three)  times daily as needed for muscle spasms.  Marland Kitchen aspirin EC 81 MG tablet Take 81 mg by mouth daily.  . hydrochlorothiazide (HYDRODIURIL) 12.5 MG tablet Take 1 tablet (12.5 mg total) by mouth daily. (Patient not taking: Reported on 03/31/2018)  . losartan (COZAAR) 50 MG tablet Take 1 tablet (50 mg total) by mouth daily. (Patient not taking: Reported on 03/31/2018)   No current facility-administered medications on file prior to visit.     ROS- see HPI  Physical Exam: Blood pressure 124/72, pulse 81, temperature (!) 97.2 F (36.2 C), height 5' 7.5" (1.715 m), weight 233 lb (105.7 kg), SpO2 98 %. Body mass index is 35.95  kg/m. General Appearance: Well nourished, in no apparent distress. Eyes: PERRLA, EOMs, conjunctiva no swelling or erythema ENT/Mouth: Ext aud canals clear, TMs without erythema, bulging. No erythema, swelling, or exudate on post pharynx.  Tonsils not swollen or erythematous. Hearing normal.  Respiratory: Respiratory effort normal, BS equal bilaterally without rales, rhonchi, wheezing or stridor.  Cardio: RRR with no MRGs. Brisk peripheral pulses without edema.  Abdomen: Soft, + BS.  Non tender, no guarding, rebound, hernias, masses. Musculoskeletal: Full ROM, 5/5 strength, normal gait.  Skin: Warm, dry without rashes, lesions, ecchymosis.  Neuro: Cranial nerves intact. Normal muscle tone, no cerebellar symptoms. Sensation intact.    Plan and Assessment: Diabetes Education: Reviewed 'ABCs' of diabetes management (respective goals in parentheses):  A1C (<9 for next visit, then ultimately <7), blood pressure (<130/80), and cholesterol (LDL <70) Significantly improved glucose values with more consistent meals, and consequent  Eye Exam yearly and Dental Exam every 6 months - she hasn't scheduled eye exam but will do so Dietary recommendations reviewed with specific low carb, low sugar alternatives discussed Physical Activity recommendations reviewed - 30 min three days a week - Continue daily glucose checks; fasting glucose goal consistently <150, then <130 - given sugar log and advised how to fill it and to bring it at next appt  - given foot care handout and explained the principles  - given instructions for hypoglycemia management    Future Appointments  Date Time Provider Department Center  05/26/2018  3:45 PM Judd Gaudier, NP [facility] None  09/01/2018  3:45 PM Judd Gaudier, NP [facility] None  03/02/2019  3:00 PM Judd Gaudier, NP [facility] None

## 2018-03-31 ENCOUNTER — Other Ambulatory Visit: Payer: Self-pay

## 2018-03-31 ENCOUNTER — Ambulatory Visit: Payer: BC Managed Care – PPO | Admitting: Adult Health

## 2018-03-31 ENCOUNTER — Encounter: Payer: Self-pay | Admitting: Adult Health

## 2018-03-31 VITALS — BP 124/72 | HR 81 | Temp 97.2°F | Ht 67.5 in | Wt 233.0 lb

## 2018-03-31 DIAGNOSIS — I10 Essential (primary) hypertension: Secondary | ICD-10-CM

## 2018-03-31 DIAGNOSIS — Z789 Other specified health status: Secondary | ICD-10-CM

## 2018-03-31 DIAGNOSIS — E669 Obesity, unspecified: Secondary | ICD-10-CM | POA: Diagnosis not present

## 2018-03-31 DIAGNOSIS — E1165 Type 2 diabetes mellitus with hyperglycemia: Secondary | ICD-10-CM

## 2018-03-31 DIAGNOSIS — E1169 Type 2 diabetes mellitus with other specified complication: Secondary | ICD-10-CM | POA: Diagnosis not present

## 2018-03-31 DIAGNOSIS — E785 Hyperlipidemia, unspecified: Secondary | ICD-10-CM | POA: Diagnosis not present

## 2018-03-31 MED ORDER — BUPROPION HCL ER (XL) 150 MG PO TB24: ORAL_TABLET | ORAL | 0 refills | Status: DC

## 2018-03-31 MED ORDER — LOSARTAN POTASSIUM 50 MG PO TABS: ORAL_TABLET | ORAL | 1 refills | Status: DC

## 2018-03-31 NOTE — Patient Instructions (Addendum)
Goals    . Blood Pressure < 130/80    . DIET - DECREASE SODA OR JUICE INTAKE    . Exercise 3x per week (30 min per time)    . HEMOGLOBIN A1C < 7.0    . Weight (lb) < 215 lb (97.5 kg)        Goal is to get fasting sugar <150 consistently, then <130   Try Dave's killer bread (thin sliced - lime green label) - with peanut or almond butter, or with chicken/tuna salad   Ole xtreme wellness high fiber wraps   Each about 50 calories, very low carb, high fiber   Try stevia sweetened sodas for splurging (zevia brand or pepsi/cola TRUE)   Continue with current diabetes meds - BUT if consistently having low blood sugars in the morning - try cutting night time glimepiride in 1/2 for 2 mg only      Carbohydrate Counting for Diabetes Mellitus, Adult  Carbohydrate counting is a method of keeping track of how many carbohydrates you eat. Eating carbohydrates naturally increases the amount of sugar (glucose) in the blood. Counting how many carbohydrates you eat helps keep your blood glucose within normal limits, which helps you manage your diabetes (diabetes mellitus). It is important to know how many carbohydrates you can safely have in each meal. This is different for every person. A diet and nutrition specialist (registered dietitian) can help you make a meal plan and calculate how many carbohydrates you should have at each meal and snack. Carbohydrates are found in the following foods:  Grains, such as breads and cereals.  Dried beans and soy products.  Starchy vegetables, such as potatoes, peas, and corn.  Fruit and fruit juices.  Milk and yogurt.  Sweets and snack foods, such as cake, cookies, candy, chips, and soft drinks. How do I count carbohydrates? There are two ways to count carbohydrates in food. You can use either of the methods or a combination of both. Reading "Nutrition Facts" on packaged food The "Nutrition Facts" list is included on the labels of almost all packaged  foods and beverages in the [place] It includes:  The serving size.  Information about nutrients in each serving, including the grams (g) of carbohydrate per serving. To use the "Nutrition Facts":  Decide how many servings you will have.  Multiply the number of servings by the number of carbohydrates per serving.  The resulting number is the total amount of carbohydrates that you will be having. Learning standard serving sizes of other foods When you eat carbohydrate foods that are not packaged or do not include "Nutrition Facts" on the label, you need to measure the servings in order to count the amount of carbohydrates:  Measure the foods that you will eat with a food scale or measuring cup, if needed.  Decide how many standard-size servings you will eat.  Multiply the number of servings by 15. Most carbohydrate-rich foods have about 15 g of carbohydrates per serving. ? For example, if you eat 8 oz (170 g) of strawberries, you will have eaten 2 servings and 30 g of carbohydrates (2 servings x 15 g = 30 g).  For foods that have more than one food mixed, such as soups and casseroles, you must count the carbohydrates in each food that is included. The following list contains standard serving sizes of common carbohydrate-rich foods. Each of these servings has about 15 g of carbohydrates:   hamburger bun or  English muffin.   oz (15  mL) syrup.   oz (14 g) jelly.  1 slice of bread.  1 six-inch tortilla.  3 oz (85 g) cooked rice or pasta.  4 oz (113 g) cooked dried beans.  4 oz (113 g) starchy vegetable, such as peas, corn, or potatoes.  4 oz (113 g) hot cereal.  4 oz (113 g) mashed potatoes or  of a large baked potato.  4 oz (113 g) canned or frozen fruit.  4 oz (120 mL) fruit juice.  4-6 crackers.  6 chicken nuggets.  6 oz (170 g) unsweetened dry cereal.  6 oz (170 g) plain fat-free yogurt or yogurt sweetened with artificial sweeteners.  8 oz (240 mL) milk.  8  oz (170 g) fresh fruit or one small piece of fruit.  24 oz (680 g) popped popcorn. Example of carbohydrate counting Sample meal  3 oz (85 g) chicken breast.  6 oz (170 g) brown rice.  4 oz (113 g) corn.  8 oz (240 mL) milk.  8 oz (170 g) strawberries with sugar-free whipped topping. Carbohydrate calculation 1. Identify the foods that contain carbohydrates: ? Rice. ? Corn. ? Milk. ? Strawberries. 2. Calculate how many servings you have of each food: ? 2 servings rice. ? 1 serving corn. ? 1 serving milk. ? 1 serving strawberries. 3. Multiply each number of servings by 15 g: ? 2 servings rice x 15 g = 30 g. ? 1 serving corn x 15 g = 15 g. ? 1 serving milk x 15 g = 15 g. ? 1 serving strawberries x 15 g = 15 g. 4. Add together all of the amounts to find the total grams of carbohydrates eaten: ? 30 g + 15 g + 15 g + 15 g = 75 g of carbohydrates total. Summary  Carbohydrate counting is a method of keeping track of how many carbohydrates you eat.  Eating carbohydrates naturally increases the amount of sugar (glucose) in the blood.  Counting how many carbohydrates you eat helps keep your blood glucose within normal limits, which helps you manage your diabetes.  A diet and nutrition specialist (registered dietitian) can help you make a meal plan and calculate how many carbohydrates you should have at each meal and snack. This information is not intended to replace advice given to you by your health care provider. Make sure you discuss any questions you have with your health care provider. Document Released: 01/01/2005 Document Revised: 07/11/2016 Document Reviewed: 06/15/2015 Elsevier Interactive Patient Education  2019 [employer].

## 2018-04-01 LAB — COMPLETE METABOLIC PANEL WITH GFR
AG Ratio: 1.5 (calc) (ref 1.0–2.5)
ALBUMIN MSPROF: 4.1 g/dL (ref 3.6–5.1)
ALT: 18 U/L (ref 6–29)
AST: 15 U/L (ref 10–35)
Alkaline phosphatase (APISO): 71 U/L (ref 37–153)
BUN: 16 mg/dL (ref 7–25)
CO2: 29 mmol/L (ref 20–32)
Calcium: 9.6 mg/dL (ref 8.6–10.4)
Chloride: 99 mmol/L (ref 98–110)
Creat: 0.92 mg/dL (ref 0.50–1.05)
GFR, EST AFRICAN AMERICAN: 84 mL/min/{1.73_m2} (ref >=60)
GFR, Est Non African American: 73 mL/min/{1.73_m2} (ref >=60)
Globulin: 2.8 g/dL (calc) (ref 1.9–3.7)
Glucose, Bld: 397 mg/dL — ABNORMAL HIGH (ref 65–99)
Potassium: 4.8 mmol/L (ref 3.5–5.3)
SODIUM: 136 mmol/L (ref 135–146)
TOTAL PROTEIN: 6.9 g/dL (ref 6.1–8.1)
Total Bilirubin: 0.2 mg/dL (ref 0.2–1.2)

## 2018-04-28 ENCOUNTER — Other Ambulatory Visit: Payer: Self-pay | Admitting: Adult Health

## 2018-04-28 DIAGNOSIS — E1165 Type 2 diabetes mellitus with hyperglycemia: Secondary | ICD-10-CM

## 2018-05-22 DIAGNOSIS — E559 Vitamin D deficiency, unspecified: Secondary | ICD-10-CM | POA: Insufficient documentation

## 2018-05-22 NOTE — Progress Notes (Deleted)
FOLLOW UP  Assessment and Plan:   Hypertension Well controlled with current medications  Monitor blood pressure at home; patient to call if consistently greater than 130/80 Continue DASH diet.   Reminder to go to the ER if any CP, SOB, nausea, dizziness, severe HA, changes vision/speech, left arm numbness and tingling and jaw pain.  Cholesterol Currently at goal;  Continue low cholesterol diet and exercise.  Check lipid panel.   Diabetes with hyperglycemia Continue medication: metformin ***, glimepiride *** ***insulin ***  Continue diet and exercise.  Perform daily foot/skin check, notify office of any concerning changes.  Check A1C  Obesity with co morbidities Long discussion about weight loss, diet, and exercise Recommended diet heavy in fruits and veggies and low in animal meats, cheeses, and dairy products, appropriate calorie intake Discussed ideal weight for height (below ***) and initial weight goal (***) Patient will work on *** Will follow up in 3 months  Vitamin D Def Below goal at last visit; she did *** increase dose continue supplementation to maintain goal of 60-100 *** Vit D level  Chronic venous insufficiency ***  Depression Controlled with wellbutrin; Continue  Lifestyle discussed: diet/exerise, sleep hygiene, stress management, hydration   Continue diet and meds as discussed. Further disposition pending results of labs. Discussed med's effects and SE's.   Over 30 minutes of exam, counseling, chart review, and critical decision making was performed.   Future Appointments  Date Time Provider Department Center  05/26/2018  3:45 PM Judd Gaudier, NP [facility] None  09/01/2018  3:45 PM Judd Gaudier, NP [facility] None  03/02/2019  3:00 PM Judd Gaudier, NP [facility] None    ----------------------------------------------------------------------------------------------------------------------  HPI 53 y.o. female  presents for 3 month follow  up on hypertension, cholesterol, diabetes, obesity, depression and vitamin D deficiency.   she has a diagnosis of depression and is currently on wellbutrin 150 mg daily ***, reports symptoms are*** well controlled on current regimen. she    BMI is There is no height or weight on file to calculate BMI., she {HAS HAS EXN:17001} been working on diet and exercise. Wt Readings from Last 3 Encounters:  03/31/18 233 lb (105.7 kg)  02/13/18 238 lb 6.4 oz (108.1 kg)  10/09/17 238 lb (108 kg)   Her blood pressure {HAS HAS NOT:18834} been controlled at home, today their BP is    She {DOES_DOES VCB:44967} workout. She denies chest pain, shortness of breath, dizziness.   She is on cholesterol medication Zetia 10 mg daily and denies myalgias. She has hx of statin intolerance ***  Her cholesterol is not at goal. The cholesterol last visit was:   Lab Results  Component Value Date   CHOL 251 (H) 02/13/2018   HDL 55 02/13/2018   LDLCALC 166 (H) 02/13/2018   TRIG 153 (H) 02/13/2018   CHOLHDL 4.6 02/13/2018    She {Has/has not:18111} been working on diet and exercise for T2DM (newly on metformin 500 mg x 4, glimepiride 4 mg BID), and denies {Symptoms; diabetes w/o none:19199}. Last A1C in the office was:  Lab Results  Component Value Date   HGBA1C 14.0 (H) 02/13/2018    Lab Results  Component Value Date   GFRNONAA 73 03/31/2018   Patient is *** on Vitamin D supplement.   Lab Results  Component Value Date   VD25OH 18 (L) 02/13/2018        Current Medications:  Current Outpatient Medications on File Prior to Visit  Medication Sig  . aspirin EC 81 MG tablet  Take 81 mg by mouth daily.  Marland Kitchen. buPROPion (WELLBUTRIN XL) 150 MG 24 hr tablet Start taking 1 tab daily in AM for mood; can increase to 2 tabs (300 mg) after 30 days if needed.  . ezetimibe (ZETIA) 10 MG tablet Take 1 tablet (10 mg total) by mouth daily.  Marland Kitchen. glimepiride (AMARYL) 4 MG tablet Take 1 tab twice daily with meals for diabetes.  Marland Kitchen.  glucose blood test strip Use as instructed  . hydrochlorothiazide (HYDRODIURIL) 12.5 MG tablet Take 1 tablet (12.5 mg total) by mouth daily. (Patient not taking: Reported on 03/31/2018)  . losartan (COZAAR) 50 MG tablet Take 1/2 tab (25 mg) daily to protect kidneys.  . metFORMIN (GLUCOPHAGE-XR) 500 MG 24 hr tablet TAKE 2 TABLETS(1000 MG) BY MOUTH TWICE DAILY WITH A MEAL  . tiZANidine (ZANAFLEX) 4 MG capsule Take 1 capsule (4 mg total) by mouth 3 (three) times daily as needed for muscle spasms.   No current facility-administered medications on file prior to visit.      Allergies:  Allergies  Allergen Reactions  . Statins Other (See Comments)    myalgias     Medical History:  Past Medical History:  Diagnosis Date  . Diabetes mellitus without complication (HCC)   . Leg pain, left   . Statin intolerance 03/28/2018   Family history- Reviewed and unchanged Social history- Reviewed and unchanged   Review of Systems:  Review of Systems  Constitutional: Negative for malaise/fatigue and weight loss.  HENT: Negative for hearing loss and tinnitus.   Eyes: Negative for blurred vision and double vision.  Respiratory: Negative for cough, shortness of breath and wheezing.   Cardiovascular: Negative for chest pain, palpitations, orthopnea, claudication and leg swelling.  Gastrointestinal: Negative for abdominal pain, blood in stool, constipation, diarrhea, heartburn, melena, nausea and vomiting.  Genitourinary: Negative.   Musculoskeletal: Negative for joint pain and myalgias.  Skin: Negative for rash.  Neurological: Negative for dizziness, tingling, sensory change, weakness and headaches.  Endo/Heme/Allergies: Negative for polydipsia.  Psychiatric/Behavioral: Negative.   All other systems reviewed and are negative.   Physical Exam: There were no vitals taken for this visit. Wt Readings from Last 3 Encounters:  03/31/18 233 lb (105.7 kg)  02/13/18 238 lb 6.4 oz (108.1 kg)  10/09/17 238  lb (108 kg)   General Appearance: Well nourished, in no apparent distress. Eyes: PERRLA, EOMs, conjunctiva no swelling or erythema Sinuses: No Frontal/maxillary tenderness ENT/Mouth: Ext aud canals clear, TMs without erythema, bulging. No erythema, swelling, or exudate on post pharynx.  Tonsils not swollen or erythematous. Hearing normal.  Neck: Supple, thyroid normal.  Respiratory: Respiratory effort normal, BS equal bilaterally without rales, rhonchi, wheezing or stridor.  Cardio: RRR with no MRGs. Brisk peripheral pulses without edema.  Abdomen: Soft, + BS.  Non tender, no guarding, rebound, hernias, masses. Lymphatics: Non tender without lymphadenopathy.  Musculoskeletal: Full ROM, 5/5 strength, {PSY - GAIT AND STATION:22860} gait Skin: Warm, dry without rashes, lesions, ecchymosis.  Neuro: Cranial nerves intact. No cerebellar symptoms.  Psych: Awake and oriented X 3, normal affect, Insight and Judgment appropriate.    Dan MakerAshley C Chanc Kervin, NP 2:19 PM [facility]

## 2018-05-26 ENCOUNTER — Encounter: Payer: Self-pay | Admitting: Adult Health

## 2018-05-26 ENCOUNTER — Other Ambulatory Visit: Payer: Self-pay

## 2018-05-26 ENCOUNTER — Ambulatory Visit: Payer: BC Managed Care – PPO | Admitting: Adult Health

## 2018-05-26 ENCOUNTER — Ambulatory Visit: Payer: Self-pay | Admitting: Adult Health

## 2018-05-26 VITALS — BP 130/72 | HR 82 | Temp 97.0°F | Ht 67.5 in | Wt 246.0 lb

## 2018-05-26 DIAGNOSIS — Z79899 Other long term (current) drug therapy: Secondary | ICD-10-CM | POA: Diagnosis not present

## 2018-05-26 DIAGNOSIS — E559 Vitamin D deficiency, unspecified: Secondary | ICD-10-CM

## 2018-05-26 DIAGNOSIS — F324 Major depressive disorder, single episode, in partial remission: Secondary | ICD-10-CM

## 2018-05-26 DIAGNOSIS — I10 Essential (primary) hypertension: Secondary | ICD-10-CM | POA: Diagnosis not present

## 2018-05-26 DIAGNOSIS — Z789 Other specified health status: Secondary | ICD-10-CM

## 2018-05-26 DIAGNOSIS — E1169 Type 2 diabetes mellitus with other specified complication: Secondary | ICD-10-CM

## 2018-05-26 DIAGNOSIS — E785 Hyperlipidemia, unspecified: Secondary | ICD-10-CM

## 2018-05-26 DIAGNOSIS — I872 Venous insufficiency (chronic) (peripheral): Secondary | ICD-10-CM

## 2018-05-26 DIAGNOSIS — E1165 Type 2 diabetes mellitus with hyperglycemia: Secondary | ICD-10-CM

## 2018-05-26 DIAGNOSIS — E669 Obesity, unspecified: Secondary | ICD-10-CM

## 2018-05-26 MED ORDER — ROSUVASTATIN CALCIUM 5 MG PO TABS: ORAL_TABLET | ORAL | 1 refills | Status: DC

## 2018-05-26 MED ORDER — GLIMEPIRIDE 4 MG PO TABS: ORAL_TABLET | ORAL | 1 refills | Status: DC

## 2018-05-26 MED ORDER — LOSARTAN POTASSIUM 25 MG PO TABS: ORAL_TABLET | ORAL | 1 refills | Status: DC

## 2018-05-26 MED ORDER — BUSPIRONE HCL 10 MG PO TABS: 10.0000 mg | ORAL_TABLET | Freq: Two times a day (BID) | ORAL | 2 refills | Status: DC

## 2018-05-26 MED ORDER — TOPIRAMATE 25 MG PO TABS: 25.0000 mg | ORAL_TABLET | Freq: Every day | ORAL | 2 refills | Status: DC

## 2018-05-26 MED ORDER — HYDROCHLOROTHIAZIDE 25 MG PO TABS: 25.0000 mg | ORAL_TABLET | Freq: Every day | ORAL | 1 refills | Status: DC

## 2018-05-26 MED ORDER — BUPROPION HCL ER (XL) 300 MG PO TB24: ORAL_TABLET | ORAL | 1 refills | Status: DC

## 2018-05-26 MED ORDER — TERBINAFINE HCL 250 MG PO TABS: ORAL_TABLET | ORAL | 0 refills | Status: DC

## 2018-05-26 MED ORDER — PHENTERMINE HCL 37.5 MG PO TABS: ORAL_TABLET | ORAL | 5 refills | Status: DC

## 2018-05-26 NOTE — Progress Notes (Signed)
FOLLOW UP  Assessment and Plan:   Hypertension Well controlled with current medications  Monitor blood pressure at home; patient to call if consistently greater than 130/80 Continue DASH diet.   Reminder to go to the ER if any CP, SOB, nausea, dizziness, severe HA, changes vision/speech, left arm numbness and tingling and jaw pain.  Cholesterol Currently above goal; history of statin intolerance is somewhat questionable; she is agreeable to trial of low dose infrequent rosuvastatin after extensive discussion Follow up in 1 month Continue low cholesterol diet and exercise.  Check lipid panel.   Diabetes with diabetic chronic kidney disease Continue medication: metformin 2000 mg daily, glimepiride 4 mg BID If A1C similar will initiate novolin 70/30 BID; if significantly improved add GLP1 inhibitor which she has tolerated well in the past, then consider SGLT1 once A1C improving Continue diet and exercise.  Perform daily foot/skin check, notify office of any concerning changes.  Check A1C  Obesity with co morbidities Long discussion about weight loss, diet, and exercise Recommended diet heavy in fruits and veggies and low in animal meats, cheeses, and dairy products, appropriate calorie intake Discussed ideal weight for height  and initial weight goal (<235lb) Will start the patient on phentermine and topamax- hand out given and AE's discussed, will do close follow up. Return in 4 weeks.   Vitamin D Def Well below goal at last visit; continue supplementation to maintain goal of 60-100 Check Vit D level  Venous insufficiency START compression, daily walking, elevate extremities Increase HCTZ to 25 mg though discussed limitations of this - lifestyle/weight loss essential  Depression/Anxiety Start new medication as prescribed- buspar 10 mg BID  Stress management techniques discussed, increase water, good sleep hygiene discussed, increase exercise, and increase veggies.  Follow up 1  month, call the office if any new AE's from medications and we will switch them   Continue diet and meds as discussed. Further disposition pending results of labs. Discussed med's effects and SE's.   Over 30 minutes of exam, counseling, chart review, and critical decision making was performed.   Future Appointments  Date Time Provider Department Center  09/01/2018  3:45 PM Judd Gaudierorbett, Davian Hanshaw, NP [facility] None  03/02/2019  3:00 PM Judd Gaudierorbett, Corneshia Hines, NP [facility] None    ----------------------------------------------------------------------------------------------------------------------  HPI 53 y.o. female  presents for 3 month follow up on hypertension, cholesterol, diabetes, weight and vitamin D deficiency.   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   She is former smoker with 30+ pack year history, quit smoking 04/2017, had CXR 09/2017 that was clear.   BMI is Body mass index is 37.96 kg/m., she has not been working on diet and exercise, has been stressed and work hours have been limiting. NOT walking yet.  Wt Readings from Last 3 Encounters:  05/26/18 246 lb (111.6 kg)  03/31/18 233 lb (105.7 kg)  02/13/18 238 lb 6.4 oz (108.1 kg)   Her blood pressure has been controlled at home, today their BP is BP: 130/72  She does not workout. She denies chest pain, shortness of breath, dizziness.   She is on cholesterol medication Zetia and denies myalgias. She has documented hx of statin intolerance. Her cholesterol is not at goal. The cholesterol last visit was:   Lab Results  Component Value Date   CHOL 251 (H) 02/13/2018   HDL 55 02/13/2018   LDLCALC 166 (H) 02/13/2018   TRIG 153 (H) 02/13/2018   CHOLHDL 4.6 02/13/2018    She has not  been working on diet and exercise for poorly controlled T2DM, she has strongly preferred to work on lifestyle and avoid initiation of insulin, currently taking metformin 500 mg 4 tabs daily, glimepiride 4 mg BID and denies foot ulcerations, hyperglycemia, hypoglycemia , increased appetite, nausea, paresthesia of the feet, polydipsia and polyuria. She admits not recently checking, but was checking regularly initially and was averaging 150 fasting (range of 80-180 depending on previous evening meal). Last A1C in the office was:  Lab Results  Component Value Date   HGBA1C 14.0 (H) 02/13/2018   Patient is newly on Vitamin D supplement.   Lab Results  Component Value Date   VD25OH 18 (L) 02/13/2018        Current Medications:  Current Outpatient Medications on File Prior to Visit  Medication Sig  . buPROPion (WELLBUTRIN XL) 150 MG 24 hr tablet Start taking 1 tab daily in AM for mood; can increase to 2 tabs (300 mg) after 30 days if needed. (Patient taking differently: Take 300 mg by mouth daily. )  . ezetimibe (ZETIA) 10 MG tablet Take 1 tablet (10 mg total) by mouth daily.  Marland Kitchen glimepiride (AMARYL) 4 MG tablet Take 1 tab twice daily with meals for diabetes.  Marland Kitchen glucose blood test strip Use as instructed  . hydrochlorothiazide (HYDRODIURIL) 12.5 MG tablet Take 1 tablet (12.5 mg total) by mouth daily.  Marland Kitchen losartan (COZAAR) 50 MG tablet Take 1/2 tab (25 mg) daily to protect kidneys.  . metFORMIN (GLUCOPHAGE-XR) 500 MG 24 hr tablet TAKE 2 TABLETS(1000 MG) BY MOUTH TWICE DAILY WITH A MEAL  . tiZANidine (ZANAFLEX) 4 MG capsule Take 1 capsule (4 mg total) by mouth 3 (three) times daily as needed for muscle spasms.  Marland Kitchen aspirin EC 81 MG tablet Take 81 mg by mouth daily.   No current facility-administered medications on file prior to visit.      Allergies:  Allergies  Allergen Reactions  . Statins Other (See Comments)    myalgias     Medical History:  Past Medical History:  Diagnosis Date  .  Diabetes mellitus without complication (HCC)   . Leg pain, left   . Statin intolerance 03/28/2018   Family history- Reviewed and unchanged Social history- Reviewed and unchanged   Review of Systems:  Review of Systems  Constitutional: Negative for malaise/fatigue and weight loss.  HENT: Negative for hearing loss and tinnitus.   Eyes: Negative for blurred vision and double vision.  Respiratory: Negative for cough, shortness of breath and wheezing.   Cardiovascular: Negative for chest pain, palpitations, orthopnea, claudication and leg swelling.  Gastrointestinal: Negative for abdominal pain, blood in stool, constipation, diarrhea, heartburn, melena, nausea and vomiting.  Genitourinary: Negative.   Musculoskeletal: Negative for joint pain and myalgias.  Skin: Negative for rash.  Neurological: Negative for dizziness, tingling, sensory change, weakness and headaches.  Endo/Heme/Allergies: Negative for polydipsia.  Psychiatric/Behavioral: Positive for depression. Negative for substance abuse and suicidal ideas. The patient has insomnia. The patient is not nervous/anxious.   All other systems reviewed and are negative.     Physical Exam: BP 130/72   Pulse 82   Temp (!) 97 F (36.1 C)   Ht 5' 7.5" (1.715 m)   Wt 246 lb (111.6 kg)   SpO2 99%  BMI 37.96 kg/m  Wt Readings from Last 3 Encounters:  05/26/18 246 lb (111.6 kg)  03/31/18 233 lb (105.7 kg)  02/13/18 238 lb 6.4 oz (108.1 kg)   General Appearance: Well nourished, in no apparent distress. Eyes: PERRLA, EOMs, conjunctiva no swelling or erythema Sinuses: No Frontal/maxillary tenderness ENT/Mouth: Ext aud canals clear, TMs without erythema, bulging. No erythema, swelling, or exudate on post pharynx.  Tonsils not swollen or erythematous. Hearing normal.  Neck: Supple, thyroid normal.  Respiratory: Respiratory effort normal, BS equal bilaterally without rales, rhonchi, wheezing or stridor.  Cardio: RRR with no MRGs. 2+  peripheral edema bilaterally; LE pulses difficult to locate - 1+ bilaterally  Abdomen: Soft, + BS.  Non tender, no guarding, rebound, hernias, masses. Lymphatics: Non tender without lymphadenopathy.  Musculoskeletal: Full ROM, 5/5 strength, Normal gait Skin: Warm, dry without rashes, lesions, ecchymosis.  Neuro: Cranial nerves intact. No cerebellar symptoms.  Psych: Awake and oriented X 3, normal affect, Insight and Judgment appropriate.    Dan Maker, NP 4:00 PM [facility]

## 2018-05-26 NOTE — Patient Instructions (Addendum)
DO COLOGUARD - put in bathroom so you don't forget   Schedule diabetic eye exam   STOP zetia  START rosuvastatin 5 mg once weekly - take in the evening. If tolerating after a few weeks try increasing to twice a week. Continue to increase every few weeks as tolerated - stop at every other day.   Increase hydrochlorothiazide to 25 mg daily   Start wearing compression hose  Walk daily -   Buspirone/buspar - 5-10 mg twice a day for anxiety (can do as needed only)  Phentermine 1/2-1 tab in the morning for appetite  Topamax - 25 mg before bedtime - for weight loss   COMPRESSION STOCKINGS  HOME CARE INSTRUCTIONS   Do not stand or sit in one position for long periods of time. Do not sit with your legs crossed. Rest with your legs raised during the day.  Your legs have to be higher than your heart so that gravity will force the valves to open, so please really elevate your legs.   Wear elastic stockings or support hose. Do not wear other tight, encircling garments around the legs, pelvis, or waist.  [employer]  has a wide variety of well priced compression stockings. [address] [place], [place] [address] [phone]  Or [employer] has a good cheap selection, I like the socks, they are not as hard to get on  Walk as much as possible to increase blood flow.  Raise the foot of your bed at night with 2-inch blocks. SEEK MEDICAL CARE IF:   The skin around your ankle starts to break down.  You have pain, redness, tenderness, or hard swelling developing in your leg over a vein.  You are uncomfortable due to leg pain. Document Released: 10/11/2004 Document Revised: 03/26/2011 Document Reviewed: 02/27/2010 [facility] Patient Information 2014 [place], [place].     Phentermine tablets or capsules What is this medicine? PHENTERMINE (FEN ter meen) decreases your appetite. It is used with a reduced calorie diet and exercise to help you lose weight. This medicine may be used  for other purposes; ask your health care provider or pharmacist if you have questions. COMMON BRAND NAME(S): Adipex-P, Atti-Plex P, Atti-Plex P Spansule, Fastin, Lomaira, Pro-Fast, Tara-8 What should I tell my health care provider before I take this medicine? They need to know if you have any of these conditions: -agitation or nervousness -diabetes -glaucoma -heart disease -high blood pressure -history of drug abuse or addiction -history of stroke -kidney disease -lung disease called Primary Pulmonary Hypertension (PPH) -taken an MAOI like Carbex, Eldepryl, Marplan, Nardil, or Parnate in last 14 days -taking stimulant medicines for attention disorders, weight loss, or to stay awake -thyroid disease -an unusual or allergic reaction to phentermine, other medicines, foods, dyes, or preservatives -pregnant or trying to get pregnant -breast-feeding How should I use this medicine? Take this medicine by mouth with a glass of water. Follow the directions on the prescription label. The instructions for use may differ based on the product and dose you are taking. Avoid taking this medicine in the evening. It may interfere with sleep. Take your doses at regular intervals. Do not take your medicine more often than directed. Talk to your pediatrician regarding the use of this medicine in children. While this drug may be prescribed for children 17 years or older for selected conditions, precautions do apply. Overdosage: If you think you have taken too much of this medicine contact a poison control center or emergency room at once.  NOTE: This medicine is only for you. Do not share this medicine with others. What if I miss a dose? If you miss a dose, take it as soon as you can. If it is almost time for your next dose, take only that dose. Do not take double or extra doses. What may interact with this medicine? Do not take this medicine with any of the following medications: -MAOIs like Carbex, Eldepryl,  Marplan, Nardil, and Parnate -medicines for colds or breathing difficulties like pseudoephedrine or phenylephrine -procarbazine -sibutramine -stimulant medicines for attention disorders, weight loss, or to stay awake This medicine may also interact with the following medications: -certain medicines for depression, anxiety, or psychotic disturbances -linezolid -medicines for diabetes -medicines for high blood pressure This list may not describe all possible interactions. Give your health care provider a list of all the medicines, herbs, non-prescription drugs, or dietary supplements you use. Also tell them if you smoke, drink alcohol, or use illegal drugs. Some items may interact with your medicine. What should I watch for while using this medicine? Notify your physician immediately if you become short of breath while doing your normal activities. Do not take this medicine within 6 hours of bedtime. It can keep you from getting to sleep. Avoid drinks that contain caffeine and try to stick to a regular bedtime every night. This medicine was intended to be used in addition to a healthy diet and exercise. The best results are achieved this way. This medicine is only indicated for short-term use. Eventually your weight loss may level out. At that point, the drug will only help you maintain your new weight. Do not increase or in any way change your dose without consulting your doctor. You may get drowsy or dizzy. Do not drive, use machinery, or do anything that needs mental alertness until you know how this medicine affects you. Do not stand or sit up quickly, especially if you are an older patient. This reduces the risk of dizzy or fainting spells. Alcohol may increase dizziness and drowsiness. Avoid alcoholic drinks. What side effects may I notice from receiving this medicine? Side effects that you should report to your doctor or health care professional as soon as possible: -allergic reactions like  skin rash, itching or hives, swelling of the face, lips, or tongue) -anxiety -breathing problems -changes in vision -chest pain or chest tightness -depressed mood or other mood changes -hallucinations, loss of contact with reality -fast, irregular heartbeat -increased blood pressure -irritable -nervousness or restlessness -painful urination -palpitations -tremors -trouble sleeping -seizures -signs and symptoms of a stroke like changes in vision; confusion; trouble speaking or understanding; severe headaches; sudden numbness or weakness of the face, arm or leg; trouble walking; dizziness; loss of balance or coordination -unusually weak or tired -vomiting Side effects that usually do not require medical attention (report to your doctor or health care professional if they continue or are bothersome): -constipation or diarrhea -dry mouth -headache -nausea -stomach upset -sweating This list may not describe all possible side effects. Call your doctor for medical advice about side effects. You may report side effects to FDA at 1-800-FDA-1088. Where should I keep my medicine? Keep out of the reach of children. This medicine can be abused. Keep your medicine in a safe place to protect it from theft. Do not share this medicine with anyone. Selling or giving away this medicine is dangerous and against the law. This medicine may cause accidental overdose and death if taken by other adults, children, or pets.  Mix any unused medicine with a substance like cat litter or coffee grounds. Then throw the medicine away in a sealed container like a sealed bag or a coffee can with a lid. Do not use the medicine after the expiration date. Store at room temperature between 20 and 25 degrees C (68 and 77 degrees F). Keep container tightly closed. NOTE: This sheet is a summary. It may not cover all possible information. If you have questions about this medicine, talk to your doctor, pharmacist, or health care  provider.  2019 Elsevier/Gold Standard (2016-06-15 08:23:13)     Topiramate tablets (prescribed for weight loss benefits) What is this medicine? TOPIRAMATE (toe PYRE a mate) is used to treat seizures in adults or children with epilepsy. It is also used for the prevention of migraine headaches. This medicine may be used for other purposes; ask your health care provider or pharmacist if you have questions. COMMON BRAND NAME(S): Topamax, Topiragen What should I tell my health care provider before I take this medicine? They need to know if you have any of these conditions: -bleeding disorders -cirrhosis of the liver or liver disease -diarrhea -glaucoma -kidney stones or kidney disease -low blood counts, like low white cell, platelet, or red cell counts -lung disease like asthma, obstructive pulmonary disease, emphysema -metabolic acidosis -on a ketogenic diet -schedule for surgery or a procedure -suicidal thoughts, plans, or attempt; a previous suicide attempt by you or a family member -an unusual or allergic reaction to topiramate, other medicines, foods, dyes, or preservatives -pregnant or trying to get pregnant -breast-feeding How should I use this medicine? Take this medicine by mouth with a glass of water. Follow the directions on the prescription label. Do not crush or chew. You may take this medicine with meals. Take your medicine at regular intervals. Do not take it more often than directed. Talk to your pediatrician regarding the use of this medicine in children. Special care may be needed. While this drug may be prescribed for children as young as 63 years of age for selected conditions, precautions do apply. Overdosage: If you think you have taken too much of this medicine contact a poison control center or emergency room at once. NOTE: This medicine is only for you. Do not share this medicine with others. What if I miss a dose? If you miss a dose, take it as soon as you can.  If your next dose is to be taken in less than 6 hours, then do not take the missed dose. Take the next dose at your regular time. Do not take double or extra doses. What may interact with this medicine? Do not take this medicine with any of the following medications: -probenecid This medicine may also interact with the following medications: -acetazolamide -alcohol -amitriptyline -aspirin and aspirin-like medicines -birth control pills -certain medicines for depression -certain medicines for seizures -certain medicines that treat or prevent blood clots like warfarin, enoxaparin, dalteparin, apixaban, dabigatran, and rivaroxaban -digoxin -hydrochlorothiazide -lithium -medicines for pain, sleep, or muscle relaxation -metformin -methazolamide -NSAIDS, medicines for pain and inflammation, like ibuprofen or naproxen -pioglitazone -risperidone This list may not describe all possible interactions. Give your health care provider a list of all the medicines, herbs, non-prescription drugs, or dietary supplements you use. Also tell them if you smoke, drink alcohol, or use illegal drugs. Some items may interact with your medicine. What should I watch for while using this medicine? Visit your doctor or health care professional for regular checks on your progress. Do  not stop taking this medicine suddenly. This increases the risk of seizures if you are using this medicine to control epilepsy. Wear a medical identification bracelet or chain to say you have epilepsy or seizures, and carry a card that lists all your medicines. This medicine can decrease sweating and increase your body temperature. Watch for signs of deceased sweating or fever, especially in children. Avoid extreme heat, hot baths, and saunas. Be careful about exercising, especially in hot weather. Contact your health care provider right away if you notice a fever or decrease in sweating. You should drink plenty of fluids while taking this  medicine. If you have had kidney stones in the past, this will help to reduce your chances of forming kidney stones. If you have stomach pain, with nausea or vomiting and yellowing of your eyes or skin, call your doctor immediately. You may get drowsy, dizzy, or have blurred vision. Do not drive, use machinery, or do anything that needs mental alertness until you know how this medicine affects you. To reduce dizziness, do not sit or stand up quickly, especially if you are an older patient. Alcohol can increase drowsiness and dizziness. Avoid alcoholic drinks. If you notice blurred vision, eye pain, or other eye problems, seek medical attention at once for an eye exam. The use of this medicine may increase the chance of suicidal thoughts or actions. Pay special attention to how you are responding while on this medicine. Any worsening of mood, or thoughts of suicide or dying should be reported to your health care professional right away. This medicine may increase the chance of developing metabolic acidosis. If left untreated, this can cause kidney stones, bone disease, or slowed growth in children. Symptoms include breathing fast, fatigue, loss of appetite, irregular heartbeat, or loss of consciousness. Call your doctor immediately if you experience any of these side effects. Also, tell your doctor about any surgery you plan on having while taking this medicine since this may increase your risk for metabolic acidosis. Birth control pills may not work properly while you are taking this medicine. Talk to your doctor about using an extra method of birth control. Women who become pregnant while using this medicine may enroll in the [place] American Antiepileptic Drug Pregnancy Registry by calling ([phone]. This registry collects information about the safety of antiepileptic drug use during pregnancy. What side effects may I notice from receiving this medicine? Side effects that you should report to your  doctor or health care professional as soon as possible: -allergic reactions like skin rash, itching or hives, swelling of the face, lips, or tongue -decreased sweating and/or rise in body temperature -depression -difficulty breathing, fast or irregular breathing patterns -difficulty speaking -difficulty walking or controlling muscle movements -hearing impairment -redness, blistering, peeling or loosening of the skin, including inside the mouth -tingling, pain or numbness in the hands or feet -unusual bleeding or bruising -unusually weak or tired -worsening of mood, thoughts or actions of suicide or dying Side effects that usually do not require medical attention (report to your doctor or health care professional if they continue or are bothersome): -altered taste -back pain, joint or muscle aches and pains -diarrhea, or constipation -headache -loss of appetite -nausea -stomach upset, indigestion -tremors This list may not describe all possible side effects. Call your doctor for medical advice about side effects. You may report side effects to FDA at 1-800-FDA-1088. Where should I keep my medicine? Keep out of the reach of children. Store at room temperature between 15  and 30 degrees C (59 and 86 degrees F) in a tightly closed container. Protect from moisture. Throw away any unused medicine after the expiration date. NOTE: This sheet is a summary. It may not cover all possible information. If you have questions about this medicine, talk to your doctor, pharmacist, or health care provider.  2019 Elsevier/Gold Standard (2013-01-05 23:17:57)

## 2018-05-27 ENCOUNTER — Other Ambulatory Visit: Payer: Self-pay | Admitting: Adult Health

## 2018-05-27 DIAGNOSIS — M62830 Muscle spasm of back: Secondary | ICD-10-CM

## 2018-05-27 LAB — TSH: TSH: 3.62 mIU/L

## 2018-05-27 LAB — CBC WITH DIFFERENTIAL/PLATELET
Absolute Monocytes: 644 cells/uL (ref 200–950)
Basophils Absolute: 117 cells/uL (ref 0–200)
Basophils Relative: 1 %
Eosinophils Absolute: 304 cells/uL (ref 15–500)
Eosinophils Relative: 2.6 %
HCT: 36.1 % (ref 35.0–45.0)
Hemoglobin: 12.1 g/dL (ref 11.7–15.5)
Lymphs Abs: 3370 cells/uL (ref 850–3900)
MCH: 28.5 pg (ref 27.0–33.0)
MCHC: 33.5 g/dL (ref 32.0–36.0)
MCV: 84.9 fL (ref 80.0–100.0)
MPV: 9.9 fL (ref 7.5–12.5)
Monocytes Relative: 5.5 %
Neutro Abs: 7266 cells/uL (ref 1500–7800)
Neutrophils Relative %: 62.1 %
Platelets: 385 10*3/uL (ref 140–400)
RBC: 4.25 10*6/uL (ref 3.80–5.10)
RDW: 12.8 % (ref 11.0–15.0)
Total Lymphocyte: 28.8 %
WBC: 11.7 10*3/uL — ABNORMAL HIGH (ref 3.8–10.8)

## 2018-05-27 LAB — COMPLETE METABOLIC PANEL WITH GFR
AG Ratio: 1.5 (calc) (ref 1.0–2.5)
ALT: 14 U/L (ref 6–29)
AST: 12 U/L (ref 10–35)
Albumin: 4.1 g/dL (ref 3.6–5.1)
Alkaline phosphatase (APISO): 62 U/L (ref 37–153)
BUN: 15 mg/dL (ref 7–25)
CO2: 30 mmol/L (ref 20–32)
Calcium: 9.7 mg/dL (ref 8.6–10.4)
Chloride: 100 mmol/L (ref 98–110)
Creat: 0.59 mg/dL (ref 0.50–1.05)
GFR, Est African American: 124 mL/min/{1.73_m2} (ref >=60)
GFR, Est Non African American: 107 mL/min/{1.73_m2} (ref >=60)
Globulin: 2.8 g/dL (calc) (ref 1.9–3.7)
Glucose, Bld: 209 mg/dL — ABNORMAL HIGH (ref 65–99)
Potassium: 4.3 mmol/L (ref 3.5–5.3)
Sodium: 137 mmol/L (ref 135–146)
Total Bilirubin: 0.2 mg/dL (ref 0.2–1.2)
Total Protein: 6.9 g/dL (ref 6.1–8.1)

## 2018-05-27 LAB — HEMOGLOBIN A1C
Hgb A1c MFr Bld: 9.9 % of total Hgb — ABNORMAL HIGH (ref <5.7)
Mean Plasma Glucose: 237 (calc)
eAG (mmol/L): 13.2 (calc)

## 2018-05-27 LAB — LIPID PANEL
Cholesterol: 205 mg/dL — ABNORMAL HIGH (ref <200)
HDL: 74 mg/dL (ref >=50)
LDL Cholesterol (Calc): 104 mg/dL (calc) — ABNORMAL HIGH
Non-HDL Cholesterol (Calc): 131 mg/dL (calc) — ABNORMAL HIGH (ref <130)
Total CHOL/HDL Ratio: 2.8 (calc) (ref <5.0)
Triglycerides: 157 mg/dL — ABNORMAL HIGH (ref <150)

## 2018-05-27 LAB — VITAMIN D 25 HYDROXY (VIT D DEFICIENCY, FRACTURES): Vit D, 25-Hydroxy: 30 ng/mL (ref 30–100)

## 2018-05-27 LAB — MAGNESIUM: Magnesium: 1.6 mg/dL (ref 1.5–2.5)

## 2018-05-28 ENCOUNTER — Other Ambulatory Visit: Payer: Self-pay | Admitting: Adult Health

## 2018-05-28 MED ORDER — EMPAGLIFLOZIN 25 MG PO TABS: 25.0000 mg | ORAL_TABLET | Freq: Every day | ORAL | 1 refills | Status: DC

## 2018-06-20 ENCOUNTER — Other Ambulatory Visit: Payer: Self-pay | Admitting: Adult Health

## 2018-06-20 DIAGNOSIS — E1165 Type 2 diabetes mellitus with hyperglycemia: Secondary | ICD-10-CM

## 2018-06-30 NOTE — Progress Notes (Deleted)
Assessment and Plan:  1. Obesity (BMI 30.0-34.9) ***  2. Essential hypertension ***  3. Type 2 diabetes mellitus with hyperglycemia, without long-term current use of insulin (HCC) ***     Further disposition pending results of labs. Discussed med's effects and SE's.   Over 30 minutes of exam, counseling, chart review, and critical decision making was performed.   Future Appointments  Date Time Provider [address]  07/02/2018  4:00 PM Liane Comber, NP [facility] None  07/31/2018  4:00 PM Liane Comber, NP [facility] None  09/01/2018  3:45 PM Liane Comber, NP [facility] None  03/02/2019  3:00 PM Liane Comber, NP [facility] None    ------------------------------------------------------------------------------------------------------------------   HPI 53 y.o.female presents for 1 month follow up for obesity (newly on phentermine) and for follow up on htn, poorly controlled T2DM.   she is prescribed phentermine and topamax for weight loss.  While on the medication they have lost {NUMBERS 0-12:18577} lbs since last visit. They deny palpitations, anxiety, trouble sleeping, elevated BP.   BMI is There is no height or weight on file to calculate BMI., she is working on diet and exercise. Wt Readings from Last 3 Encounters:  05/26/18 246 lb (111.6 kg)  03/31/18 233 lb (105.7 kg)  02/13/18 238 lb 6.4 oz (108.1 kg)   Typical breakfast: Typical lunch:  Typical dinner: Exercise:  Water intake:    Her blood pressure {HAS HAS NOT:18834} been controlled at home, today their BP is    She {DOES_DOES ZOX:09604} workout. She denies chest pain, shortness of breath, dizziness.   She {Has/has not:18111} been working on diet and exercise for T2DM (on metformin, amaryl, newly on jardiance ***), and denies {Symptoms; diabetes w/o none:19199}. Last A1C in the office was:  Lab Results  Component Value Date   HGBA1C 9.9 (H) 05/26/2018    Past Medical History:  Diagnosis Date   . Diabetes mellitus without complication ([place])   . Leg pain, left   . 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 Statins Other (See Comments)    myalgias    Current Outpatient Medications on File Prior to Visit  Medication Sig  . aspirin EC 81 MG tablet Take 81 mg by mouth daily.  Marland Kitchen buPROPion (WELLBUTRIN XL) 300 MG 24 hr tablet Take one tablet daily  . busPIRone (BUSPAR) 10 MG tablet Take 1 tablet (10 mg total) by mouth 2 (two) times daily.  . empagliflozin (JARDIANCE) 25 MG TABS tablet Take 25 mg by mouth daily.  Marland Kitchen ezetimibe (ZETIA) 10 MG tablet Take 1 tablet (10 mg total) by mouth daily.  Marland Kitchen glimepiride (AMARYL) 4 MG tablet Take 1/2 to 1 tablet Daily with Breakfast for Diabetes  . glucose blood test strip Use as instructed  . hydrochlorothiazide (HYDRODIURIL) 25 MG tablet Take 1 tablet (25 mg total) by mouth daily.  Marland Kitchen losartan (COZAAR) 25 MG tablet Take 1 tab (25 mg) daily to protect kidneys.  . metFORMIN (GLUCOPHAGE-XR) 500 MG 24 hr tablet TAKE 2 TABLETS(1000 MG) BY MOUTH TWICE DAILY WITH A MEAL  . phentermine (ADIPEX-P) 37.5 MG tablet Take 1/2 to 1 tablet every morning for dieting & weightloss  . rosuvastatin (CRESTOR) 5 MG tablet Start taking 1 tab once weekly in the evening for cholesterol; increase slowly as discussed/tolerated to every other day.  . terbinafine (LAMISIL) 250 MG tablet Take one daily for 1 month, then stop for 1 month; repeat for 3 cycles.  Marland Kitchen tiZANidine (ZANAFLEX) 4 MG capsule TAKE  1 CAPSULE(4 MG) BY MOUTH THREE TIMES DAILY AS NEEDED FOR MUSCLE SPASMS  . topiramate (TOPAMAX) 25 MG tablet Take 1 tablet (25 mg total) by mouth at bedtime. For weight loss.   No current facility-administered medications on file prior to visit.     ROS: all negative except above.   Physical Exam:  There were no vitals taken for this visit.  General Appearance: Well nourished, in no apparent distress. Eyes: PERRLA, EOMs, conjunctiva no swelling or  erythema Sinuses: No Frontal/maxillary tenderness ENT/Mouth: Ext aud canals clear, TMs without erythema, bulging. No erythema, swelling, or exudate on post pharynx.  Tonsils not swollen or erythematous. Hearing normal.  Neck: Supple, thyroid normal.  Respiratory: Respiratory effort normal, BS equal bilaterally without rales, rhonchi, wheezing or stridor.  Cardio: RRR with no MRGs. Brisk peripheral pulses without edema.  Abdomen: Soft, + BS.  Non tender, no guarding, rebound, hernias, masses. Lymphatics: Non tender without lymphadenopathy.  Musculoskeletal: Full ROM, 5/5 strength, normal gait.  Skin: Warm, dry without rashes, lesions, ecchymosis.  Neuro: Cranial nerves intact. Normal muscle tone, no cerebellar symptoms. Sensation intact.  Psych: Awake and oriented X 3, normal affect, Insight and Judgment appropriate.     Dan MakerAshley C Kenlea Woodell, NP 9:05 AM [facility]

## 2018-07-02 ENCOUNTER — Ambulatory Visit: Payer: BC Managed Care – PPO | Admitting: Adult Health

## 2018-07-21 ENCOUNTER — Encounter: Payer: Self-pay | Admitting: Adult Health

## 2018-07-30 NOTE — Progress Notes (Signed)
Assessment and Plan:  Danielle Mccall was seen today for follow-up.  Diagnoses and all orders for this visit:  Essential hypertension Continue medications Monitor blood pressure at home; call if consistently over 130/80 Continue DASH diet.   Reminder to go to the ER if any CP, SOB, nausea, dizziness, severe HA, changes vision/speech, left arm numbness and tingling and jaw pain.  Morbid obesity Improved with topamax 25 mg daily; hasn't started phentermine but plans to do so Long discussion about weight loss, diet, and exercise Recommended diet heavy in fruits and veggies and low in animal meats, cheeses, and dairy products, appropriate calorie intake Suggested mediterranean diet which the patient enjoys Follow up at next visit  Major depressive disorder in partial remission, unspecified whether recurrent (HCC) STOP buspar, continue wellbutrin, start low dose sertraline, increase in 2 weeks if tolerating Follow up as scheduled or sooner if concerns with SE Lifestyle discussed: diet/exerise, sleep hygiene, stress management, hydration -     sertraline (ZOLOFT) 100 MG tablet; Take 1/2 tab x 2 weeks, then increase to whole tab daily.  Type 2 diabetes mellitus with hyperglycemia, without long-term current use of insulin (HCC) Continue metformin, amaryl, jardiance at max doses respectively  Reported fasting glucose values much imporved; 100-130 goal, currently reporting 100-140 Education: Reviewed 'ABCs' of diabetes management (respective goals in parentheses):  A1C (<7), blood pressure (<130/80), and cholesterol (LDL <70) Eye Exam yearly and Dental Exam every 6 months. Dietary recommendations Physical Activity recommendations  TO ADD ON WALKING, START LOW AT 20 MINS 2 DAYS A WEEK  Hyperlipidemia associated with type 2 diabetes mellitus (HCC) Tolerating rosuvastatin 5 mg daily; stopped zetia 10 mg Anticipate significant improvement with this switch Check lipids and upcoming 3MOV  Shoulder  stiffness, left Persisting 8 weeks, significant limiter with her work, refer for ortho evaluation after discussion per her preference -     Ambulatory referral to Orthopedics   Further disposition pending results of labs. Discussed med's effects and SE's.   Over 30 minutes of exam, counseling, chart review, and critical decision making was performed.   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 Limits her at work.   she is prescribed phentermine and topamax for weight loss, hasn't started the phentermine. While on the medication they have lost 8 lbs since last visit. They deny palpitations, anxiety, trouble sleeping, elevated BP.   BMI is Body mass index is 36.73 kg/m., she is working on diet and exercise. Wt Readings from Last 3 Encounters:  07/31/18 238 lb (108 kg)  05/26/18 246 lb (111.6 kg)  03/31/18 233 lb (105.7 kg)   Typical breakfast: skipping, 1 cup of coffee, splenda and powdered (4-5 heaping spoonfuls) Typical lunch: has whatever she cooked at school, commonly has pizza, chicken nuggets, corn dogs, hamburgers Typical dinner: varies  Exercise: started walking, then stopped with curfew and hasn't restarted Water intake:   Her blood pressure has been controlled at home, today their BP  is BP: 116/60  She  does not workout. She denies chest pain, shortness of breath, dizziness.   She has been working on diet and exercise for T2DM (on metformin 2000 mg daily, amaryl 8 mg daily, newly on jardiance 25 mg daily), and denies hypoglycemia , nausea, paresthesia of the feet, polydipsia, polyuria and visual disturbances. Does check fasting, ranges 100-140. Last A1C in the office was:  Lab Results  Component Value Date   HGBA1C 9.9 (H) 05/26/2018    She is on cholesterol medication (zetia 10 mg daily, newly trialing rosuvastatin 5 mg once ) and denies myalgias. Her cholesterol is not at goal. The cholesterol last visit was:   Lab Results  Component Value Date   CHOL 205 (H) 05/26/2018   HDL 74 05/26/2018   LDLCALC 104 (H) 05/26/2018   TRIG 157 (H) 05/26/2018   CHOLHDL 2.8 05/26/2018   she has a diagnosis of depression/anxiety and is currently on wellbutrin 300 mg daily, newly added buspar 10 mg BID but felt terrible, reports symptoms are not well controlled on current regimen. she was previously on zoloft.     Past Medical History:  Diagnosis Date  . Diabetes mellitus without complication ([place])   . Leg pain, left   . 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 Buspirone     "felt like I was going to pass out"   . Statins Other (See Comments)    myalgias    Current Outpatient Medications on File Prior to Visit  Medication Sig  . buPROPion (WELLBUTRIN XL) 300 MG 24 hr tablet Take one tablet daily  . empagliflozin (JARDIANCE) 25 MG TABS tablet Take 25 mg by mouth daily.  Marland Kitchen glimepiride (AMARYL) 4 MG tablet Take 1/2 to 1 tablet Daily with Breakfast for Diabetes  . glucose blood test strip Use as instructed  . hydrochlorothiazide (HYDRODIURIL) 25 MG tablet Take 1 tablet (25 mg total) by mouth daily.  Marland Kitchen losartan (COZAAR) 25 MG tablet Take 1 tab (25 mg) daily to protect kidneys.  . metFORMIN (GLUCOPHAGE-XR) 500 MG 24 hr tablet TAKE 2 TABLETS(1000 MG) BY MOUTH TWICE DAILY  WITH A MEAL  . rosuvastatin (CRESTOR) 5 MG tablet Start taking 1 tab once weekly in the evening for cholesterol; increase slowly as discussed/tolerated to every other day.  . terbinafine (LAMISIL) 250 MG tablet Take one daily for 1 month, then stop for 1 month; repeat for 3 cycles.  Marland Kitchen tiZANidine (ZANAFLEX) 4 MG capsule TAKE 1 CAPSULE(4 MG) BY MOUTH THREE TIMES DAILY AS NEEDED FOR MUSCLE SPASMS  . topiramate (TOPAMAX) 25 MG tablet Take 1 tablet (25 mg total) by mouth at bedtime. For weight loss.  Marland Kitchen aspirin EC 81 MG tablet Take 81 mg by mouth daily.  . phentermine (ADIPEX-P) 37.5 MG tablet Take 1/2 to 1 tablet every morning for dieting & weightloss (Patient not taking: Reported on 07/31/2018)   No current facility-administered medications on file prior to visit.     ROS: all negative except above.   Physical Exam:  BP 116/60   Pulse 80   Temp (!) 97 F (36.1 C)   Ht 5' 7.5" (1.715 m)   Wt 238 lb (108 kg)   SpO2 98%   BMI 36.73 kg/m   General Appearance: Well nourished, in no apparent distress. Eyes: PERRLA, EOMs, conjunctiva no swelling or erythema Sinuses: No Frontal/maxillary tenderness ENT/Mouth: Ext aud canals clear, TMs without erythema, bulging. No erythema, swelling, or exudate on post  pharynx.  Tonsils not swollen or erythematous. Hearing normal.  Neck: Supple, thyroid normal.  Respiratory: Respiratory effort normal, BS equal bilaterally without rales, rhonchi, wheezing or stridor.  Cardio: RRR with no MRGs. Brisk peripheral pulses without edema.  Abdomen: Soft, + BS.  Non tender, no guarding, rebound, hernias, masses. Lymphatics: Non tender without lymphadenopathy.  Musculoskeletal:  symmetrical strength, normal gait. Left shoulder with limited adduction and posterior extension with lateral/deltoid pain; no palpable abnormality, crepitus, effusion, popping; can drape arm over head Skin: Warm, dry without rashes, lesions, ecchymosis.  Neuro: Cranial nerves intact. Normal  muscle tone, no cerebellar symptoms. Sensation intact.  Psych: Awake and oriented X 3, normal affect, Insight and Judgment appropriate.     Dan MakerAshley C Cheron Pasquarelli, NP 5:08 PM [facility]

## 2018-07-31 ENCOUNTER — Ambulatory Visit: Payer: BC Managed Care – PPO | Admitting: Adult Health

## 2018-07-31 ENCOUNTER — Encounter: Payer: Self-pay | Admitting: Adult Health

## 2018-07-31 ENCOUNTER — Other Ambulatory Visit: Payer: Self-pay

## 2018-07-31 VITALS — BP 116/60 | HR 80 | Temp 97.0°F | Ht 67.5 in | Wt 238.0 lb

## 2018-07-31 DIAGNOSIS — F324 Major depressive disorder, single episode, in partial remission: Secondary | ICD-10-CM | POA: Diagnosis not present

## 2018-07-31 DIAGNOSIS — I10 Essential (primary) hypertension: Secondary | ICD-10-CM | POA: Diagnosis not present

## 2018-07-31 DIAGNOSIS — E1165 Type 2 diabetes mellitus with hyperglycemia: Secondary | ICD-10-CM | POA: Diagnosis not present

## 2018-07-31 DIAGNOSIS — E785 Hyperlipidemia, unspecified: Secondary | ICD-10-CM

## 2018-07-31 DIAGNOSIS — E1169 Type 2 diabetes mellitus with other specified complication: Secondary | ICD-10-CM

## 2018-07-31 DIAGNOSIS — M25612 Stiffness of left shoulder, not elsewhere classified: Secondary | ICD-10-CM

## 2018-07-31 MED ORDER — SERTRALINE HCL 100 MG PO TABS: ORAL_TABLET | ORAL | 0 refills | Status: DC

## 2018-07-31 NOTE — Patient Instructions (Addendum)
Goals    . Blood Pressure < 130/80    . DIET - DECREASE SODA OR JUICE INTAKE    . Exercise 3x per week (30 min per time)    . HEMOGLOBIN A1C < 7.0    . Weight (lb) < 215 lb (97.5 kg)       PICK UP a baby aspirin at the grocery store   [place] - to stick to whole grain bread - [employer] - best if toasted   Try - look into mediterranean cooking - maybe a new cook book to get excited   Try to limit white/processed flour - pizza, pasta, white breads, cakes  PLAN - a good diabetic friendly sweet that you can splurge on -   KEEP soda out of the house -   Start - zoloft 1/2 tab daily with wellbutrin, increase to whole tab in 2 weeks if tolerating    Sertraline tablets What is this medicine? SERTRALINE (SER tra leen) is used to treat depression. It may also be used to treat obsessive compulsive disorder, panic disorder, post-trauma stress, premenstrual dysphoric disorder (PMDD) or social anxiety. This medicine may be used for other purposes; ask your health care provider or pharmacist if you have questions. COMMON BRAND NAME(S): Zoloft What should I tell my health care provider before I take this medicine? They need to know if you have any of these conditions:  bleeding disorders  bipolar disorder or a family history of bipolar disorder  glaucoma  heart disease  high blood pressure  history of irregular heartbeat  history of low levels of calcium, magnesium, or potassium in the blood  if you often drink alcohol  liver disease  receiving electroconvulsive therapy  seizures  suicidal thoughts, plans, or attempt; a previous suicide attempt by you or a family member  take medicines that treat or prevent blood clots  thyroid disease  an unusual or allergic reaction to sertraline, other medicines, foods, dyes, or preservatives  pregnant or trying to get pregnant  breast-feeding How should I use this medicine? Take this medicine by mouth with a glass of water.  Follow the directions on the prescription label. You can take it with or without food. Take your medicine at regular intervals. Do not take your medicine more often than directed. Do not stop taking this medicine suddenly except upon the advice of your doctor. Stopping this medicine too quickly may cause serious side effects or your condition may worsen. A special MedGuide will be given to you by the pharmacist with each prescription and refill. Be sure to read this information carefully each time. Talk to your pediatrician regarding the use of this medicine in children. While this drug may be prescribed for children as young as 7 years for selected conditions, precautions do apply. Overdosage: If you think you have taken too much of this medicine contact a poison control center or emergency room at once. NOTE: This medicine is only for you. Do not share this medicine with others. What if I miss a dose? If you miss a dose, take it as soon as you can. If it is almost time for your next dose, take only that dose. Do not take double or extra doses. What may interact with this medicine? Do not take this medicine with any of the following medications:  cisapride  dronedarone  linezolid  MAOIs like Carbex, Eldepryl, Marplan, Nardil, and Parnate  methylene blue (injected into a vein)  pimozide  thioridazine This medicine may  also interact with the following medications:  alcohol  amphetamines  aspirin and aspirin-like medicines  certain medicines for depression, anxiety, or psychotic disturbances  certain medicines for fungal infections like ketoconazole, fluconazole, posaconazole, and itraconazole  certain medicines for irregular heart beat like flecainide, quinidine, propafenone  certain medicines for migraine headaches like almotriptan, eletriptan, frovatriptan, naratriptan, rizatriptan, sumatriptan, zolmitriptan  certain medicines for sleep  certain medicines for seizures like  carbamazepine, valproic acid, phenytoin  certain medicines that treat or prevent blood clots like warfarin, enoxaparin, dalteparin  cimetidine  digoxin  diuretics  fentanyl  isoniazid  lithium  NSAIDs, medicines for pain and inflammation, like ibuprofen or naproxen  other medicines that prolong the QT interval (cause an abnormal heart rhythm) like dofetilide  rasagiline  safinamide  supplements like St. John's wort, kava kava, valerian  tolbutamide  tramadol  tryptophan This list may not describe all possible interactions. Give your health care provider a list of all the medicines, herbs, non-prescription drugs, or dietary supplements you use. Also tell them if you smoke, drink alcohol, or use illegal drugs. Some items may interact with your medicine. What should I watch for while using this medicine? Tell your doctor if your symptoms do not get better or if they get worse. Visit your doctor or health care professional for regular checks on your progress. Because it may take several weeks to see the full effects of this medicine, it is important to continue your treatment as prescribed by your doctor. Patients and their families should watch out for new or worsening thoughts of suicide or depression. Also watch out for sudden changes in feelings such as feeling anxious, agitated, panicky, irritable, hostile, aggressive, impulsive, severely restless, overly excited and hyperactive, or not being able to sleep. If this happens, especially at the beginning of treatment or after a change in dose, call your health care professional. Bonita QuinYou may get drowsy or dizzy. Do not drive, use machinery, or do anything that needs mental alertness until you know how this medicine affects you. Do not stand or sit up quickly, especially if you are an older patient. This reduces the risk of dizzy or fainting spells. Alcohol may interfere with the effect of this medicine. Avoid alcoholic drinks. Your mouth  may get dry. Chewing sugarless gum or sucking hard candy, and drinking plenty of water may help. Contact your doctor if the problem does not go away or is severe. What side effects may I notice from receiving this medicine? Side effects that you should report to your doctor or health care professional as soon as possible:  allergic reactions like skin rash, itching or hives, swelling of the face, lips, or tongue  anxious  black, tarry stools  changes in vision  confusion  elevated mood, decreased need for sleep, racing thoughts, impulsive behavior  eye pain  fast, irregular heartbeat  feeling faint or lightheaded, falls  feeling agitated, angry, or irritable  hallucination, loss of contact with reality  loss of balance or coordination  loss of memory  painful or prolonged erections  restlessness, pacing, inability to keep still  seizures  stiff muscles  suicidal thoughts or other mood changes  trouble sleeping  unusual bleeding or bruising  unusually weak or tired  vomiting Side effects that usually do not require medical attention (report to your doctor or health care professional if they continue or are bothersome):  change in appetite or weight  change in sex drive or performance  diarrhea  increased sweating  indigestion,  nausea  tremors This list may not describe all possible side effects. Call your doctor for medical advice about side effects. You may report side effects to FDA at 1-800-FDA-1088. Where should I keep my medicine? Keep out of the reach of children. Store at room temperature between 15 and 30 degrees C (59 and 86 degrees F). Throw away any unused medicine after the expiration date. NOTE: This sheet is a summary. It may not cover all possible information. If you have questions about this medicine, talk to your doctor, pharmacist, or health care provider.  2020 [employer]/Gold Standard (2017-12-24 10:09:27)

## 2018-08-14 ENCOUNTER — Ambulatory Visit: Payer: BC Managed Care – PPO | Admitting: Specialist

## 2018-08-14 ENCOUNTER — Other Ambulatory Visit: Payer: Self-pay | Admitting: *Deleted

## 2018-08-14 DIAGNOSIS — E1165 Type 2 diabetes mellitus with hyperglycemia: Secondary | ICD-10-CM

## 2018-08-14 MED ORDER — GLIMEPIRIDE 4 MG PO TABS: ORAL_TABLET | ORAL | 0 refills | Status: DC

## 2018-08-15 ENCOUNTER — Other Ambulatory Visit: Payer: Self-pay | Admitting: Adult Health

## 2018-08-29 NOTE — Progress Notes (Deleted)
Assessment and Plan:  Danielle Mccall was seen today for follow-up.  Diagnoses and all orders for this visit:  Essential hypertension Continue medications Monitor blood pressure at home; call if consistently over 130/80 Continue DASH diet.   Reminder to go to the ER if any CP, SOB, nausea, dizziness, severe HA, changes vision/speech, left arm numbness and tingling and jaw pain.  Morbid obesity Improved with topamax 25 mg daily; phentermine *** Long discussion about weight loss, diet, and exercise Recommended diet heavy in fruits and veggies and low in animal meats, cheeses, and dairy products, appropriate calorie intake Suggested mediterranean diet which the patient enjoys Follow up at next visit  Major depressive disorder in partial remission, unspecified whether recurrent (HCC) Continue current medications: wellbutrin, zoloft *** Lifestyle discussed: diet/exerise, sleep hygiene, stress management, hydration  Type 2 diabetes mellitus with hyperglycemia, without long-term current use of insulin (HCC) Continue metformin, amaryl, jardiance at max doses respectively  Reported fasting glucose values much imporved; <130 goal, currently reporting *** Education: Reviewed 'ABCs' of diabetes management (respective goals in parentheses):  A1C (<7), blood pressure (<130/80), and cholesterol (LDL <70) Eye Exam yearly and Dental Exam every 6 months. Dietary recommendations Physical Activity recommendations  TO ADD ON WALKING, START LOW AT 20 MINS 2 DAYS A WEEK  Hyperlipidemia associated with type 2 diabetes mellitus (HCC) Tolerating rosuvastatin 5 mg daily; stopped zetia 10 mg *** Anticipate significant improvement with this switch Check lipids  Shoulder stiffness, left ***   Further disposition pending results of labs. Discussed med's effects and SE's.   Over 30 minutes of exam, counseling, chart review, and critical decision making was performed.   Future Appointments  Date Time Provider  Department Center  09/01/2018  3:45 PM Judd Gaudierorbett, Bryant Saye, NP [facility] None  09/04/2018  3:15 PM Kerrin ChampagneNitka, James E, MD [facility] None  03/02/2019  3:00 PM Judd Gaudierorbett, Mikya Don, NP [facility] None    ------------------------------------------------------------------------------------------------------------------   HPI 53 y.o.female presents for 3 month follow up on hypertension, cholesterol, diabetes, weight and vitamin D deficiency.  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 While on the medication they have lost 8 lbs since last visit. They deny palpitations, anxiety, trouble sleeping, elevated BP.   BMI is There is no height or weight on file to calculate BMI., Danielle Mccall is working on diet and exercise. *** suggested mediterranean diet *** Wt Readings from Last 3 Encounters:  07/31/18 238 lb (108 kg)  05/26/18 246 lb (111.6 kg)  03/31/18 233 lb (105.7 kg)   Typical breakfast: skipping, 1 cup of coffee, splenda and powdered (4-5 heaping spoonfuls) Typical lunch: has whatever Danielle Mccall cooked at school, commonly has pizza, chicken nuggets, corn dogs, hamburgers Typical dinner: varies  Exercise: started walking, then stopped with curfew and hasn't restarted Water intake:   Her blood pressure has been controlled at home, today their BP is    Danielle Mccall does not workout. Danielle Mccall denies chest pain, shortness of breath, dizziness.  *** oph exam    Danielle Mccall has been working on diet and exercise for T2DM (on metformin 2000 mg daily, amaryl  8 mg daily, jardiance 25 mg daily), and denies hypoglycemia ,  nausea, paresthesia of the feet, polydipsia, polyuria and visual disturbances. Does check fasting, ranges 100-140. Last A1C in the office was:  Lab Results  Component Value Date   HGBA1C 9.9 (H) 05/26/2018    Danielle Mccall is on cholesterol medication (zetia 10 mg daily, newly trialing rosuvastatin 5 mg once ***) and denies myalgias. Her cholesterol is not at goal. The cholesterol last visit was:   Lab Results  Component Value Date   CHOL 205 (H) 05/26/2018   HDL 74 05/26/2018   LDLCALC 104 (H) 05/26/2018   TRIG 157 (H) 05/26/2018   CHOLHDL 2.8 05/26/2018   Lab Results  Component Value Date   GFRNONAA 107 05/26/2018   Patient is on Vitamin D supplement.  *** Lab Results  Component Value Date   VD25OH 30 05/26/2018        Past Medical History:  Diagnosis Date  . Diabetes mellitus without complication ([place])   . Leg pain, left   . 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 Atorvastatin     Myalgias  . Buspirone     "felt like I was going to pass out"     Current Outpatient Medications on File Prior to Visit  Medication Sig  . aspirin EC 81 MG tablet Take 81 mg by mouth daily.  Marland Kitchen buPROPion (WELLBUTRIN XL) 300 MG 24 hr tablet Take one tablet daily  . citalopram (CELEXA) 40 MG tablet Take 1 tablet Daily for Mood  . empagliflozin (JARDIANCE) 25 MG TABS tablet Take 25 mg by mouth daily.  Marland Kitchen ezetimibe (ZETIA) 10 MG tablet Take 1 tablet Daily for Cholesterol  . glimepiride (AMARYL) 4 MG tablet Take 1/2 to 1 tablet Daily with Breakfast for Diabetes  . glucose blood test strip Use as instructed  . hydrochlorothiazide (HYDRODIURIL) 25 MG tablet Take 1 tablet (25 mg total) by mouth daily.  Marland Kitchen losartan (COZAAR) 25 MG tablet Take 1 tab (25 mg) daily to protect kidneys.  . metFORMIN (GLUCOPHAGE-XR) 500 MG 24 hr tablet TAKE 2 TABLETS(1000 MG) BY MOUTH TWICE DAILY WITH A MEAL  . phentermine (ADIPEX-P) 37.5 MG tablet Take 1/2 to 1 tablet every morning for dieting &  weightloss (Patient not taking: Reported on 07/31/2018)  . rosuvastatin (CRESTOR) 5 MG tablet Start taking 1 tab once weekly in the evening for cholesterol; increase slowly as discussed/tolerated to every other day.  . sertraline (ZOLOFT) 100 MG tablet Take 1/2 tab x 2 weeks, then increase to whole tab daily.  Marland Kitchen terbinafine (LAMISIL) 250 MG tablet Take one daily for 1 month, then stop for 1 month; repeat for 3 cycles.  Marland Kitchen tiZANidine (ZANAFLEX) 4 MG capsule TAKE 1 CAPSULE(4 MG) BY MOUTH THREE TIMES DAILY AS NEEDED FOR MUSCLE SPASMS  . topiramate (TOPAMAX) 25 MG tablet Take 1 tablet (25 mg total) by mouth at bedtime. For weight loss.   No current facility-administered medications on file prior to visit.     ROS: Review of Systems  Constitutional: Negative for malaise/fatigue and weight loss.  HENT: Negative for hearing loss and tinnitus.   Eyes: Negative for blurred vision and double vision.  Respiratory: Negative for cough, shortness of breath and wheezing.   Cardiovascular: Negative for chest pain, palpitations, orthopnea, claudication and leg swelling.  Gastrointestinal: Negative for abdominal pain, blood in stool, constipation, diarrhea, heartburn, melena, nausea and vomiting.  Genitourinary: Negative.   Musculoskeletal: Negative for joint pain (shoulder)  and myalgias.  Skin: Negative for rash.  Neurological: Negative for dizziness, tingling, sensory change, weakness and headaches.  Endo/Heme/Allergies: Negative for polydipsia.  Psychiatric/Behavioral: Positive for depression. Negative for substance abuse.  All other systems reviewed and are negative.    Physical Exam:  There were no vitals taken for this visit.  General Appearance: Well nourished, in no apparent distress. Eyes: PERRLA, EOMs, conjunctiva no swelling or erythema Sinuses: No Frontal/maxillary tenderness ENT/Mouth: Ext aud canals clear, TMs without erythema, bulging. No erythema, swelling, or exudate on post pharynx.   Tonsils not swollen or erythematous. Hearing normal.  Neck: Supple, thyroid normal.  Respiratory: Respiratory effort normal, BS equal bilaterally without rales, rhonchi, wheezing or stridor.  Cardio: RRR with no MRGs. Brisk peripheral pulses without edema.  Abdomen: Soft, + BS.  Non tender, no guarding, rebound, hernias, masses. Lymphatics: Non tender without lymphadenopathy.  Musculoskeletal:  symmetrical strength, normal gait. Left shoulder with limited adduction and posterior extension with lateral/deltoid pain; no palpable abnormality, crepitus, effusion, popping; can drape arm over head *** Skin: Warm, dry without rashes, lesions, ecchymosis.  Neuro: Cranial nerves intact. Normal muscle tone, no cerebellar symptoms. Sensation intact.  Psych: Awake and oriented X 3, normal affect, Insight and Judgment appropriate.     Dan MakerAshley C Schneider Warchol, NP 9:59 AM [facility]

## 2018-09-01 ENCOUNTER — Other Ambulatory Visit: Payer: Self-pay | Admitting: Adult Health

## 2018-09-01 ENCOUNTER — Ambulatory Visit: Payer: Self-pay | Admitting: Adult Health

## 2018-09-01 DIAGNOSIS — F324 Major depressive disorder, single episode, in partial remission: Secondary | ICD-10-CM

## 2018-09-04 ENCOUNTER — Ambulatory Visit: Payer: BC Managed Care – PPO | Admitting: Specialist

## 2018-09-08 ENCOUNTER — Encounter: Payer: Self-pay | Admitting: Adult Health

## 2018-09-09 NOTE — Progress Notes (Deleted)
Assessment and Plan:  Manika was seen today for follow-up.  Diagnoses and all orders for this visit:  Essential hypertension Continue medications Monitor blood pressure at home; call if consistently over 130/80 Continue DASH diet.   Reminder to go to the ER if any CP, SOB, nausea, dizziness, severe HA, changes vision/speech, left arm numbness and tingling and jaw pain.  Morbid obesity Improved with topamax 25 mg daily; phentermine *** Long discussion about weight loss, diet, and exercise Recommended diet heavy in fruits and veggies and low in animal meats, cheeses, and dairy products, appropriate calorie intake Suggested mediterranean diet which the patient enjoys Follow up at next visit  Major depressive disorder in partial remission, unspecified whether recurrent ([place]) Continue current medications: wellbutrin, zoloft *** Lifestyle discussed: diet/exerise, sleep hygiene, stress management, hydration  Type 2 diabetes mellitus with hyperglycemia, without long-term current use of insulin (HCC) Continue metformin, amaryl, jardiance at max doses respectively  Reported fasting glucose values much imporved; <130 goal, currently reporting *** Education: Reviewed 'ABCs' of diabetes management (respective goals in parentheses):  A1C (<7), blood pressure (<130/80), and cholesterol (LDL <70) Eye Exam yearly and Dental Exam every 6 months. Dietary recommendations Physical Activity recommendations  TO ADD ON WALKING, START LOW AT 20 MINS 2 DAYS A WEEK  Hyperlipidemia associated with type 2 diabetes mellitus (HCC) Tolerating rosuvastatin 5 mg daily; stopped zetia 10 mg *** Anticipate significant improvement with this switch Check lipids  Shoulder stiffness, left ***   Further disposition pending results of labs. Discussed med's effects and SE's.   Over 30 minutes of exam, counseling, chart review, and critical decision making was performed.   Future Appointments  Date Time Provider  [place]  09/10/2018 10:45 AM Liane Comber, NP [facility] None  10/03/2018  9:45 AM Jessy Oto, MD [facility] None  03/02/2019  3:00 PM Liane Comber, NP [facility] None    ------------------------------------------------------------------------------------------------------------------   HPI 53 y.o.female presents for 3 month follow up on hypertension, cholesterol, diabetes, weight and vitamin D deficiency.  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 While on the medication they have lost 8 lbs since last visit. They deny palpitations, anxiety, trouble sleeping, elevated BP.   BMI is There is no height or weight on file to calculate BMI., she is working on diet and exercise. *** suggested mediterranean diet *** Wt Readings from Last 3 Encounters:  07/31/18 238 lb (108 kg)  05/26/18 246 lb (111.6 kg)  03/31/18 233 lb (105.7 kg)   Typical breakfast: skipping, 1 cup of coffee, splenda and powdered (4-5 heaping spoonfuls) Typical lunch: has whatever she cooked at school, commonly has pizza, chicken nuggets, corn dogs, hamburgers Typical dinner: varies  Exercise: started walking, then stopped with curfew and hasn't restarted Water intake:   Her blood pressure has been controlled at home, today their BP is    She does not workout. She denies chest pain, shortness of breath, dizziness.  *** oph exam    She has been working on diet and exercise for T2DM (on metformin 2000 mg daily, amaryl 8  mg daily, jardiance 25 mg daily), and denies hypoglycemia ,  nausea, paresthesia of the feet, polydipsia, polyuria and visual disturbances. Does check fasting, ranges 100-140. Last A1C in the office was:  Lab Results  Component Value Date   HGBA1C 9.9 (H) 05/26/2018    She is on cholesterol medication (zetia 10 mg daily, newly trialing rosuvastatin 5 mg once ***) and denies myalgias. Her cholesterol is not at goal. The cholesterol last visit was:   Lab Results  Component Value Date   CHOL 205 (H) 05/26/2018   HDL 74 05/26/2018   LDLCALC 104 (H) 05/26/2018   TRIG 157 (H) 05/26/2018   CHOLHDL 2.8 05/26/2018   Lab Results  Component Value Date   GFRNONAA 107 05/26/2018   Patient is on Vitamin D supplement.  *** Lab Results  Component Value Date   VD25OH 30 05/26/2018        Past Medical History:  Diagnosis Date  . Diabetes mellitus without complication (HCC)   . Leg pain, left   . 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 Atorvastatin     Myalgias  . Buspirone     "felt like I was going to pass out"     Current Outpatient Medications on File Prior to Visit  Medication Sig  . aspirin EC 81 MG tablet Take 81 mg by mouth daily.  Marland Kitchen buPROPion (WELLBUTRIN XL) 150 MG 24 hr tablet START TAKE 1 TABLET BY MOUTH DAILY IN THE MORNING FOR MOOD;CAN INCREASE TO 2 TABLET AFTER 30 DAYS IF NEEDED  . buPROPion (WELLBUTRIN XL) 300 MG 24 hr tablet Take one tablet daily  . citalopram (CELEXA) 40 MG tablet Take 1 tablet Daily for Mood  . empagliflozin (JARDIANCE) 25 MG TABS tablet Take 25 mg by mouth daily.  Marland Kitchen ezetimibe (ZETIA) 10 MG tablet Take 1 tablet Daily for Cholesterol  . glimepiride (AMARYL) 4 MG tablet Take 1/2 to 1 tablet Daily with Breakfast for Diabetes  . glucose blood test strip Use as instructed  . hydrochlorothiazide (HYDRODIURIL) 12.5 MG tablet TAKE 1 TABLET(12.5 MG) BY MOUTH DAILY  . hydrochlorothiazide (HYDRODIURIL) 25 MG tablet Take 1 tablet (25 mg total) by mouth daily.  Marland Kitchen losartan (COZAAR) 25 MG tablet  Take 1 tab (25 mg) daily to protect kidneys.  . metFORMIN (GLUCOPHAGE-XR) 500 MG 24 hr tablet TAKE 2 TABLETS(1000 MG) BY MOUTH TWICE DAILY WITH A MEAL  . phentermine (ADIPEX-P) 37.5 MG tablet Take 1/2 to 1 tablet every morning for dieting & weightloss (Patient not taking: Reported on 07/31/2018)  . rosuvastatin (CRESTOR) 5 MG tablet Start taking 1 tab once weekly in the evening for cholesterol; increase slowly as discussed/tolerated to every other day.  . sertraline (ZOLOFT) 100 MG tablet TAKE 1/2 A TABLET BY MOUTH EVERY DAY X 2 WEEKS, THE INCREASE TO 1 TABLET BY MOUTH EVERY DAY  . terbinafine (LAMISIL) 250 MG tablet Take one daily for 1 month, then stop for 1 month; repeat for 3 cycles.  Marland Kitchen tiZANidine (ZANAFLEX) 4 MG capsule TAKE 1 CAPSULE(4 MG) BY MOUTH THREE TIMES DAILY AS NEEDED FOR MUSCLE SPASMS  . topiramate (TOPAMAX) 25 MG tablet TAKE 1 TABLET(25 MG) BY MOUTH AT BEDTIME FOR WEIGHT LOSS   No current facility-administered medications on file prior to visit.     ROS: Review of Systems  Constitutional: Negative for malaise/fatigue and weight loss.  HENT: Negative for hearing loss and tinnitus.   Eyes: Negative for blurred vision and double vision.  Respiratory: Negative for cough, shortness of breath and wheezing.   Cardiovascular: Negative for chest pain, palpitations, orthopnea, claudication and leg swelling.  Gastrointestinal: Negative for abdominal pain, blood in stool, constipation, diarrhea, heartburn, melena, nausea and vomiting.  Genitourinary: Negative.   Musculoskeletal: Negative for joint pain (shoulder) and myalgias.  Skin: Negative for rash.  Neurological: Negative for dizziness, tingling, sensory change, weakness and headaches.  Endo/Heme/Allergies: Negative for polydipsia.  Psychiatric/Behavioral: Positive for depression. Negative for substance abuse.  All other systems reviewed and are negative.    Physical Exam:  There were no vitals taken for this visit.  General  Appearance: Well nourished, in no apparent distress. Eyes: PERRLA, EOMs, conjunctiva no swelling or erythema Sinuses: No Frontal/maxillary tenderness ENT/Mouth: Ext aud canals clear, TMs without erythema, bulging. No erythema, swelling, or exudate on post pharynx.  Tonsils not swollen or erythematous. Hearing normal.  Neck: Supple, thyroid normal.  Respiratory: Respiratory effort normal, BS equal bilaterally without rales, rhonchi, wheezing or stridor.  Cardio: RRR with no MRGs. Brisk peripheral pulses without edema.  Abdomen: Soft, + BS.  Non tender, no guarding, rebound, hernias, masses. Lymphatics: Non tender without lymphadenopathy.  Musculoskeletal:  symmetrical strength, normal gait. Left shoulder with limited adduction and posterior extension with lateral/deltoid pain; no palpable abnormality, crepitus, effusion, popping; can drape arm over head *** Skin: Warm, dry without rashes, lesions, ecchymosis.  Neuro: Cranial nerves intact. Normal muscle tone, no cerebellar symptoms. Sensation intact.  Psych: Awake and oriented X 3, normal affect, Insight and Judgment appropriate.     Dan MakerAshley C Yu Peggs, NP 1:18 PM [facility]

## 2018-09-10 ENCOUNTER — Ambulatory Visit: Payer: Self-pay | Admitting: Adult Health

## 2018-09-10 DIAGNOSIS — I10 Essential (primary) hypertension: Secondary | ICD-10-CM

## 2018-09-28 ENCOUNTER — Other Ambulatory Visit: Payer: Self-pay | Admitting: Adult Health

## 2018-09-28 DIAGNOSIS — E1165 Type 2 diabetes mellitus with hyperglycemia: Secondary | ICD-10-CM

## 2018-10-03 ENCOUNTER — Ambulatory Visit (INDEPENDENT_AMBULATORY_CARE_PROVIDER_SITE_OTHER): Payer: BC Managed Care – PPO | Admitting: Specialist

## 2018-10-03 ENCOUNTER — Encounter: Payer: Self-pay | Admitting: Specialist

## 2018-10-03 ENCOUNTER — Ambulatory Visit: Payer: Self-pay

## 2018-10-03 ENCOUNTER — Other Ambulatory Visit: Payer: Self-pay

## 2018-10-03 VITALS — BP 113/61 | HR 78 | Ht 67.5 in | Wt 230.0 lb

## 2018-10-03 DIAGNOSIS — M778 Other enthesopathies, not elsewhere classified: Secondary | ICD-10-CM

## 2018-10-03 DIAGNOSIS — M7542 Impingement syndrome of left shoulder: Secondary | ICD-10-CM | POA: Diagnosis not present

## 2018-10-03 DIAGNOSIS — M7502 Adhesive capsulitis of left shoulder: Secondary | ICD-10-CM | POA: Diagnosis not present

## 2018-10-03 DIAGNOSIS — M7582 Other shoulder lesions, left shoulder: Secondary | ICD-10-CM

## 2018-10-03 DIAGNOSIS — G8929 Other chronic pain: Secondary | ICD-10-CM

## 2018-10-03 DIAGNOSIS — M25512 Pain in left shoulder: Secondary | ICD-10-CM | POA: Diagnosis not present

## 2018-10-03 MED ORDER — NAPROXEN 500 MG PO TBEC: 500.0000 mg | DELAYED_RELEASE_TABLET | Freq: Two times a day (BID) | ORAL | 1 refills | Status: DC

## 2018-10-03 NOTE — Progress Notes (Signed)
Office Visit Note   Patient: Danielle Mccall           Date of Birth: 04/21/1965           MRN: [id number] Visit Date: 10/03/2018              Requested by: Judd Gaudierorbett, Ashley, NP [address] [place],  [place] [id number] PCP: Lucky CowboyMcKeown, William, MD   Assessment & Plan: Visit Diagnoses:  1. Chronic left shoulder pain   2. Impingement syndrome of left shoulder   3. Tendonitis of shoulder, left   4. Adhesive capsulitis of left shoulder     Plan: Avoid overhead lifting and overhead use of the arms. Do not lift greater than 10 lbs. Tylenol ES one every 6-8 hours for pain and inflamation. Call if you are having left shoulder pain and PT doesn't need to consider a cortisone injection into the right knee. Continue with PT for  2-3 weeks, then a home exercise program.  Follow-Up Instructions: No follow-ups on file.   Orders:  Orders Placed This Encounter  Procedures  . XR Shoulder Left   No orders of the defined types were placed in this encounter.     Procedures: No procedures performed   Clinical Data: No additional findings.   Subjective: Chief Complaint  Patient presents with  . Left Shoulder - Pain    53 year old right handed female with a 5 months history of left shoulder pain. She has had pain due to anticholesterol meds in the past. She has decreased ROM of the left shoulder moving it behind her back. Reaching up the back with the left arm is painful. There is increased pain with reaching for her phone on the night stand with pulling the covers of the bed but also with reaching across the chest. There is no neck pain or numbness or tingle. No bowel or bladder difficulty, no gait disturbance.    Review of Systems  Constitutional: Negative.  Negative for activity change, appetite change, chills, diaphoresis, fatigue, fever and unexpected weight change.  HENT: Positive for dental problem (extractions and fillings.). Negative for congestion, drooling,  ear discharge, ear pain, facial swelling, hearing loss, mouth sores, nosebleeds, postnasal drip, rhinorrhea, sinus pressure, sinus pain, sneezing, sore throat, tinnitus and trouble swallowing.   Eyes: Positive for visual disturbance. Negative for photophobia, pain, discharge, redness and itching.  Respiratory: Negative.  Negative for apnea, cough, choking, chest tightness, shortness of breath, wheezing and stridor.   Cardiovascular: Negative.  Negative for chest pain, palpitations and leg swelling.  Gastrointestinal: Negative.  Negative for abdominal distention, abdominal pain, anal bleeding, blood in stool, constipation, diarrhea, nausea, rectal pain and vomiting.  Endocrine: Negative for cold intolerance, heat intolerance, polydipsia, polyphagia and polyuria.  Genitourinary: Negative for difficulty urinating, dyspareunia, dysuria, enuresis, flank pain, frequency, genital sores, hematuria, menstrual problem, pelvic pain and urgency.  Musculoskeletal: Positive for back pain. Negative for arthralgias, gait problem, joint swelling, myalgias, neck pain and neck stiffness.  Skin: Negative.  Negative for color change, pallor, rash and wound.  Allergic/Immunologic: Negative for environmental allergies, food allergies and immunocompromised state.  Neurological: Positive for weakness. Negative for dizziness, tremors, seizures, syncope, facial asymmetry, speech difficulty, light-headedness, numbness and headaches.  Hematological: Negative for adenopathy. Does not bruise/bleed easily.  Psychiatric/Behavioral: Negative for agitation, behavioral problems, confusion, decreased concentration, dysphoric mood, hallucinations, self-injury, sleep disturbance and suicidal ideas. The patient is not nervous/anxious and is not hyperactive.      Objective: Vital Signs:  BP 113/61   Pulse 78   Ht 5' 7.5" (1.715 m)   Wt 230 lb (104.3 kg)   BMI 35.49 kg/m   Physical Exam Constitutional:      Appearance: She is  well-developed.  HENT:     Head: Normocephalic and atraumatic.  Eyes:     Pupils: Pupils are equal, round, and reactive to light.  Neck:     Musculoskeletal: Normal range of motion and neck supple.  Pulmonary:     Effort: Pulmonary effort is normal.     Breath sounds: Normal breath sounds.  Abdominal:     General: Bowel sounds are normal.     Palpations: Abdomen is soft.  Skin:    General: Skin is warm and dry.  Neurological:     Mental Status: She is alert and oriented to person, place, and time.  Psychiatric:        Behavior: Behavior normal.        Thought Content: Thought content normal.        Judgment: Judgment normal.     Left Shoulder Exam   Tenderness  The patient is experiencing tenderness in the acromion.  Range of Motion  Active abduction: abnormal  Passive abduction: abnormal  Extension: abnormal  External rotation:  60 abnormal  Forward flexion:  50 abnormal  Internal rotation 0 degrees: normal  Internal rotation 90 degrees: normal       Specialty Comments:  No specialty comments available.  Imaging: No results found.   PMFS History: Patient Active Problem List   Diagnosis Date Noted  . Vitamin D deficiency 05/22/2018  . Statin intolerance 03/28/2018  . Onychomycosis 02/13/2018  . Chronic venous insufficiency 02/13/2018  . Former smoker 02/12/2018  . Hypertension 06/23/2017  . Hyperlipidemia associated with type 2 diabetes mellitus ([place]) 06/23/2017  . Morbid obesity ([place]) 06/23/2017  . Major depression in partial remission ([place]) 06/23/2017  . Type 2 diabetes mellitus with hyperglycemia, without long-term current use of insulin ([place]) 10/26/2016   Past Medical History:  Diagnosis Date  . Diabetes mellitus without complication ([place])   . Leg pain, left   . Statin intolerance 03/28/2018    Family History  Adopted: Yes  Problem Relation Age of Onset  . Renal Disease Son     Past Surgical History:  Procedure Laterality Date  . CESAREAN  SECTION  2000  . CHOLECYSTECTOMY  2005   Social History   Occupational History  . Not on file  Tobacco Use  . Smoking status: Former Smoker    Packs/day: 1.00    Years: 32.00    Pack years: 32.00    Types: Cigarettes    Quit date: 04/23/2017    Years since quitting: 1.4  . Smokeless tobacco: Never Used  Substance and Sexual Activity  . Alcohol use: Yes    Comment: on rare occasssions  . Drug use: No  . Sexual activity: Yes    Birth control/protection: None

## 2018-10-03 NOTE — Patient Instructions (Signed)
Avoid overhead lifting and overhead use of the arms. Do not lift greater than 10 lbs. Tylenol ES one every 6-8 hours for pain and inflamation. Call if you are having left shoulder pain and PT doesn't need to consider a cortisone injection into the right knee. Continue with PT for  2-3 weeks, then a home exercise program.

## 2018-10-06 NOTE — Progress Notes (Deleted)
Assessment and Plan:  Martena was seen today for follow-up.  Diagnoses and all orders for this visit:  Essential hypertension Continue medications Monitor blood pressure at home; call if consistently over 130/80 Continue DASH diet.   Reminder to go to the ER if any CP, SOB, nausea, dizziness, severe HA, changes vision/speech, left arm numbness and tingling and jaw pain.  Type 2 diabetes mellitus with hyperglycemia, without long-term current use of insulin (HCC) Continue metformin, amaryl, jardiance at max doses respectively  Reported fasting glucose values much imporved; <130 goal, currently reporting *** Education: Reviewed 'ABCs' of diabetes management (respective goals in parentheses):  A1C (<7), blood pressure (<130/80), and cholesterol (LDL <70) Eye Exam yearly and Dental Exam every 6 months. Dietary recommendations Physical Activity recommendations  TO ADD ON WALKING, START LOW AT 20 MINS 2 DAYS A WEEK  Hyperlipidemia associated with type 2 diabetes mellitus (HCC) Tolerating rosuvastatin 5 mg daily; stopped zetia 10 mg *** Anticipate significant improvement with this switch Check lipids  Morbid obesity Improved with topamax 25 mg daily; phentermine *** Long discussion about weight loss, diet, and exercise Recommended diet heavy in fruits and veggies and low in animal meats, cheeses, and dairy products, appropriate calorie intake Suggested mediterranean diet which the patient enjoys Follow up at next visit  Major depressive disorder in partial remission, unspecified whether recurrent (HCC) Continue current medications: wellbutrin, zoloft *** Lifestyle discussed: diet/exerise, sleep hygiene, stress management, hydration  Shoulder stiffness, left ***   Further disposition pending results of labs. Discussed med's effects and SE's.   Over 30 minutes of exam, counseling, chart review, and critical decision making was performed.   Future Appointments  Date Time Provider  Department Center  10/07/2018  2:30 PM Judd Gaudier, NP [facility] None  11/03/2018  2:30 PM Kerrin Champagne, MD [facility] None  03/02/2019  3:00 PM Judd Gaudier, NP [facility] None    ------------------------------------------------------------------------------------------------------------------   HPI 53 y.o.female presents for 3 month follow up on hypertension, cholesterol, diabetes, weight and vitamin D deficiency.   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 Otelia Sergeant who felt impingement syndrome with tendonitis and adhesive capsulitis, injection ***   she has a diagnosis of depression/anxiety and is currently on wellbutrin 300 mg daily, added buspar 10 mg BID but felt terrible, didn't like lexapro, d/c'd and restarted zoloft which she reports worked well previously ***  she is prescribed phentermine and topamax for weight loss, hasn't started the phentermine. *** While on the medication they have lost 8 lbs since last visit. They deny palpitations, anxiety, trouble sleeping, elevated BP.   BMI is There is no height or weight on file to calculate BMI., she is working on diet and exercise. *** suggested mediterranean diet *** Wt Readings from Last 3 Encounters:  10/03/18 230 lb (104.3 kg)  07/31/18 238 lb (108 kg)  05/26/18 246 lb (111.6 kg)   Typical breakfast: skipping, 1 cup of coffee, splenda and powdered (4-5 heaping spoonfuls) Typical lunch: has whatever she cooked at school, commonly has pizza, chicken nuggets, corn dogs, hamburgers Typical dinner: varies  Exercise: started walking, then stopped with curfew and hasn't restarted Water intake:   Her blood pressure has been controlled at home, today their BP is    She does not workout. She denies chest pain, shortness of breath, dizziness.  *** oph exam    She  has been working on diet and exercise for  T2DM (on metformin 2000 mg daily, amaryl 8 mg daily, jardiance 25 mg daily), and denies hypoglycemia , nausea, paresthesia of the feet, polydipsia, polyuria and visual disturbances. Does check fasting, ranges 100-140. Last A1C in the office was:  Lab Results  Component Value Date   HGBA1C 9.9 (H) 05/26/2018    She is on cholesterol medication (zetia 10 mg daily, newly trialing rosuvastatin 5 mg once ***) and denies myalgias. Her cholesterol is not at goal. The cholesterol last visit was:   Lab Results  Component Value Date   CHOL 205 (H) 05/26/2018   HDL 74 05/26/2018   LDLCALC 104 (H) 05/26/2018   TRIG 157 (H) 05/26/2018   CHOLHDL 2.8 05/26/2018   Lab Results  Component Value Date   GFRNONAA 107 05/26/2018   Patient is on Vitamin D supplement.  *** Lab Results  Component Value Date   VD25OH 30 05/26/2018        Past Medical History:  Diagnosis Date  . Diabetes mellitus without complication (HCC)   . Leg pain, left   . 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 Atorvastatin     Myalgias  . Buspirone     "felt like I was going to pass out"     Current Outpatient Medications on File Prior to Visit  Medication Sig  . aspirin EC 81 MG tablet Take 81 mg by mouth daily.  Marland Kitchen. buPROPion (WELLBUTRIN XL) 150 MG 24 hr tablet START TAKE 1 TABLET BY MOUTH DAILY IN THE MORNING FOR MOOD;CAN INCREASE TO 2 TABLET AFTER 30 DAYS IF NEEDED (Patient not taking: Reported on 10/03/2018)  . buPROPion (WELLBUTRIN XL) 300 MG 24 hr tablet Take one tablet daily  . citalopram (CELEXA) 40 MG tablet Take 1 tablet Daily for Mood (Patient not taking: Reported on 10/03/2018)  . empagliflozin (JARDIANCE) 25 MG TABS tablet Take 25 mg by mouth daily.  Marland Kitchen. ezetimibe (ZETIA) 10 MG tablet Take 1 tablet Daily for Cholesterol (Patient not taking: Reported on 10/03/2018)  . glimepiride (AMARYL) 4 MG tablet Take 1/2 to 1 tablet Daily with Breakfast for Diabetes  . glucose blood test strip Use  as instructed  . hydrochlorothiazide (HYDRODIURIL) 12.5 MG tablet TAKE 1 TABLET(12.5 MG) BY MOUTH DAILY (Patient not taking: Reported on 10/03/2018)  . hydrochlorothiazide (HYDRODIURIL) 25 MG tablet Take 1 tablet (25 mg total) by mouth daily.  Marland Kitchen. losartan (COZAAR) 25 MG tablet Take 1 tab (25 mg) daily to protect kidneys. (Patient not taking: Reported on 10/03/2018)  . losartan (COZAAR) 50 MG tablet TAKE 1 TABLET(50 MG) BY MOUTH DAILY (Patient not taking: Reported on 10/03/2018)  . metFORMIN (GLUCOPHAGE-XR) 500 MG 24 hr tablet TAKE 2 TABLETS(1000 MG) BY MOUTH TWICE DAILY WITH A MEAL  . naproxen (EC-NAPROSYN) 500 MG EC tablet Take 1 tablet (500 mg total) by mouth 2 (two) times daily with a meal.  . phentermine (ADIPEX-P) 37.5 MG tablet Take 1/2 to 1 tablet every morning for dieting & weightloss (Patient not taking: Reported on 07/31/2018)  . rosuvastatin (CRESTOR) 5 MG tablet Start taking 1 tab once weekly in the evening for cholesterol; increase slowly as discussed/tolerated to every other day.  . sertraline (ZOLOFT) 100 MG tablet TAKE 1/2 A TABLET BY MOUTH EVERY DAY X 2 WEEKS, THE INCREASE TO 1 TABLET BY MOUTH EVERY DAY  . terbinafine (LAMISIL) 250 MG tablet Take one daily for 1 month, then stop for 1  month; repeat for 3 cycles.  Marland Kitchen tiZANidine (ZANAFLEX) 4 MG capsule TAKE 1 CAPSULE(4 MG) BY MOUTH THREE TIMES DAILY AS NEEDED FOR MUSCLE SPASMS  . topiramate (TOPAMAX) 25 MG tablet TAKE 1 TABLET(25 MG) BY MOUTH AT BEDTIME FOR WEIGHT LOSS (Patient not taking: Reported on 10/03/2018)   No current facility-administered medications on file prior to visit.     ROS: Review of Systems  Constitutional: Negative for malaise/fatigue and weight loss.  HENT: Negative for hearing loss and tinnitus.   Eyes: Negative for blurred vision and double vision.  Respiratory: Negative for cough, shortness of breath and wheezing.   Cardiovascular: Negative for chest pain, palpitations, orthopnea, claudication and leg swelling.   Gastrointestinal: Negative for abdominal pain, blood in stool, constipation, diarrhea, heartburn, melena, nausea and vomiting.  Genitourinary: Negative.   Musculoskeletal: Negative for joint pain (shoulder) and myalgias.  Skin: Negative for rash.  Neurological: Negative for dizziness, tingling, sensory change, weakness and headaches.  Endo/Heme/Allergies: Negative for polydipsia.  Psychiatric/Behavioral: Positive for depression. Negative for substance abuse.  All other systems reviewed and are negative.    Physical Exam:  There were no vitals taken for this visit.  General Appearance: Well nourished, in no apparent distress. Eyes: PERRLA, EOMs, conjunctiva no swelling or erythema Sinuses: No Frontal/maxillary tenderness ENT/Mouth: Ext aud canals clear, TMs without erythema, bulging. No erythema, swelling, or exudate on post pharynx.  Tonsils not swollen or erythematous. Hearing normal.  Neck: Supple, thyroid normal.  Respiratory: Respiratory effort normal, BS equal bilaterally without rales, rhonchi, wheezing or stridor.  Cardio: RRR with no MRGs. Brisk peripheral pulses without edema.  Abdomen: Soft, + BS.  Non tender, no guarding, rebound, hernias, masses. Lymphatics: Non tender without lymphadenopathy.  Musculoskeletal:  symmetrical strength, normal gait. Left shoulder with limited adduction and posterior extension with lateral/deltoid pain; no palpable abnormality, crepitus, effusion, popping; can drape arm over head *** Skin: Warm, dry without rashes, lesions, ecchymosis.  Neuro: Cranial nerves intact. Normal muscle tone, no cerebellar symptoms. Sensation intact.  Psych: Awake and oriented X 3, normal affect, Insight and Judgment appropriate.     Izora Ribas, NP 1:36 PM [facility]

## 2018-10-07 ENCOUNTER — Ambulatory Visit: Payer: BC Managed Care – PPO | Admitting: Adult Health

## 2018-10-07 DIAGNOSIS — I10 Essential (primary) hypertension: Secondary | ICD-10-CM

## 2018-10-09 NOTE — Progress Notes (Signed)
Assessment and Plan:  Danielle Mccall was seen today for follow-up.  Diagnoses and all orders for this visit:  Essential hypertension Continue medications Monitor blood pressure at home; call if consistently over 130/80 Continue DASH diet.   Reminder to go to the ER if any CP, SOB, nausea, dizziness, severe HA, changes vision/speech, left arm numbness and tingling and jaw pain.  Type 2 diabetes mellitus with hyperglycemia, without long-term current use of insulin (HCC) Continue metformin, amaryl, jardiance at max doses respectively  Reported fasting glucose values much imporved; <130 goal, currently not checking glucose, will restart, don't take amaryl until eaten in the AM  Education: Reviewed 'ABCs' of diabetes management (respective goals in parentheses):  A1C (<7), blood pressure (<130/80), and cholesterol (LDL <70) Eye Exam yearly and Dental Exam every 6 months. Dietary recommendations Physical Activity recommendations  TO ADD ON WALKING, START LOW AT 20 MINS 2 DAYS A WEEK WIll follow up sooner in 4-6 weeks for diabetes education if A1C remains 9+   Hyperlipidemia associated with type 2 diabetes mellitus (HCC) Tolerating rosuvastatin 5 mg daily;  Anticipate significant improvement with this switch Check lipids  Morbid obesity Currently not taking, would like to keep and restart topamax 25 mg daily; phentermine  Long discussion about weight loss, diet, and exercise Recommended diet heavy in fruits and veggies and low in animal meats, cheeses, and dairy products, appropriate calorie intake Suggested mediterranean diet which the patient enjoys Follow up at next visit  Major depressive disorder in partial remission, unspecified whether recurrent ([place]) Continue current medications: wellbutrin, zoloft  Lifestyle discussed: diet/exerise, sleep hygiene, stress management, hydration  Shoulder stiffness, left Following with ortho, working with PT, doing antiinflammatories    Further  disposition pending results of labs. Discussed med's effects and SE's.   Over 30 minutes of exam, counseling, chart review, and critical decision making was performed.   Future Appointments  Date Time Provider [place]  11/03/2018  2:30 PM Jessy Oto, MD [facility] None  03/02/2019  3:00 PM Liane Comber, NP [facility] None    ------------------------------------------------------------------------------------------------------------------   HPI 53 y.o.female presents for 3 month follow up on hypertension, cholesterol, diabetes, weight and vitamin D deficiency.   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 Louanne Skye who felt impingement syndrome with tendonitis and adhesive capsulitis, will be doing PT, antiiflammatories, consider injection if not improving.    she has a diagnosis of depression/anxiety and is currently on wellbutrin 300 mg daily, added buspar 10 mg BID but felt terrible, didn't like lexapro, d/c'd and restarted zoloft which she reports worked well previously, feels good with current regimen.   she is prescribed phentermine and topamax for weight loss, hasn't been taking due to family situation, plans to start. While on the medication they have lost 0 lbs since last visit. They deny palpitations, anxiety, trouble sleeping, elevated BP.   BMI is Body mass index is 36.73 kg/m., she has not been able to work on diet and exercise due to husband's health.  Wt Readings from Last 3 Encounters:  10/13/18 238 lb (108 kg)  10/03/18 230 lb (104.3 kg)  07/31/18 238 lb (108 kg)   Typical breakfast: skipping, 1 cup of coffee, splenda and powdered (4-5 heaping spoonfuls) Typical lunch: has whatever  she cooked at school, commonly has pizza, chicken nuggets, corn  dogs, hamburgers Typical dinner: varies  Exercise: started walking, then stopped with curfew and hasn't restarted Water intake:   Her blood pressure has been controlled at home, today their BP is BP: 122/76  She does not workout. She denies chest pain, shortness of breath, dizziness.   She has been working on diet and exercise for T2DM (on metformin 2000 mg daily, amaryl 4 mg BID, jardiance 25 mg daily), and denies hypoglycemia , nausea, paresthesia of the feet, polydipsia, polyuria and visual disturbances. Admits meter has been out of battery, hasn't been checking, but has gotten fixed and can restart checking. Last A1C in the office was:  Lab Results  Component Value Date   HGBA1C 9.9 (H) 05/26/2018    She is on cholesterol medication (rosuvastatin 5 mg daily) and denies myalgias. Her cholesterol is not at goal. The cholesterol last visit was:   Lab Results  Component Value Date   CHOL 205 (H) 05/26/2018   HDL 74 05/26/2018   LDLCALC 104 (H) 05/26/2018   TRIG 157 (H) 05/26/2018   CHOLHDL 2.8 05/26/2018   Lab Results  Component Value Date   GFRNONAA 107 05/26/2018   Patient is unsure whether she is on a vitamin D supplement Lab Results  Component Value Date   VD25OH 30 05/26/2018        Past Medical History:  Diagnosis Date  . Diabetes mellitus without complication (HCC)   . Leg pain, left   . 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 Atorvastatin     Myalgias  . Buspirone     "felt like I was going to pass out"     Current Outpatient Medications on File Prior to Visit  Medication Sig  . aspirin EC 81 MG tablet Take 81 mg by mouth daily.  Marland Kitchen buPROPion (WELLBUTRIN XL) 300 MG 24 hr tablet Take one tablet daily  . empagliflozin (JARDIANCE) 25 MG TABS tablet Take 25 mg by mouth daily.  Marland Kitchen glucose blood test strip Use as instructed  . hydrochlorothiazide (HYDRODIURIL) 25 MG tablet Take 1 tablet (25 mg total) by mouth daily.  Marland Kitchen losartan  (COZAAR) 25 MG tablet Take 1 tab (25 mg) daily to protect kidneys.  . metFORMIN (GLUCOPHAGE-XR) 500 MG 24 hr tablet TAKE 2 TABLETS(1000 MG) BY MOUTH TWICE DAILY WITH A MEAL  . terbinafine (LAMISIL) 250 MG tablet Take one daily for 1 month, then stop for 1 month; repeat for 3 cycles.  Marland Kitchen tiZANidine (ZANAFLEX) 4 MG capsule TAKE 1 CAPSULE(4 MG) BY MOUTH THREE TIMES DAILY AS NEEDED FOR MUSCLE SPASMS  . ezetimibe (ZETIA) 10 MG tablet Take 1 tablet Daily for Cholesterol (Patient not taking: Reported on 10/03/2018)  . naproxen (EC-NAPROSYN) 500 MG EC tablet Take 1 tablet (500 mg total) by mouth 2 (two) times daily with a meal. (Patient not taking: Reported on 10/13/2018)  . phentermine (ADIPEX-P) 37.5 MG tablet Take 1/2 to 1 tablet every morning for dieting & weightloss (Patient not taking: Reported on 07/31/2018)  . topiramate (TOPAMAX) 25 MG tablet TAKE 1 TABLET(25 MG) BY MOUTH AT BEDTIME FOR WEIGHT LOSS (Patient not taking: Reported on 10/03/2018)   No current facility-administered medications on file prior to visit.     ROS: Review of Systems  Constitutional: Negative for malaise/fatigue and weight loss.  HENT: Negative for hearing loss and tinnitus.   Eyes: Negative for blurred vision and double vision.  Respiratory:  Negative for cough, shortness of breath and wheezing.   Cardiovascular: Negative for chest pain, palpitations, orthopnea, claudication and leg swelling.  Gastrointestinal: Negative for abdominal pain, blood in stool, constipation, diarrhea, heartburn, melena, nausea and vomiting.  Genitourinary: Negative.   Musculoskeletal: Negative for joint pain (shoulder) and myalgias.  Skin: Negative for rash.  Neurological: Negative for dizziness, tingling, sensory change, weakness and headaches.  Endo/Heme/Allergies: Negative for polydipsia.  Psychiatric/Behavioral: Positive for depression. Negative for substance abuse.  All other systems reviewed and are negative.    Physical Exam:  BP  122/76   Pulse 87   Temp (!) 97.5 F (36.4 C)   Ht 5' 7.5" (1.715 m)   Wt 238 lb (108 kg)   SpO2 97%   BMI 36.73 kg/m   General Appearance: Well nourished, in no apparent distress. Eyes: PERRLA, EOMs, conjunctiva no swelling or erythema Sinuses: No Frontal/maxillary tenderness ENT/Mouth: Ext aud canals clear, TMs without erythema, bulging. No erythema, swelling, or exudate on post pharynx.  Tonsils not swollen or erythematous. Hearing normal.  Neck: Supple, thyroid normal.  Respiratory: Respiratory effort normal, BS equal bilaterally without rales, rhonchi, wheezing or stridor.  Cardio: RRR with no MRGs. Brisk peripheral pulses without edema.  Abdomen: Soft, + BS.  Non tender, no guarding, rebound, hernias, masses. Lymphatics: Non tender without lymphadenopathy.  Musculoskeletal:  symmetrical strength, normal gait.  Skin: Warm, dry without rashes, lesions, ecchymosis.  Neuro: Cranial nerves intact. Normal muscle tone, no cerebellar symptoms. Sensation intact.  Psych: Awake and oriented X 3, normal affect, Insight and Judgment appropriate.     Dan MakerAshley C Zakyia Gagan, NP 2:47 PM [facility]

## 2018-10-13 ENCOUNTER — Ambulatory Visit (INDEPENDENT_AMBULATORY_CARE_PROVIDER_SITE_OTHER): Payer: BC Managed Care – PPO | Admitting: Adult Health

## 2018-10-13 ENCOUNTER — Encounter: Payer: Self-pay | Admitting: Adult Health

## 2018-10-13 ENCOUNTER — Other Ambulatory Visit: Payer: Self-pay

## 2018-10-13 VITALS — BP 122/76 | HR 87 | Temp 97.5°F | Ht 67.5 in | Wt 238.0 lb

## 2018-10-13 DIAGNOSIS — F324 Major depressive disorder, single episode, in partial remission: Secondary | ICD-10-CM

## 2018-10-13 DIAGNOSIS — E559 Vitamin D deficiency, unspecified: Secondary | ICD-10-CM

## 2018-10-13 DIAGNOSIS — I10 Essential (primary) hypertension: Secondary | ICD-10-CM

## 2018-10-13 DIAGNOSIS — E785 Hyperlipidemia, unspecified: Secondary | ICD-10-CM

## 2018-10-13 DIAGNOSIS — E1165 Type 2 diabetes mellitus with hyperglycemia: Secondary | ICD-10-CM

## 2018-10-13 DIAGNOSIS — E1169 Type 2 diabetes mellitus with other specified complication: Secondary | ICD-10-CM | POA: Diagnosis not present

## 2018-10-13 DIAGNOSIS — Z79899 Other long term (current) drug therapy: Secondary | ICD-10-CM

## 2018-10-13 DIAGNOSIS — Z87891 Personal history of nicotine dependence: Secondary | ICD-10-CM

## 2018-10-13 MED ORDER — GLIMEPIRIDE 4 MG PO TABS: ORAL_TABLET | ORAL | 1 refills | Status: DC

## 2018-10-13 MED ORDER — ROSUVASTATIN CALCIUM 5 MG PO TABS: ORAL_TABLET | ORAL | 1 refills | Status: DC

## 2018-10-13 MED ORDER — SERTRALINE HCL 100 MG PO TABS: ORAL_TABLET | ORAL | 1 refills | Status: DC

## 2018-10-13 NOTE — Patient Instructions (Addendum)
Goals    . Blood Pressure < 130/80    . DIET - DECREASE SODA OR JUICE INTAKE    . Exercise 3x per week (30 min per time)    . HEMOGLOBIN A1C < 7.0    . Weight (lb) < 215 lb (97.5 kg)        Stop by pharmacy to review meds with them - pick up med sorter   Please schedule your diabetes eye exam   Please check if /what dose of vitamin D supplement you are on and message me    Please start checking your fasting sugars again - please do not take your glimepiride until you have checked and/or eaten  Diabetes Eating Plan Prediabetes is a condition that causes blood sugar (glucose) levels to be higher than normal. This increases the risk for developing diabetes. In order to prevent diabetes from developing, your health care provider may recommend a diet and other lifestyle changes to help you:  Control your blood glucose levels.  Improve your cholesterol levels.  Manage your blood pressure. Your health care provider may recommend working with a diet and nutrition specialist (dietitian) to make a meal plan that is best for you. What are tips for following this plan? Lifestyle  Set weight loss goals with the help of your health care team. It is recommended that most people with prediabetes lose 7% of their current body weight.  Exercise for at least 30 minutes at least 5 days a week.  Attend a support group or seek ongoing support from a mental health counselor.  Take over-the-counter and prescription medicines only as told by your health care provider. Reading food labels  Read food labels to check the amount of fat, salt (sodium), and sugar in prepackaged foods. Avoid foods that have: ? Saturated fats. ? Trans fats. ? Added sugars.  Avoid foods that have more than 300 milligrams (mg) of sodium per serving. Limit your daily sodium intake to less than 2,300 mg each day. Shopping  Avoid buying pre-made and processed foods. Cooking  Cook with olive oil. Do not use butter, lard,  or ghee.  Bake, broil, grill, or boil foods. Avoid frying. Meal planning   Work with your dietitian to develop an eating plan that is right for you. This may include: ? Tracking how many calories you take in. Use a food diary, notebook, or mobile application to track what you eat at each meal. ? Using the glycemic index (GI) to plan your meals. The index tells you how quickly a food will raise your blood glucose. Choose low-GI foods. These foods take a longer time to raise blood glucose.  Consider following a Mediterranean diet. This diet includes: ? Several servings each day of fresh fruits and vegetables. ? Eating fish at least twice a week. ? Several servings each day of whole grains, beans, nuts, and seeds. ? Using olive oil instead of other fats. ? Moderate alcohol consumption. ? Eating small amounts of red meat and whole-fat dairy.  If you have high blood pressure, you may need to limit your sodium intake or follow a diet such as the DASH eating plan. DASH is an eating plan that aims to lower high blood pressure. What foods are recommended? The items listed below may not be a complete list. Talk with your dietitian about what dietary choices are best for you. Grains Whole grains, such as whole-wheat or whole-grain breads, crackers, cereals, and pasta. Unsweetened oatmeal. Bulgur. Barley. Quinoa. Brown rice. Corn  or whole-wheat flour tortillas or taco shells. Vegetables Lettuce. Spinach. Peas. Beets. Cauliflower. Cabbage. Broccoli. Carrots. Tomatoes. Squash. Eggplant. Herbs. Peppers. Onions. Cucumbers. Brussels sprouts. Fruits Berries. Bananas. Apples. Oranges. Grapes. Papaya. Mango. Pomegranate. Kiwi. Grapefruit. Cherries. Meats and other protein foods Seafood. Poultry without skin. Lean cuts of pork and beef. Tofu. Eggs. Nuts. Beans. Dairy Low-fat or fat-free dairy products, such as yogurt, cottage cheese, and cheese. Beverages Water. Tea. Coffee. Sugar-free or diet soda.  Seltzer water. Lowfat or no-fat milk. Milk alternatives, such as soy or almond milk. Fats and oils Olive oil. Canola oil. Sunflower oil. Grapeseed oil. Avocado. Walnuts. Sweets and desserts Sugar-free or low-fat pudding. Sugar-free or low-fat ice cream and other frozen treats. Seasoning and other foods Herbs. Sodium-free spices. Mustard. Relish. Low-fat, low-sugar ketchup. Low-fat, low-sugar barbecue sauce. Low-fat or fat-free mayonnaise. What foods are not recommended? The items listed below may not be a complete list. Talk with your dietitian about what dietary choices are best for you. Grains Refined white flour and flour products, such as bread, pasta, snack foods, and cereals. Vegetables Canned vegetables. Frozen vegetables with butter or cream sauce. Fruits Fruits canned with syrup. Meats and other protein foods Fatty cuts of meat. Poultry with skin. Breaded or fried meat. Processed meats. Dairy Full-fat yogurt, cheese, or milk. Beverages Sweetened drinks, such as sweet iced tea and soda. Fats and oils Butter. Lard. Ghee. Sweets and desserts Baked goods, such as cake, cupcakes, pastries, cookies, and cheesecake. Seasoning and other foods Spice mixes with added salt. Ketchup. Barbecue sauce. Mayonnaise. Summary  To prevent diabetes from developing, you may need to make diet and other lifestyle changes to help control blood sugar, improve cholesterol levels, and manage your blood pressure.  Set weight loss goals with the help of your health care team. It is recommended that most people with prediabetes lose 7 percent of their current body weight.  Consider following a Mediterranean diet that includes plenty of fresh fruits and vegetables, whole grains, beans, nuts, seeds, fish, lean meat, low-fat dairy, and healthy oils. This information is not intended to replace advice given to you by your health care provider. Make sure you discuss any questions you have with your health care  provider. Document Released: 05/18/2014 Document Revised: 04/25/2018 Document Reviewed: 03/07/2016 Elsevier Patient Education  2020 [employer].

## 2018-10-14 LAB — COMPLETE METABOLIC PANEL WITH GFR
AG Ratio: 1.3 (calc) (ref 1.0–2.5)
ALT: 21 U/L (ref 6–29)
AST: 16 U/L (ref 10–35)
Albumin: 4.3 g/dL (ref 3.6–5.1)
Alkaline phosphatase (APISO): 72 U/L (ref 37–153)
BUN: 23 mg/dL (ref 7–25)
CO2: 27 mmol/L (ref 20–32)
Calcium: 10.4 mg/dL (ref 8.6–10.4)
Chloride: 99 mmol/L (ref 98–110)
Creat: 1.02 mg/dL (ref 0.50–1.05)
GFR, Est African American: 74 mL/min/{1.73_m2} (ref >=60)
GFR, Est Non African American: 64 mL/min/{1.73_m2} (ref >=60)
Globulin: 3.2 g/dL (calc) (ref 1.9–3.7)
Glucose, Bld: 233 mg/dL — ABNORMAL HIGH (ref 65–99)
Potassium: 4.6 mmol/L (ref 3.5–5.3)
Sodium: 137 mmol/L (ref 135–146)
Total Bilirubin: 0.3 mg/dL (ref 0.2–1.2)
Total Protein: 7.5 g/dL (ref 6.1–8.1)

## 2018-10-14 LAB — LIPID PANEL
Cholesterol: 241 mg/dL — ABNORMAL HIGH (ref <200)
HDL: 67 mg/dL (ref >=50)
LDL Cholesterol (Calc): 143 mg/dL (calc) — ABNORMAL HIGH
Non-HDL Cholesterol (Calc): 174 mg/dL (calc) — ABNORMAL HIGH (ref <130)
Total CHOL/HDL Ratio: 3.6 (calc) (ref <5.0)
Triglycerides: 177 mg/dL — ABNORMAL HIGH (ref <150)

## 2018-10-14 LAB — CBC WITH DIFFERENTIAL/PLATELET
Absolute Monocytes: 726 cells/uL (ref 200–950)
Basophils Absolute: 110 cells/uL (ref 0–200)
Basophils Relative: 1 %
Eosinophils Absolute: 198 cells/uL (ref 15–500)
Eosinophils Relative: 1.8 %
HCT: 41 % (ref 35.0–45.0)
Hemoglobin: 13.3 g/dL (ref 11.7–15.5)
Lymphs Abs: 2992 cells/uL (ref 850–3900)
MCH: 28.5 pg (ref 27.0–33.0)
MCHC: 32.4 g/dL (ref 32.0–36.0)
MCV: 88 fL (ref 80.0–100.0)
MPV: 9.9 fL (ref 7.5–12.5)
Monocytes Relative: 6.6 %
Neutro Abs: 6974 cells/uL (ref 1500–7800)
Neutrophils Relative %: 63.4 %
Platelets: 468 10*3/uL — ABNORMAL HIGH (ref 140–400)
RBC: 4.66 10*6/uL (ref 3.80–5.10)
RDW: 12.1 % (ref 11.0–15.0)
Total Lymphocyte: 27.2 %
WBC: 11 10*3/uL — ABNORMAL HIGH (ref 3.8–10.8)

## 2018-10-14 LAB — HEMOGLOBIN A1C
Hgb A1c MFr Bld: 10.6 % of total Hgb — ABNORMAL HIGH (ref <5.7)
Mean Plasma Glucose: 258 (calc)
eAG (mmol/L): 14.3 (calc)

## 2018-10-14 LAB — TSH: TSH: 3.04 mIU/L

## 2018-10-14 LAB — MAGNESIUM: Magnesium: 2 mg/dL (ref 1.5–2.5)

## 2018-10-30 ENCOUNTER — Other Ambulatory Visit: Payer: Self-pay | Admitting: Adult Health

## 2018-10-30 DIAGNOSIS — E1165 Type 2 diabetes mellitus with hyperglycemia: Secondary | ICD-10-CM

## 2018-11-03 ENCOUNTER — Ambulatory Visit: Payer: BC Managed Care – PPO | Admitting: Specialist

## 2018-11-04 ENCOUNTER — Ambulatory Visit: Payer: BC Managed Care – PPO | Admitting: Specialist

## 2018-11-13 DIAGNOSIS — E1122 Type 2 diabetes mellitus with diabetic chronic kidney disease: Secondary | ICD-10-CM | POA: Insufficient documentation

## 2018-11-13 DIAGNOSIS — N182 Chronic kidney disease, stage 2 (mild): Secondary | ICD-10-CM | POA: Insufficient documentation

## 2018-11-13 NOTE — Progress Notes (Signed)
Diabetes Education and Follow-Up Visit  53 y.o.female presents for diabetic education. She has Diabetes Mellitus type 2:  with diabetic chronic kidney disease, she is on bASA, and denies foot ulcerations, hypoglycemia , nausea, polydipsia, polyuria and weight loss. She does endorse blurry vision and burning sensation in feet.   Last hemoglobin A1c was: Lab Results  Component Value Date   HGBA1C 10.6 (H) 10/13/2018   HGBA1C 9.9 (H) 05/26/2018   HGBA1C 14.0 (H) 02/13/2018   she is prescribed phentermine and topamax for weight loss but hasn't been taking/hasn't picked up from pharmacy. While on the medication they have lost 5 lbs since last visit.   BMI is Body mass index is 36.08 kg/m., she is working on diet and exercise. She following low carb diet with her husband. Has cut out soda.  Wt Readings from Last 3 Encounters:  11/17/18 233 lb 12.8 oz (106.1 kg)  10/13/18 238 lb (108 kg)  10/03/18 230 lb (104.3 kg)   Typical breakfast: often skips, may have omlette, cottage cheese with eggs,  Typical lunch: zero carb bread with lunch meat, tuna fish, salad, cuties/fruit Typical dinner: sweet potato if she has carb, typically protein with vegetable, or soup Exercise: minimal, plans to start walking once husband's health improves Water intake: Improved, using refillable bottle with flavors, rare diet soda  Pt is on a regimen of: metformin, glimepiride 4 mg BID, jardiance 25 mg   Pt checks her sugars 2 x day, fasting and typically will check before bedtime Ranged 74-319 - not distinguishing between fasting and bedtime checks Lowest sugar was 74.  She has hypoglycemia awareness.  Highest sugar was 319 Glucometer: trumetrics   Ophthalmology exam: needs to schedule  Patient does have CKD She is on ACE/ARB   Lab Results  Component Value Date   GFRNONAA 64 10/13/2018    Lab Results  Component Value Date   CREATININE 1.02 10/13/2018   BUN 23 10/13/2018   NA 137 10/13/2018   K 4.6  10/13/2018   CL 99 10/13/2018   CO2 27 10/13/2018    Lab Results  Component Value Date   MICROALBUR 3.0 02/13/2018     She is on a Statin. She is tolerating rosuvastatin 5 mg daily despite reported hx of statin intolerance  She is not at goal of less than 70.  Lab Results  Component Value Date   CHOL 241 (H) 10/13/2018   HDL 67 10/13/2018   LDLCALC 143 (H) 10/13/2018   TRIG 177 (H) 10/13/2018   CHOLHDL 3.6 10/13/2018     Problem List has Type 2 diabetes mellitus with hyperglycemia, without long-term current use of insulin (HCC); Hypertension; Hyperlipidemia associated with type 2 diabetes mellitus (HCC); Morbid obesity (HCC); Major depression in partial remission (HCC); Former smoker; Onychomycosis; Chronic venous insufficiency; Statin intolerance; Vitamin D deficiency; CKD stage 2 due to type 2 diabetes mellitus (HCC); and Diabetic peripheral neuropathy associated with type 2 diabetes mellitus (HCC) on their problem list.  Medications Current Outpatient Medications on File Prior to Visit  Medication Sig  . aspirin EC 81 MG tablet Take 81 mg by mouth daily.  Marland Kitchen buPROPion (WELLBUTRIN XL) 300 MG 24 hr tablet Take one tablet daily  . empagliflozin (JARDIANCE) 25 MG TABS tablet Take 25 mg by mouth daily.  Marland Kitchen glimepiride (AMARYL) 4 MG tablet Take 1-2 tablet Daily with Breakfast for Diabetes  . glucose blood test strip Use as instructed  . hydrochlorothiazide (HYDRODIURIL) 25 MG tablet Take 1 tablet (25 mg total)  by mouth daily.  . metFORMIN (GLUCOPHAGE-XR) 500 MG 24 hr tablet TAKE 2 TABLETS(1000 MG) BY MOUTH TWICE DAILY WITH A MEAL  . rosuvastatin (CRESTOR) 5 MG tablet Takes 1 tablet everyday  . sertraline (ZOLOFT) 100 MG tablet TAKE 1 TABLET BY MOUTH EVERY DAY  . terbinafine (LAMISIL) 250 MG tablet Take one daily for 1 month, then stop for 1 month; repeat for 3 cycles.  Marland Kitchen. tiZANidine (ZANAFLEX) 4 MG capsule TAKE 1 CAPSULE(4 MG) BY MOUTH THREE TIMES DAILY AS NEEDED FOR MUSCLE SPASMS  .  ezetimibe (ZETIA) 10 MG tablet Take 1 tablet Daily for Cholesterol (Patient not taking: Reported on 10/03/2018)  . losartan (COZAAR) 25 MG tablet Take 1 tab (25 mg) daily to protect kidneys.  Marland Kitchen. losartan (COZAAR) 50 MG tablet TAKE 1 TABLET(50 MG) BY MOUTH DAILY  . naproxen (EC-NAPROSYN) 500 MG EC tablet Take 1 tablet (500 mg total) by mouth 2 (two) times daily with a meal. (Patient not taking: Reported on 10/13/2018)  . phentermine (ADIPEX-P) 37.5 MG tablet Take 1/2 to 1 tablet every morning for dieting & weightloss (Patient not taking: Reported on 07/31/2018)  . topiramate (TOPAMAX) 25 MG tablet TAKE 1 TABLET(25 MG) BY MOUTH AT BEDTIME FOR WEIGHT LOSS (Patient not taking: Reported on 10/03/2018)   No current facility-administered medications on file prior to visit.     ROS- see HPI  Physical Exam: Blood pressure 124/68, pulse 81, temperature (!) 97.5 F (36.4 C), height 5' 7.5" (1.715 m), weight 233 lb 12.8 oz (106.1 kg), SpO2 97 %. Body mass index is 36.08 kg/m. General Appearance: Well nourished, in no apparent distress. Eyes: PERRLA, EOMs, conjunctiva no swelling or erythema ENT/Mouth: Ext aud canals clear, TMs without erythema, bulging. No erythema, swelling, or exudate on post pharynx.  Tonsils not swollen or erythematous. Hearing normal.  Respiratory: Respiratory effort normal, BS equal bilaterally without rales, rhonchi, wheezing or stridor.  Cardio: RRR with no MRGs. Brisk peripheral pulses without edema.  Abdomen: Soft, + BS.  Non tender, no guarding, rebound, hernias, masses. Musculoskeletal: Full ROM, 5/5 strength, normal gait.  Skin: Warm, dry without rashes, lesions, ecchymosis.  Neuro: Cranial nerves intact. Normal muscle tone, no cerebellar symptoms. Sensation intact.    Plan and Assessment: Diabetes Education: Reviewed 'ABCs' of diabetes management (respective goals in parentheses):  A1C (<7), blood pressure (<130/80), and cholesterol (LDL <70) Eye Exam yearly and Dental  Exam every 6 months. Dietary recommendations Physical Activity recommendations - Strongly advised her to start keeping a log of fasting glucose, goal to get <150, then consistently <130 -  - given sugar log and advised how to fill it and to bring it at next appt  - given foot care handout and explained the principles  - given instructions for hypoglycemia management  - increase rosuvastatin from 5 mg to 20 mg  - schedule diabetic eye and bring report to CPE -     Fructosamine  Hyperlipidemia associated with type 2 diabetes mellitus (HCC) Increase rosuvastatin from 5 mg to 20 mg daily   Morbid obesity (HCC) Obesity with co morbidities Long discussion about weight loss, diet, and exercise Discussed healthy weight Patient starting on phentermine/topamax  drug breaks; continue close follow up. Return in 2 months  Essential hypertension Continue medication Monitor blood pressure at home; call if consistently over 130/80 Continue DASH diet.   Reminder to go to the ER if any CP, SOB, nausea, dizziness, severe HA, changes vision/speech, left arm numbness and tingling and jaw pain.  CKD  stage 2 due to type 2 diabetes mellitus (HCC) Monitor; improve glucose; increase water intake   Diabetic peripheral neuropathy associated with type 2 diabetes mellitus ([place]) Declines medications at this time, discussed feet checks.   Other orders -     phentermine (ADIPEX-P) 37.5 MG tablet; Take 1/2 to 1 tablet every morning for dieting & weightloss -     topiramate (TOPAMAX) 25 MG tablet; TAKE 1 TABLET(25 MG) BY MOUTH AT BEDTIME FOR WEIGHT LOSS -     rosuvastatin (CRESTOR) 20 MG tablet; Takes 1 tablet everyday -     losartan (COZAAR) 50 MG tablet; TAKE 1 TABLET(50 MG) BY MOUTH DAILY   Future Appointments  Date Time Provider [place]  03/02/2019  3:00 PM Liane Comber, NP [facility] None

## 2018-11-17 ENCOUNTER — Ambulatory Visit: Payer: BC Managed Care – PPO | Admitting: Adult Health

## 2018-11-17 ENCOUNTER — Other Ambulatory Visit: Payer: Self-pay

## 2018-11-17 ENCOUNTER — Encounter: Payer: Self-pay | Admitting: Adult Health

## 2018-11-17 VITALS — BP 124/68 | HR 81 | Temp 97.5°F | Ht 67.5 in | Wt 233.8 lb

## 2018-11-17 DIAGNOSIS — E1142 Type 2 diabetes mellitus with diabetic polyneuropathy: Secondary | ICD-10-CM

## 2018-11-17 DIAGNOSIS — N182 Chronic kidney disease, stage 2 (mild): Secondary | ICD-10-CM

## 2018-11-17 DIAGNOSIS — E1169 Type 2 diabetes mellitus with other specified complication: Secondary | ICD-10-CM

## 2018-11-17 DIAGNOSIS — E1122 Type 2 diabetes mellitus with diabetic chronic kidney disease: Secondary | ICD-10-CM

## 2018-11-17 DIAGNOSIS — I10 Essential (primary) hypertension: Secondary | ICD-10-CM

## 2018-11-17 DIAGNOSIS — E1165 Type 2 diabetes mellitus with hyperglycemia: Secondary | ICD-10-CM

## 2018-11-17 DIAGNOSIS — E785 Hyperlipidemia, unspecified: Secondary | ICD-10-CM

## 2018-11-17 MED ORDER — TOPIRAMATE 25 MG PO TABS: ORAL_TABLET | ORAL | 1 refills | Status: DC

## 2018-11-17 MED ORDER — ROSUVASTATIN CALCIUM 20 MG PO TABS: ORAL_TABLET | ORAL | 1 refills | Status: DC

## 2018-11-17 MED ORDER — PHENTERMINE HCL 37.5 MG PO TABS: ORAL_TABLET | ORAL | 5 refills | Status: DC

## 2018-11-17 MED ORDER — LOSARTAN POTASSIUM 50 MG PO TABS: ORAL_TABLET | ORAL | 1 refills | Status: DC

## 2018-11-17 NOTE — Patient Instructions (Addendum)
Goals    . Blood Pressure < 130/80    . DIET - DECREASE SODA OR JUICE INTAKE    . Exercise 3x per week (30 min per time)    . HEMOGLOBIN A1C < 7.0    . Weight (lb) < 215 lb (97.5 kg)      Please schedule ophthalmology exam and bring report with you to your physical   Switch rosuvastatin from 5 mg to 20 mg daily    Please check fasting glucose - goal is to get <150, then <130   Try to do minimal starch or sugar; high fiber foods (fiber makes up the majority of carb) are ok - most non-starchy veggies, berries, etc  For cholesterol - limit animal/dairy fat (but remember low fat dairy can be higher carb, limit portions) - limit saturated/trans fats (highly processed foods, many oils, lard, butter, crisco, etc)       Bad carbs also include fruit juice, alcohol, and sweet tea. These are empty calories that do not signal to your brain that you are full.   Please remember the good carbs are still carbs which convert into sugar. So please measure them out no more than 1/2-1 cup of rice, oatmeal, pasta, and beans  Veggies are however free foods! Pile them on.   Not all fruit is created equal. Please see the list below, the fruit at the bottom is higher in sugars than the fruit at the top. Please avoid all dried fruits.

## 2018-11-22 LAB — FRUCTOSAMINE: Fructosamine: 281 umol/L (ref 205–285)

## 2018-11-26 ENCOUNTER — Other Ambulatory Visit: Payer: Self-pay | Admitting: Adult Health

## 2018-11-26 MED ORDER — ROSUVASTATIN CALCIUM 20 MG PO TABS: ORAL_TABLET | ORAL | 1 refills | Status: DC

## 2018-11-27 ENCOUNTER — Other Ambulatory Visit: Payer: Self-pay | Admitting: Adult Health

## 2018-11-28 ENCOUNTER — Other Ambulatory Visit: Payer: Self-pay | Admitting: Adult Health

## 2018-12-09 ENCOUNTER — Encounter: Payer: Self-pay | Admitting: Adult Health

## 2018-12-23 ENCOUNTER — Other Ambulatory Visit: Payer: Self-pay | Admitting: Adult Health

## 2018-12-23 DIAGNOSIS — E1165 Type 2 diabetes mellitus with hyperglycemia: Secondary | ICD-10-CM

## 2018-12-23 MED ORDER — LOSARTAN POTASSIUM 25 MG PO TABS: ORAL_TABLET | ORAL | 1 refills | Status: DC

## 2019-01-09 ENCOUNTER — Other Ambulatory Visit: Payer: Self-pay | Admitting: Internal Medicine

## 2019-01-09 ENCOUNTER — Other Ambulatory Visit: Payer: Self-pay | Admitting: Adult Health

## 2019-01-09 DIAGNOSIS — F324 Major depressive disorder, single episode, in partial remission: Secondary | ICD-10-CM

## 2019-03-02 ENCOUNTER — Encounter: Payer: Self-pay | Admitting: Adult Health

## 2019-04-07 ENCOUNTER — Other Ambulatory Visit: Payer: Self-pay | Admitting: Adult Health

## 2019-04-07 DIAGNOSIS — E1165 Type 2 diabetes mellitus with hyperglycemia: Secondary | ICD-10-CM

## 2019-07-01 LAB — HM PAP SMEAR: HM Pap smear: NEGATIVE

## 2019-07-01 LAB — HM MAMMOGRAPHY

## 2019-07-10 ENCOUNTER — Other Ambulatory Visit: Payer: Self-pay | Admitting: Adult Health

## 2019-07-10 ENCOUNTER — Other Ambulatory Visit: Payer: Self-pay | Admitting: Physician Assistant

## 2019-07-10 DIAGNOSIS — M62830 Muscle spasm of back: Secondary | ICD-10-CM

## 2019-07-10 DIAGNOSIS — F324 Major depressive disorder, single episode, in partial remission: Secondary | ICD-10-CM

## 2019-07-10 DIAGNOSIS — E1165 Type 2 diabetes mellitus with hyperglycemia: Secondary | ICD-10-CM

## 2019-08-13 ENCOUNTER — Other Ambulatory Visit: Payer: Self-pay

## 2019-08-13 ENCOUNTER — Ambulatory Visit (INDEPENDENT_AMBULATORY_CARE_PROVIDER_SITE_OTHER): Payer: BC Managed Care – PPO | Admitting: Adult Health

## 2019-08-13 ENCOUNTER — Encounter: Payer: Self-pay | Admitting: Adult Health

## 2019-08-13 VITALS — BP 144/72 | HR 84 | Temp 97.3°F | Wt 236.6 lb

## 2019-08-13 DIAGNOSIS — E785 Hyperlipidemia, unspecified: Secondary | ICD-10-CM | POA: Diagnosis not present

## 2019-08-13 DIAGNOSIS — Z87891 Personal history of nicotine dependence: Secondary | ICD-10-CM

## 2019-08-13 DIAGNOSIS — E1165 Type 2 diabetes mellitus with hyperglycemia: Secondary | ICD-10-CM | POA: Diagnosis not present

## 2019-08-13 DIAGNOSIS — E1122 Type 2 diabetes mellitus with diabetic chronic kidney disease: Secondary | ICD-10-CM

## 2019-08-13 DIAGNOSIS — I10 Essential (primary) hypertension: Secondary | ICD-10-CM | POA: Diagnosis not present

## 2019-08-13 DIAGNOSIS — E559 Vitamin D deficiency, unspecified: Secondary | ICD-10-CM

## 2019-08-13 DIAGNOSIS — Z79899 Other long term (current) drug therapy: Secondary | ICD-10-CM | POA: Diagnosis not present

## 2019-08-13 DIAGNOSIS — E1169 Type 2 diabetes mellitus with other specified complication: Secondary | ICD-10-CM

## 2019-08-13 DIAGNOSIS — E1142 Type 2 diabetes mellitus with diabetic polyneuropathy: Secondary | ICD-10-CM | POA: Diagnosis not present

## 2019-08-13 DIAGNOSIS — F324 Major depressive disorder, single episode, in partial remission: Secondary | ICD-10-CM

## 2019-08-13 DIAGNOSIS — N182 Chronic kidney disease, stage 2 (mild): Secondary | ICD-10-CM

## 2019-08-13 DIAGNOSIS — M62830 Muscle spasm of back: Secondary | ICD-10-CM

## 2019-08-13 MED ORDER — FREESTYLE LITE TEST VI STRP: ORAL_STRIP | 11 refills | Status: DC

## 2019-08-13 MED ORDER — TIZANIDINE HCL 4 MG PO CAPS: ORAL_CAPSULE | ORAL | 0 refills | Status: DC

## 2019-08-13 MED ORDER — BLOOD GLUCOSE MONITORING SUPPL DEVI: 0 refills | Status: DC

## 2019-08-13 MED ORDER — OZEMPIC (0.25 OR 0.5 MG/DOSE) 2 MG/1.5ML ~~LOC~~ SOPN: PEN_INJECTOR | SUBCUTANEOUS | 0 refills | Status: DC

## 2019-08-13 MED ORDER — SERTRALINE HCL 100 MG PO TABS: ORAL_TABLET | ORAL | 2 refills | Status: DC

## 2019-08-13 MED ORDER — ROSUVASTATIN CALCIUM 20 MG PO TABS: ORAL_TABLET | ORAL | 1 refills | Status: DC

## 2019-08-13 MED ORDER — LANCETS MISC: 11 refills | Status: DC

## 2019-08-13 NOTE — Progress Notes (Signed)
Assessment and Plan:  Danielle Mccall was seen today for follow-up.  Diagnoses and all orders for this visit:  Essential hypertension Continue medications; atypically elevated today likely r/t oral pain; has been controlled otherwise Monitor blood pressure at home; call if consistently over 130/80 Continue DASH diet.   Reminder to go to the ER if any CP, SOB, nausea, dizziness, severe HA, changes vision/speech, left arm numbness and tingling and jaw pain.  Type 2 diabetes mellitus with hyperglycemia, without long-term current use of insulin (HCC) Continue metformin, amaryl, off jardiance due to infections Hasn't been checking glucose; glucometer reordered Education: Reviewed 'ABCs' of diabetes management (respective goals in parentheses):  A1C (<7), blood pressure (<130/80), and cholesterol (LDL <70) Start ozempic - 0.25 mg weekly x 4 weeks, 0.5 mg weekly x 4 weeks, then plan to increase to 1 mg weekly if tolerating WIll follow up sooner in 4-6 weeks for diabetes education if A1C 9+   Hyperlipidemia associated with type 2 diabetes mellitus (HCC) Tolerating rosuvastatin 20 mg daily; slow taper due to hx of statin intolerance Continue low cholesterol diet and exercise.  Check lipid panel.   Morbid obesity Long discussion about weight loss, diet, and exercise Recommended diet heavy in fruits and veggies and low in animal meats, cheeses, and dairy products, appropriate calorie intake. Suggested mediterranean diet which the patient enjoys She never did start phentermine/topamax; after consideration and discussion with GYN she is considering pursuing bariatric surgery; discussed options However, we would need to improve her A1C control prior to surgery; after discussion will initiate ozempic, plan to follow up in 3 months and place referral at that time Discussed 7 day free trial of noom app to help with consistency and learning lifestyle habits Follow up at next visit  Major depressive disorder  in partial remission, unspecified whether recurrent (HCC) Continue wellbutrin 300 mg; will try slow taper up on zoloft; increase to 150 mg 4 weeks, then 200 mg daily if needed Lifestyle discussed: diet/exerise, sleep hygiene, stress management, hydration   Further disposition pending results of labs. Discussed med's effects and SE's.   Over 30 minutes of exam, counseling, chart review, and critical decision making was performed.   Future Appointments  Date Time Provider Department Center  11/16/2019  2:00 PM Judd Gaudier, NP [facility] None    ------------------------------------------------------------------------------------------------------------------   HPI 54 y.o.female presents for 3 month follow up on hypertension, cholesterol, diabetes, weight and vitamin D deficiency.   Was last seen 11/2018, Has been chaotic due to husband being hospitalized, got married, mom passed away this year.   She reports had tooth pulled by dental [facility]) last week, having some swelling and tenderness in jaw. Denies fever/chills, foul taste in mouth.    she has a diagnosis of depression/anxiety and is currently on wellbutrin 300 mg daily, added buspar 10 mg BID but felt terrible, didn't like lexapro, d/c'd and restarted zoloft, doing 100 mg daily but having some severe down days around her menstrual cycle.   BMI is Body mass index is 36.51 kg/m., she has not been able to work on diet and exercise due to husband's health. She is interested in phentermine/topamax, was sent in but never picked up. She has considered weight loss options and strongly preferring to discuss bariatric surgery. We reviewed options today. She would need improved glucose control prior to surgery-  Wt Readings from Last 3 Encounters:  08/13/19 (!) 236 lb 9.6 oz (107.3 kg)  11/17/18 233 lb 12.8 oz (106.1 kg)  10/13/18  238 lb (108 kg)   Her blood pressure has been controlled at home (120s/60s), today their  BP is BP: (!) 144/72 (attributes to oral pain,had tooth pulled, atypical).   She does not workout. She denies chest pain, shortness of breath, dizziness.   She has been working on diet and exercise for T2DM (on metformin 2000 mg daily, amaryl 4 mg BID, stopped jardiance due to yeast), and denies hypoglycemia , nausea, polydipsia, polyuria and visual disturbances.  She does report tingling in bil feet, without breakdown  Admits meter has been out of battery, hasn't been checking Last A1C in the office was:  Lab Results  Component Value Date   HGBA1C 10.6 (H) 10/13/2018    She is on cholesterol medication (rosuvastatin 20 mg daily, switched from pravastatin) and denies myalgias. Her cholesterol is not at goal. The cholesterol last visit was:   Lab Results  Component Value Date   CHOL 241 (H) 10/13/2018   HDL 67 10/13/2018   LDLCALC 143 (H) 10/13/2018   TRIG 177 (H) 10/13/2018   CHOLHDL 3.6 10/13/2018   Lab Results  Component Value Date   GFRNONAA 64 10/13/2018   Patient is on multivitamin  Lab Results  Component Value Date   VD25OH 30 05/26/2018        Past Medical History:  Diagnosis Date  . Diabetes mellitus without complication (HCC)   . Leg pain, left   . 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 Atorvastatin     Myalgias  . Buspirone     "felt like I was going to pass out"     Current Outpatient Medications on File Prior to Visit  Medication Sig  . aspirin EC 81 MG tablet Take 81 mg by mouth daily.  Marland Kitchen buPROPion (WELLBUTRIN XL) 300 MG 24 hr tablet TAKE 1 TABLET BY MOUTH DAILY  . glimepiride (AMARYL) 4 MG tablet TAKE 1 TO 2 TABLETS BY MOUTH DAILY WITH BREAKFAST FOR DIABETES. Need to schedule follow up for overdue labs prior to further refills.  Marland Kitchen glucose blood test strip Use as instructed  . hydrochlorothiazide (HYDRODIURIL) 25 MG tablet TAKE 1 TABLET(25 MG) BY MOUTH DAILY  . losartan (COZAAR) 25 MG tablet TAKE 1 TABLET BY MOUTH DAILY TO  PROTECT KIDNEYS  . metFORMIN (GLUCOPHAGE-XR) 500 MG 24 hr tablet TAKE 2 TABLETS(1000 MG) BY MOUTH TWICE DAILY WITH A MEAL  . terbinafine (LAMISIL) 250 MG tablet Take 1 Tablet Daily for 1 month, then stop for 1 month. Repeat for 3 cycles  . JARDIANCE 25 MG TABS tablet TAKE 1 TABLET BY MOUTH DAILY (Patient not taking: Reported on 08/13/2019)  . naproxen (EC-NAPROSYN) 500 MG EC tablet Take 1 tablet (500 mg total) by mouth 2 (two) times daily with a meal. (Patient not taking: Reported on 10/13/2018)  . phentermine (ADIPEX-P) 37.5 MG tablet Take 1/2 to 1 tablet every morning for dieting & weightloss (Patient not taking: Reported on 08/13/2019)  . topiramate (TOPAMAX) 25 MG tablet TAKE 1 TABLET(25 MG) BY MOUTH AT BEDTIME FOR WEIGHT LOSS (Patient not taking: Reported on 08/13/2019)   No current facility-administered medications on file prior to visit.    ROS: Review of Systems  Constitutional: Negative for malaise/fatigue and weight loss.  HENT: Negative for hearing loss, sore throat and tinnitus.   Eyes: Negative for blurred vision and double vision.  Respiratory: Negative for cough, shortness of breath and wheezing.   Cardiovascular: Negative for chest pain, palpitations, orthopnea, claudication and  leg swelling.  Gastrointestinal: Negative for abdominal pain, blood in stool, constipation, diarrhea, heartburn, melena, nausea and vomiting.  Genitourinary: Negative.   Musculoskeletal: Negative for joint pain and myalgias.  Skin: Negative for rash.  Neurological: Negative for dizziness, tingling, sensory change, weakness and headaches.  Endo/Heme/Allergies: Negative for polydipsia.  Psychiatric/Behavioral: Positive for depression. Negative for substance abuse.  All other systems reviewed and are negative.    Physical Exam:  BP (!) 144/72   Pulse 84   Temp (!) 97.3 F (36.3 C)   Wt (!) 236 lb 9.6 oz (107.3 kg)   SpO2 96%   BMI 36.51 kg/m   General Appearance: Well nourished, in no apparent  distress. Eyes: PERRLA, EOMs, conjunctiva no swelling or erythema Sinuses: No Frontal/maxillary tenderness ENT/Mouth: Ext aud canals clear, TMs without erythema, bulging. No erythema, swelling, or exudate on post pharynx.  Tonsils not swollen or erythematous. Poor dentition. R lower molar absence without erythema, gum tenderness, abscess, glands non-swollen; she has some soft tissue swelling along her jaw R without erythema. Hearing normal.  Neck: Supple, thyroid normal.  Respiratory: Respiratory effort normal, BS equal bilaterally without rales, rhonchi, wheezing or stridor.  Cardio: RRR with no MRGs. Brisk peripheral pulses without edema.  Abdomen: Soft, obese abdomen, + BS.  Non tender, no guarding, rebound, hernias, masses. Lymphatics: Non tender except along jaw without distinct palpable lymphadenopathy.  Musculoskeletal:  symmetrical strength, normal gait.  Skin: Warm, dry without rashes, lesions, ecchymosis.  Neuro: Cranial nerves intact. Normal muscle tone, no cerebellar symptoms. Sensation intact.  Psych: Awake and oriented X 3, normal affect, Insight and Judgment appropriate.     Dan Maker, NP 5:09 PM [facility]

## 2019-08-13 NOTE — Patient Instructions (Addendum)
Goals    . Blood Pressure < 130/80    . DIET - DECREASE SODA OR JUICE INTAKE    . Exercise 3x per week (30 min per time)    . HEMOGLOBIN A1C < 7.0    . Weight (lb) < 215 lb (97.5 kg)       New med ozempic - start with 0.25 mg weekly for 4 weeks  Then increase to 0.5 mg weekly for 4 weeks  If tolerating, message back and I will send in next dose up   Semaglutide injection solution What is this medicine? SEMAGLUTIDE (Sem a GLOO tide) is used to improve blood sugar control in adults with type 2 diabetes. This medicine may be used with other diabetes medicines. This drug may also reduce the risk of heart attack or stroke if you have type 2 diabetes and risk factors for heart disease. This medicine may be used for other purposes; ask your health care provider or pharmacist if you have questions. COMMON BRAND NAME(S): OZEMPIC What should I tell my health care provider before I take this medicine? They need to know if you have any of these conditions:  endocrine tumors (MEN 2) or if someone in your family had these tumors  eye disease, vision problems  history of pancreatitis  kidney disease  stomach problems  thyroid cancer or if someone in your family had thyroid cancer  an unusual or allergic reaction to semaglutide, other medicines, foods, dyes, or preservatives  pregnant or trying to get pregnant  breast-feeding How should I use this medicine? This medicine is for injection under the skin of your upper leg (thigh), stomach area, or upper arm. It is given once every week (every 7 days). You will be taught how to prepare and give this medicine. Use exactly as directed. Take your medicine at regular intervals. Do not take it more often than directed. If you use this medicine with insulin, you should inject this medicine and the insulin separately. Do not mix them together. Do not give the injections right next to each other. Change (rotate) injection sites with each  injection. It is important that you put your used needles and syringes in a special sharps container. Do not put them in a trash can. If you do not have a sharps container, call your pharmacist or healthcare provider to get one. A special MedGuide will be given to you by the pharmacist with each prescription and refill. Be sure to read this information carefully each time. This drug comes with INSTRUCTIONS FOR USE. Ask your pharmacist for directions on how to use this drug. Read the information carefully. Talk to your pharmacist or health care provider if you have questions. Talk to your pediatrician regarding the use of this medicine in children. Special care may be needed. Overdosage: If you think you have taken too much of this medicine contact a poison control center or emergency room at once. NOTE: This medicine is only for you. Do not share this medicine with others. What if I miss a dose? If you miss a dose, take it as soon as you can within 5 days after the missed dose. Then take your next dose at your regular weekly time. If it has been longer than 5 days after the missed dose, do not take the missed dose. Take the next dose at your regular time. Do not take double or extra doses. If you have questions about a missed dose, contact your health care provider for advice.  What may interact with this medicine?  other medicines for diabetes Many medications may cause changes in blood sugar, these include:  alcohol containing beverages  antiviral medicines for HIV or AIDS  aspirin and aspirin-like drugs  certain medicines for blood pressure, heart disease, irregular heart beat  chromium  diuretics  female hormones, such as estrogens or progestins, birth control pills  fenofibrate  gemfibrozil  isoniazid  lanreotide  female hormones or anabolic steroids  MAOIs like Carbex, Eldepryl, Marplan, Nardil, and Parnate  medicines for weight loss  medicines for allergies, asthma,  cold, or cough  medicines for depression, anxiety, or psychotic disturbances  niacin  nicotine  NSAIDs, medicines for pain and inflammation, like ibuprofen or naproxen  octreotide  pasireotide  pentamidine  phenytoin  probenecid  quinolone antibiotics such as ciprofloxacin, levofloxacin, ofloxacin  some herbal dietary supplements  steroid medicines such as prednisone or cortisone  sulfamethoxazole; trimethoprim  thyroid hormones Some medications can hide the warning symptoms of low blood sugar (hypoglycemia). You may need to monitor your blood sugar more closely if you are taking one of these medications. These include:  beta-blockers, often used for high blood pressure or heart problems (examples include atenolol, metoprolol, propranolol)  clonidine  guanethidine  reserpine This list may not describe all possible interactions. Give your health care provider a list of all the medicines, herbs, non-prescription drugs, or dietary supplements you use. Also tell them if you smoke, drink alcohol, or use illegal drugs. Some items may interact with your medicine. What should I watch for while using this medicine? Visit your doctor or health care professional for regular checks on your progress. Drink plenty of fluids while taking this medicine. Check with your doctor or health care professional if you get an attack of severe diarrhea, nausea, and vomiting. The loss of too much body fluid can make it dangerous for you to take this medicine. A test called the HbA1C (A1C) will be monitored. This is a simple blood test. It measures your blood sugar control over the last 2 to 3 months. You will receive this test every 3 to 6 months. Learn how to check your blood sugar. Learn the symptoms of low and high blood sugar and how to manage them. Always carry a quick-source of sugar with you in case you have symptoms of low blood sugar. Examples include hard sugar candy or glucose tablets.  Make sure others know that you can choke if you eat or drink when you develop serious symptoms of low blood sugar, such as seizures or unconsciousness. They must get medical help at once. Tell your doctor or health care professional if you have high blood sugar. You might need to change the dose of your medicine. If you are sick or exercising more than usual, you might need to change the dose of your medicine. Do not skip meals. Ask your doctor or health care professional if you should avoid alcohol. Many nonprescription cough and cold products contain sugar or alcohol. These can affect blood sugar. Pens should never be shared. Even if the needle is changed, sharing may result in passing of viruses like hepatitis or HIV. Wear a medical ID bracelet or chain, and carry a card that describes your disease and details of your medicine and dosage times. Do not become pregnant while taking this medicine. Women should inform their doctor if they wish to become pregnant or think they might be pregnant. There is a potential for serious side effects to an unborn child.  Talk to your health care professional or pharmacist for more information. What side effects may I notice from receiving this medicine? Side effects that you should report to your doctor or health care professional as soon as possible:  allergic reactions like skin rash, itching or hives, swelling of the face, lips, or tongue  breathing problems  changes in vision  diarrhea that continues or is severe  lump or swelling on the neck  severe nausea  signs and symptoms of infection like fever or chills; cough; sore throat; pain or trouble passing urine  signs and symptoms of low blood sugar such as feeling anxious, confusion, dizziness, increased hunger, unusually weak or tired, sweating, shakiness, cold, irritable, headache, blurred vision, fast heartbeat, loss of consciousness  signs and symptoms of kidney injury like trouble passing urine  or change in the amount of urine  trouble swallowing  unusual stomach upset or pain  vomiting Side effects that usually do not require medical attention (report to your doctor or health care professional if they continue or are bothersome):  constipation  diarrhea  nausea  pain, redness, or irritation at site where injected  stomach upset This list may not describe all possible side effects. Call your doctor for medical advice about side effects. You may report side effects to FDA at 1-800-FDA-1088. Where should I keep my medicine? Keep out of the reach of children. Store unopened pens in a refrigerator between 2 and 8 degrees C (36 and 46 degrees F). Do not freeze. Protect from light and heat. After you first use the pen, it can be stored for 56 days at room temperature between 15 and 30 degrees C (59 and 86 degrees F) or in a refrigerator. Throw away your used pen after 56 days or after the expiration date, whichever comes first. Do not store your pen with the needle attached. If the needle is left on, medicine may leak from the pen. NOTE: This sheet is a summary. It may not cover all possible information. If you have questions about this medicine, talk to your doctor, pharmacist, or health care provider.  2020 [employer]vier/Gold Standard (2018-09-16 09:41:51)

## 2019-08-14 ENCOUNTER — Other Ambulatory Visit: Payer: Self-pay | Admitting: Adult Health

## 2019-08-14 LAB — COMPLETE METABOLIC PANEL WITH GFR
AG Ratio: 1.3 (calc) (ref 1.0–2.5)
ALT: 16 U/L (ref 6–29)
AST: 15 U/L (ref 10–35)
Albumin: 4.1 g/dL (ref 3.6–5.1)
Alkaline phosphatase (APISO): 71 U/L (ref 37–153)
BUN/Creatinine Ratio: 34 (calc) — ABNORMAL HIGH (ref 6–22)
BUN: 26 mg/dL — ABNORMAL HIGH (ref 7–25)
CO2: 27 mmol/L (ref 20–32)
Calcium: 9.7 mg/dL (ref 8.6–10.4)
Chloride: 101 mmol/L (ref 98–110)
Creat: 0.77 mg/dL (ref 0.50–1.05)
GFR, Est African American: 104 mL/min/{1.73_m2} (ref >=60)
GFR, Est Non African American: 89 mL/min/{1.73_m2} (ref >=60)
Globulin: 3.1 g/dL (calc) (ref 1.9–3.7)
Glucose, Bld: 162 mg/dL — ABNORMAL HIGH (ref 65–99)
Potassium: 4.7 mmol/L (ref 3.5–5.3)
Sodium: 137 mmol/L (ref 135–146)
Total Bilirubin: 0.2 mg/dL (ref 0.2–1.2)
Total Protein: 7.2 g/dL (ref 6.1–8.1)

## 2019-08-14 LAB — CBC WITH DIFFERENTIAL/PLATELET
Absolute Monocytes: 568 cells/uL (ref 200–950)
Basophils Absolute: 118 cells/uL (ref 0–200)
Basophils Relative: 1.2 %
Eosinophils Absolute: 323 cells/uL (ref 15–500)
Eosinophils Relative: 3.3 %
HCT: 40 % (ref 35.0–45.0)
Hemoglobin: 12.9 g/dL (ref 11.7–15.5)
Lymphs Abs: 3352 cells/uL (ref 850–3900)
MCH: 28.2 pg (ref 27.0–33.0)
MCHC: 32.3 g/dL (ref 32.0–36.0)
MCV: 87.3 fL (ref 80.0–100.0)
MPV: 10.2 fL (ref 7.5–12.5)
Monocytes Relative: 5.8 %
Neutro Abs: 5439 cells/uL (ref 1500–7800)
Neutrophils Relative %: 55.5 %
Platelets: 398 10*3/uL (ref 140–400)
RBC: 4.58 10*6/uL (ref 3.80–5.10)
RDW: 12.2 % (ref 11.0–15.0)
Total Lymphocyte: 34.2 %
WBC: 9.8 10*3/uL (ref 3.8–10.8)

## 2019-08-14 LAB — HEMOGLOBIN A1C
Hgb A1c MFr Bld: 10.8 % of total Hgb — ABNORMAL HIGH (ref <5.7)
Mean Plasma Glucose: 263 (calc)
eAG (mmol/L): 14.6 (calc)

## 2019-08-14 LAB — TSH: TSH: 2.63 mIU/L

## 2019-08-14 LAB — LIPID PANEL
Cholesterol: 259 mg/dL — ABNORMAL HIGH (ref <200)
HDL: 60 mg/dL (ref >=50)
LDL Cholesterol (Calc): 152 mg/dL (calc) — ABNORMAL HIGH
Non-HDL Cholesterol (Calc): 199 mg/dL (calc) — ABNORMAL HIGH (ref <130)
Total CHOL/HDL Ratio: 4.3 (calc) (ref <5.0)
Triglycerides: 287 mg/dL — ABNORMAL HIGH (ref <150)

## 2019-08-14 LAB — MAGNESIUM: Magnesium: 1.7 mg/dL (ref 1.5–2.5)

## 2019-08-14 MED ORDER — ROSUVASTATIN CALCIUM 40 MG PO TABS: ORAL_TABLET | ORAL | 1 refills | Status: DC

## 2019-09-22 NOTE — Progress Notes (Deleted)
Diabetes Education and Follow-Up Visit  54 y.o.female presents for diabetic education. She has Diabetes Mellitus type 2:  with diabetic chronic kidney disease, she is on bASA, and denies foot ulcerations, hypoglycemia , nausea, polydipsia, polyuria and weight loss. She does endorse blurry vision and burning sensation in feet. ***  Last hemoglobin A1c was: Lab Results  Component Value Date   HGBA1C 10.8 (H) 08/13/2019   HGBA1C 10.6 (H) 10/13/2018   HGBA1C 9.9 (H) 05/26/2018   she is prescribed phentermine and topamax for weight loss. While on the medication they have lost *** lbs since last visit.   BMI is There is no height or weight on file to calculate BMI., she is working on diet and exercise. She following low carb diet with her husband. Has cut out soda.  Wt Readings from Last 3 Encounters:  08/13/19 (!) 236 lb 9.6 oz (107.3 kg)  11/17/18 233 lb 12.8 oz (106.1 kg)  10/13/18 238 lb (108 kg)   Typical breakfast: often skips, may have omlette, cottage cheese with eggs,  Typical lunch: zero carb bread with lunch meat, tuna fish, salad, cuties/fruit Typical dinner: sweet potato if she has carb, typically protein with vegetable, or soup Exercise: minimal, plans to start walking once husband's health improves Water intake: Improved, using refillable bottle with flavors, rare diet soda  Pt is on a regimen of: metformin, glimepiride 4 mg BID, jardiance 25 mg, newly on ozempic ***  Pt checks her sugars 2 x day, fasting and typically will check before bedtime Ranged 74-319 - not distinguishing between fasting and bedtime checks Lowest sugar was 74.  She has hypoglycemia awareness.  Highest sugar was 319 Glucometer: trumetrics   Ophthalmology exam: needs to schedule  Patient does have CKD She is on ACE/ARB   Lab Results  Component Value Date   GFRNONAA 89 08/13/2019    Lab Results  Component Value Date   CREATININE 0.77 08/13/2019   BUN 26 (H) 08/13/2019   NA 137 08/13/2019    K 4.7 08/13/2019   CL 101 08/13/2019   CO2 27 08/13/2019    Lab Results  Component Value Date   MICROALBUR 3.0 02/13/2018     She is on a Statin. She is tolerating rosuvastatin daily despite reported hx of statin intolerance, was increased gradually, now taking ***, was off med at last visit She is not at goal of less than 70.  Lab Results  Component Value Date   CHOL 259 (H) 08/13/2019   HDL 60 08/13/2019   LDLCALC 152 (H) 08/13/2019   TRIG 287 (H) 08/13/2019   CHOLHDL 4.3 08/13/2019     Problem List has Type 2 diabetes mellitus with hyperglycemia, without long-term current use of insulin (HCC); Hypertension; Hyperlipidemia associated with type 2 diabetes mellitus (HCC); Morbid obesity (HCC); Major depression in partial remission (HCC); Former smoker; Onychomycosis; Chronic venous insufficiency; Vitamin D deficiency; CKD stage 2 due to type 2 diabetes mellitus (HCC); and Diabetic peripheral neuropathy associated with type 2 diabetes mellitus (HCC) on their problem list.  Medications Current Outpatient Medications on File Prior to Visit  Medication Sig  . aspirin EC 81 MG tablet Take 81 mg by mouth daily.  . Blood Glucose Monitoring Suppl DEVI Test blood sugar once daily  . buPROPion (WELLBUTRIN XL) 300 MG 24 hr tablet TAKE 1 TABLET BY MOUTH DAILY  . glimepiride (AMARYL) 4 MG tablet TAKE 1 TO 2 TABLETS BY MOUTH DAILY WITH BREAKFAST FOR DIABETES. Need to schedule follow up for overdue  labs prior to further refills.  Marland Kitchen glucose blood (FREESTYLE LITE) test strip Test blood sugar once daily  . glucose blood test strip Use as instructed  . hydrochlorothiazide (HYDRODIURIL) 25 MG tablet TAKE 1 TABLET(25 MG) BY MOUTH DAILY  . JARDIANCE 25 MG TABS tablet TAKE 1 TABLET BY MOUTH DAILY (Patient not taking: Reported on 08/13/2019)  . Lancets MISC Test blood sugar once daily  . losartan (COZAAR) 25 MG tablet TAKE 1 TABLET BY MOUTH DAILY TO PROTECT KIDNEYS  . metFORMIN (GLUCOPHAGE-XR) 500 MG 24  hr tablet TAKE 2 TABLETS(1000 MG) BY MOUTH TWICE DAILY WITH A MEAL  . naproxen (EC-NAPROSYN) 500 MG EC tablet Take 1 tablet (500 mg total) by mouth 2 (two) times daily with a meal. (Patient not taking: Reported on 10/13/2018)  . phentermine (ADIPEX-P) 37.5 MG tablet Take 1/2 to 1 tablet every morning for dieting & weightloss (Patient not taking: Reported on 08/13/2019)  . rosuvastatin (CRESTOR) 40 MG tablet Takes 1 tablet everyday  . Semaglutide,0.25 or 0.5MG /DOS, (OZEMPIC, 0.25 OR 0.5 MG/DOSE,) 2 MG/1.5ML SOPN Inject 0.25 mg subcutaneously once weekly into abdomen for 4 weeks. Then increase to 0.5 mg weekly.  . sertraline (ZOLOFT) 100 MG tablet TAKE 2 TABLET EVERY DAY FOR MOOD.  Marland Kitchen terbinafine (LAMISIL) 250 MG tablet Take 1 Tablet Daily for 1 month, then stop for 1 month. Repeat for 3 cycles  . tiZANidine (ZANAFLEX) 4 MG capsule TAKE 1 CAPSULE(4 MG) BY MOUTH THREE TIMES DAILY AS NEEDED FOR MUSCLE SPASMS  . topiramate (TOPAMAX) 25 MG tablet TAKE 1 TABLET(25 MG) BY MOUTH AT BEDTIME FOR WEIGHT LOSS (Patient not taking: Reported on 08/13/2019)   No current facility-administered medications on file prior to visit.    ROS- see HPI  Physical Exam: There were no vitals taken for this visit. There is no height or weight on file to calculate BMI. General Appearance: Well nourished, in no apparent distress. Eyes: PERRLA, EOMs, conjunctiva no swelling or erythema ENT/Mouth: Ext aud canals clear, TMs without erythema, bulging. No erythema, swelling, or exudate on post pharynx.  Tonsils not swollen or erythematous. Hearing normal.  Respiratory: Respiratory effort normal, BS equal bilaterally without rales, rhonchi, wheezing or stridor.  Cardio: RRR with no MRGs. Brisk peripheral pulses without edema.  Abdomen: Soft, + BS.  Non tender, no guarding, rebound, hernias, masses. Musculoskeletal: Full ROM, 5/5 strength, normal gait.  Skin: Warm, dry without rashes, lesions, ecchymosis.  Neuro: Cranial nerves  intact. Normal muscle tone, no cerebellar symptoms. Sensation intact.    Plan and Assessment: Diabetes Education: Reviewed 'ABCs' of diabetes management (respective goals in parentheses):  A1C (<7), blood pressure (<130/80), and cholesterol (LDL <70) Eye Exam yearly and Dental Exam every 6 months. Dietary recommendations Physical Activity recommendations - Strongly advised her to start keeping a log of fasting glucose, goal to get <150, then consistently <130 -  - given sugar log and advised how to fill it and to bring it at next appt  - given foot care handout and explained the principles  - given instructions for hypoglycemia management  - increase rosuvastatin from 5 mg to 20 mg  - schedule diabetic eye and bring report to CPE -     Fructosamine  Hyperlipidemia associated with type 2 diabetes mellitus (HCC) Increase rosuvastatin from 5 mg to 20 mg daily   Morbid obesity (HCC) Obesity with co morbidities Long discussion about weight loss, diet, and exercise Discussed healthy weight Patient starting on phentermine/topamax  drug breaks; continue close follow up.  Return in 2 months  Essential hypertension Continue medication Monitor blood pressure at home; call if consistently over 130/80 Continue DASH diet.   Reminder to go to the ER if any CP, SOB, nausea, dizziness, severe HA, changes vision/speech, left arm numbness and tingling and jaw pain.  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   Other orders -     phentermine (ADIPEX-P) 37.5 MG tablet; Take 1/2 to 1 tablet every morning for dieting & weightloss -     topiramate (TOPAMAX) 25 MG tablet; TAKE 1 TABLET(25 MG) BY MOUTH AT BEDTIME FOR WEIGHT LOSS -     rosuvastatin (CRESTOR) 20 MG tablet; Takes 1 tablet everyday -     losartan  (COZAAR) 50 MG tablet; TAKE 1 TABLET(50 MG) BY MOUTH DAILY   Future Appointments  Date Time Provider Department Center  09/23/2019  4:00 PM Judd Gaudier, NP [facility] None  11/16/2019  2:00 PM Elder Negus, NP [facility] None

## 2019-09-23 ENCOUNTER — Ambulatory Visit: Payer: BC Managed Care – PPO | Admitting: Adult Health

## 2019-10-02 ENCOUNTER — Other Ambulatory Visit: Payer: Self-pay | Admitting: Adult Health

## 2019-10-02 DIAGNOSIS — E1165 Type 2 diabetes mellitus with hyperglycemia: Secondary | ICD-10-CM

## 2019-11-16 ENCOUNTER — Encounter: Payer: BC Managed Care – PPO | Admitting: Adult Health Nurse Practitioner

## 2019-11-30 ENCOUNTER — Encounter: Payer: BC Managed Care – PPO | Admitting: Adult Health

## 2020-01-13 NOTE — Progress Notes (Deleted)
Complete Physical  Assessment and Plan:   Encounter for routine medical examination with abnormal findings Patient will schedule GYN appointment with Dr. Rosemary Holmsavon, and diabetic eye exam with ophthalmology  Type 2 diabetes mellitus with hyperglycemia, without long-term current use of insulin (HCC) Currently on metformin 2000 mg daily, amaryl 4 mg daily but forgetting to take, would prefer SGLT 2 or GLP 1 agent - pending lab results Education: Reviewed 'ABCs' of diabetes management (respective goals in parentheses):  A1C (<7), blood pressure (<130/80), and cholesterol (LDL <70) Eye Exam yearly and Dental Exam every 6 months- requested to forward diabetic eye exam report Dietary recommendations Physical Activity recommendations -     COMPLETE METABOLIC PANEL WITH GFR -     Hemoglobin A1c  Hyperlipidemia associated with type 2 diabetes mellitus (HCC) Has been statin intolerant;  Will initiate zetia 10 mg daily  Continue low cholesterol diet and exercise.  Check lipid panel.  -     Lipid panel -     TSH  Obesity (BMI 30.0-34.9) Long discussion about weight loss, diet, and exercise Recommended diet heavy in fruits and veggies and low in animal meats, cheeses, and dairy products, appropriate calorie intake Discussed appropriate weight for height and initial goal (215 lb) Follow up at next visit  Major depressive disorder in partial remission, unspecified whether recurrent (HCC) Continue medications  Lifestyle discussed: diet/exerise, sleep hygiene, stress management, hydration -     escitalopram (LEXAPRO) 10 MG tablet; Take 1 tablet (10 mg total) by mouth daily.  Hypertension Start losartan/hctz 50-12.5 - start with 1/2 tab daily, increase to full tab if needed in 2 weeks for BP goal  Monitor blood pressure at home; call if consistently over 130/80 Continue DASH diet.   Reminder to go to the ER if any CP, SOB, nausea, dizziness, severe HA, changes vision/speech, left arm numbness and  tingling and jaw pain.  Medication management -     CBC with Differential/Platelet -     COMPLETE METABOLIC PANEL WITH GFR -     Magnesium  Former smoker 30+ year smoking hx; doesn't qualify for low dose CT due to age, start at age 54 Recent CXR clear  Onychomycosis  - has tried OTC meds, willing to try Lamisil, side effects discussed, will follow up in 6 weeks for LFTs, cheapest at [employer]  Edema/venous insufficiency - elevate legs TID, increase activity, increase water, decrease sodium intake.  Wear compression socks more routinely if available. Return to the office if no change with symptoms. Check labs.  Colon cancer screening Colonoscopy- patient declines a colonoscopy even though the risks and benefits were discussed at length. Colon cancer is 3rd most diagnosed cancer and 2nd leading cause of death in both men and women 52 years of age and older. Patient understands the risk of cancer and death with declining the test however they are willing to do cologuard screening instead. They understand that this is not as sensitive or specific as a colonoscopy and they are still recommended to get a colonoscopy. The cologuard will be sent out to their house. ***   No orders of the defined types were placed in this encounter.  Discussed med's effects and SE's. Screening labs and tests as requested with regular follow-up as recommended. Over 40 minutes of exam, counseling, chart review, and complex, high level critical decision making was performed this visit.   Future Appointments  Date Time Provider Department Center  01/14/2020  2:00 PM Judd Gaudierorbett, Demetric Parslow, NP [facility] None  01/17/2021  2:00 PM Judd Gaudier, NP [facility] None    HPI  54 y.o. female  presents to establish care. She has Type 2 diabetes mellitus with hyperglycemia, without long-term current use of insulin (HCC); Hypertension; Hyperlipidemia associated with type 2 diabetes mellitus (HCC); Morbid  obesity (HCC); Major depression in partial remission (HCC); Former smoker; Onychomycosis; Chronic venous insufficiency; Vitamin D deficiency; CKD stage 2 due to type 2 diabetes mellitus (HCC); and Diabetic peripheral neuropathy associated with type 2 diabetes mellitus (HCC) on their problem list.   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 She was adopted. She is married with 1 living child, daughter *** (lost son to rare renal disease).   She reports had tooth pulled by dental [facility]) last week, having some swelling and tenderness in jaw. Denies fever/chills, foul taste in mouth.    she has a diagnosis of depression/anxiety and is currently on wellbutrin 300 mg daily, added buspar 10 mg BID but felt terrible, didn't like lexapro, d/c'd and restarted zoloft, doing 100 mg daily but having some severe down days around her menstrual cycle.   She does have some intermittent mid back pain improved with chiropractic treatments and tizanidine 4 mg at night.   She is former smoker with 30+ pack year history, quit smoking 04/2017, had CXR 09/2017 that was clear.   lamisil ***  BMI is There is no height or weight on file to calculate BMI.,  she has not been able to work on diet and exercise due to husband's health. She is interested in phentermine/topamax, was sent in but never picked up. She has considered weight loss options and strongly preferring to discuss bariatric surgery. We reviewed options last visit, She would need improved glucose control prior to surgery- sent in ozempic to trial but lost to follow up ***  Wt Readings from Last 3 Encounters:  08/13/19 (!) 236 lb 9.6 oz (107.3 kg)  11/17/18 233 lb 12.8 oz (106.1 kg)  10/13/18 238 lb (108 kg)   Today their BP is    She does not workout. She denies chest pain, shortness of breath, dizziness.   She is on cholesterol medication (didn't tolerate atorvastatin, pravastatin,  had stiffness and joint achiness, now taking rosuvastatin 40 mg ***). Her cholesterol is not at goal. The cholesterol last visit was:   Lab Results  Component Value Date   CHOL 259 (H) 08/13/2019   HDL 60 08/13/2019   LDLCALC 152 (H) 08/13/2019   TRIG 287 (H) 08/13/2019   CHOLHDL 4.3 08/13/2019   She has been working on diet and exercise for T2DM (on metformin ER 1000 mg BID, glimepiride 4 mg, jardiance,  ozempic ***), she is on bASA, she is not on ACE/ARB, she is not on statin due to intolerance and denies foot ulcerations, increased appetite, nausea, paresthesia of the feet, polydipsia, polyuria, visual disturbances, vomiting and weight loss. She does check an occasional sugar but not checking consistently.  Last A1C in the office was:  Lab Results  Component Value Date   HGBA1C 10.8 (H) 08/13/2019   Last GFR: Lab Results  Component Value Date   GFRNONAA 89 08/13/2019   Patient is not currently on Vitamin D supplement. Will check levels today  Lab Results  Component Value Date   VD25OH 30 05/26/2018      No results found for: IRON, TIBC, FERRITIN No results found for: VITAMINB12   Current Medications:  Current Outpatient  Medications on File Prior to Visit  Medication Sig Dispense Refill  . aspirin EC 81 MG tablet Take 81 mg by mouth daily.    . Blood Glucose Monitoring Suppl DEVI Test blood sugar once daily 1 each 0  . buPROPion (WELLBUTRIN XL) 300 MG 24 hr tablet TAKE 1 TABLET BY MOUTH DAILY 90 tablet 1  . glimepiride (AMARYL) 4 MG tablet TAKE 1 TO 2 TABLETS BY MOUTH DAILY WITH BREAKFAST FOR DIABETES. Need to schedule follow up for overdue labs prior to further refills. 60 tablet 0  . glucose blood (FREESTYLE LITE) test strip Test blood sugar once daily 100 each 11  . glucose blood test strip Use as instructed 100 each 12  . hydrochlorothiazide (HYDRODIURIL) 25 MG tablet TAKE 1 TABLET(25 MG) BY MOUTH DAILY 90 tablet 1  . JARDIANCE 25 MG TABS tablet TAKE 1 TABLET BY MOUTH  DAILY (Patient not taking: Reported on 08/13/2019) 90 tablet 1  . Lancets MISC Test blood sugar once daily 100 each 11  . losartan (COZAAR) 25 MG tablet TAKE 1 TABLET BY MOUTH DAILY TO PROTECT KIDNEYS 90 tablet 1  . metFORMIN (GLUCOPHAGE-XR) 500 MG 24 hr tablet TAKE 2 TABLETS(1000 MG) BY MOUTH TWICE DAILY WITH A MEAL 360 tablet 1  . naproxen (EC-NAPROSYN) 500 MG EC tablet Take 1 tablet (500 mg total) by mouth 2 (two) times daily with a meal. (Patient not taking: Reported on 10/13/2018) 60 tablet 1  . phentermine (ADIPEX-P) 37.5 MG tablet Take 1/2 to 1 tablet every morning for dieting & weightloss (Patient not taking: Reported on 08/13/2019) 30 tablet 5  . rosuvastatin (CRESTOR) 40 MG tablet Takes 1 tablet everyday 90 tablet 1  . Semaglutide,0.25 or 0.5MG /DOS, (OZEMPIC, 0.25 OR 0.5 MG/DOSE,) 2 MG/1.5ML SOPN Inject 0.25 mg subcutaneously once weekly into abdomen for 4 weeks. Then increase to 0.5 mg weekly. 3 pen 0  . sertraline (ZOLOFT) 100 MG tablet TAKE 2 TABLET EVERY DAY FOR MOOD. 60 tablet 2  . terbinafine (LAMISIL) 250 MG tablet Take 1 Tablet Daily for 1 month, then stop for 1 month. Repeat for 3 cycles 90 tablet 0  . tiZANidine (ZANAFLEX) 4 MG capsule TAKE 1 CAPSULE(4 MG) BY MOUTH THREE TIMES DAILY AS NEEDED FOR MUSCLE SPASMS 60 capsule 0  . topiramate (TOPAMAX) 25 MG tablet TAKE 1 TABLET(25 MG) BY MOUTH AT BEDTIME FOR WEIGHT LOSS (Patient not taking: Reported on 08/13/2019) 90 tablet 1   No current facility-administered medications on file prior to visit.   Allergies:  Allergies  Allergen Reactions  . Atorvastatin     Myalgias  . Buspirone     "felt like I was going to pass out"    Medical History:  She has Type 2 diabetes mellitus with hyperglycemia, without long-term current use of insulin (HCC); Hypertension; Hyperlipidemia associated with type 2 diabetes mellitus (HCC); Morbid obesity (HCC); Major depression in partial remission (HCC); Former smoker; Onychomycosis; Chronic venous  insufficiency; Vitamin D deficiency; CKD stage 2 due to type 2 diabetes mellitus (HCC); and Diabetic peripheral neuropathy associated with type 2 diabetes mellitus (HCC) on their problem list. Health Maintenance:   Immunization History  Administered Date(s) Administered  . Influenza,inj,Quad PF,6+ Mos 12/28/2016  . Tdap 10/26/2016    Tetanus: 2018 Pneumovax:  DUE - declines Flu vaccine: 2018, declines  Shingrix:  Covid 19: ***  LMP: No LMP recorded. Patient is postmenopausal. Pap: 2017? Has GYN - Dr. Theresa Mulligan - [facility] - will schedule MGM: 2017 - by Dr. Rosemary Holms,  has next scheduled DEXA: -   Colonoscopy: DUE - cologuard was ordered 01/2018 never completed *** EGD: n/a  Last Dental Exam: [facility], last exam 2020, has dental surgery Last Eye Exam: optometrist this year, needs to see opthal   Patient Care Team: Lucky Cowboy, MD as PCP - General (Internal Medicine)  Surgical History:  She has a past surgical history that includes Cholecystectomy (2005) and Cesarean section (2000). Family History:  Herfamily history includes Renal Disease in her son. She was adopted. Social History:  She reports that she quit smoking about 2 years ago. Her smoking use included cigarettes. She has a 32.00 pack-year smoking history. She has never used smokeless tobacco. She reports current alcohol use. She reports that she does not use drugs. ***  Review of Systems: Review of Systems  Constitutional: Negative for malaise/fatigue and weight loss.  HENT: Negative for hearing loss and tinnitus.   Eyes: Negative for blurred vision and double vision.  Respiratory: Negative for cough, shortness of breath and wheezing.   Cardiovascular: Positive for leg swelling (chronic, bilateral). Negative for chest pain, palpitations, orthopnea and claudication.  Gastrointestinal: Negative for abdominal pain, blood in stool, constipation, diarrhea, heartburn, melena, nausea and vomiting.  Genitourinary:  Negative.   Musculoskeletal: Negative for joint pain and myalgias.  Skin: Negative for rash.  Neurological: Negative for dizziness, tingling, sensory change, weakness and headaches.  Endo/Heme/Allergies: Negative for polydipsia.  Psychiatric/Behavioral: Positive for depression. Negative for hallucinations, substance abuse and suicidal ideas. The patient is not nervous/anxious and does not have insomnia.   All other systems reviewed and are negative.   Physical Exam: Estimated body mass index is 36.51 kg/m as calculated from the following:   Height as of 11/17/18: 5' 7.5" (1.715 m).   Weight as of 08/13/19: 236 lb 9.6 oz (107.3 kg). There were no vitals taken for this visit. General Appearance: Well nourished, in no apparent distress.  Eyes: PERRLA, EOMs, conjunctiva no swelling or erythema, normal fundi and vessels. *** Sinuses: No Frontal/maxillary tenderness  ENT/Mouth: Ext aud canals clear, normal light reflex with TMs without erythema, bulging. Good dentition. No erythema, swelling, or exudate on post pharynx. Tonsils not swollen or erythematous. Hearing normal.  Neck: Supple, thyroid normal. No bruits  Respiratory: Respiratory effort normal, BS equal bilaterally without rales, rhonchi, wheezing or stridor.  Cardio: RRR without murmurs, rubs or gallops. Brisk peripheral pulses with 1+ bilateral pitting edema.  Chest: symmetric, with normal excursions and percussion.  Breasts: Defer to GYN *** Abdomen: Soft, nontender, no guarding, rebound, hernias, masses, or organomegaly.  Lymphatics: Non tender without lymphadenopathy.  Genitourinary: Defer to GYN Musculoskeletal: Full ROM all peripheral extremities, 5/5 strength, and normal gait.  Skin: Warm, dry without rashes, lesions, ecchymosis. Bilateral toe nails yellowed, brittle distally.  Neuro: Cranial nerves intact, reflexes equal bilaterally. Normal muscle tone, no cerebellar symptoms. Sensation intact.  Psych: Awake and oriented X 3,  normal affect, Insight and Judgment appropriate.   EKG: NSR, No ST changes  Carlyon Shadow Amara Justen 2:03 PM [facility]

## 2020-01-14 ENCOUNTER — Encounter: Payer: BC Managed Care – PPO | Admitting: Adult Health

## 2020-02-01 ENCOUNTER — Encounter: Payer: BC Managed Care – PPO | Admitting: Adult Health

## 2020-02-24 ENCOUNTER — Ambulatory Visit (INDEPENDENT_AMBULATORY_CARE_PROVIDER_SITE_OTHER): Payer: BC Managed Care – PPO | Admitting: Adult Health

## 2020-02-24 ENCOUNTER — Other Ambulatory Visit: Payer: Self-pay

## 2020-02-24 ENCOUNTER — Encounter: Payer: Self-pay | Admitting: Adult Health

## 2020-02-24 VITALS — BP 130/70 | HR 89 | Temp 96.3°F | Ht 67.5 in | Wt 212.8 lb

## 2020-02-24 DIAGNOSIS — Z1389 Encounter for screening for other disorder: Secondary | ICD-10-CM | POA: Diagnosis not present

## 2020-02-24 DIAGNOSIS — E785 Hyperlipidemia, unspecified: Secondary | ICD-10-CM

## 2020-02-24 DIAGNOSIS — Z0000 Encounter for general adult medical examination without abnormal findings: Secondary | ICD-10-CM

## 2020-02-24 DIAGNOSIS — F17200 Nicotine dependence, unspecified, uncomplicated: Secondary | ICD-10-CM

## 2020-02-24 DIAGNOSIS — I10 Essential (primary) hypertension: Secondary | ICD-10-CM | POA: Diagnosis not present

## 2020-02-24 DIAGNOSIS — E1122 Type 2 diabetes mellitus with diabetic chronic kidney disease: Secondary | ICD-10-CM

## 2020-02-24 DIAGNOSIS — F324 Major depressive disorder, single episode, in partial remission: Secondary | ICD-10-CM

## 2020-02-24 DIAGNOSIS — L608 Other nail disorders: Secondary | ICD-10-CM

## 2020-02-24 DIAGNOSIS — Z1329 Encounter for screening for other suspected endocrine disorder: Secondary | ICD-10-CM | POA: Diagnosis not present

## 2020-02-24 DIAGNOSIS — Z0001 Encounter for general adult medical examination with abnormal findings: Secondary | ICD-10-CM

## 2020-02-24 DIAGNOSIS — E1165 Type 2 diabetes mellitus with hyperglycemia: Secondary | ICD-10-CM

## 2020-02-24 DIAGNOSIS — E559 Vitamin D deficiency, unspecified: Secondary | ICD-10-CM

## 2020-02-24 DIAGNOSIS — Z13220 Encounter for screening for lipoid disorders: Secondary | ICD-10-CM

## 2020-02-24 DIAGNOSIS — Z136 Encounter for screening for cardiovascular disorders: Secondary | ICD-10-CM

## 2020-02-24 DIAGNOSIS — M26609 Unspecified temporomandibular joint disorder, unspecified side: Secondary | ICD-10-CM | POA: Insufficient documentation

## 2020-02-24 DIAGNOSIS — Z79899 Other long term (current) drug therapy: Secondary | ICD-10-CM | POA: Diagnosis not present

## 2020-02-24 DIAGNOSIS — N182 Chronic kidney disease, stage 2 (mild): Secondary | ICD-10-CM

## 2020-02-24 DIAGNOSIS — I872 Venous insufficiency (chronic) (peripheral): Secondary | ICD-10-CM

## 2020-02-24 DIAGNOSIS — Z131 Encounter for screening for diabetes mellitus: Secondary | ICD-10-CM | POA: Diagnosis not present

## 2020-02-24 DIAGNOSIS — E1142 Type 2 diabetes mellitus with diabetic polyneuropathy: Secondary | ICD-10-CM

## 2020-02-24 DIAGNOSIS — Z87891 Personal history of nicotine dependence: Secondary | ICD-10-CM | POA: Insufficient documentation

## 2020-02-24 DIAGNOSIS — E1169 Type 2 diabetes mellitus with other specified complication: Secondary | ICD-10-CM

## 2020-02-24 MED ORDER — TRAZODONE HCL 50 MG PO TABS: ORAL_TABLET | ORAL | 0 refills | Status: DC

## 2020-02-24 MED ORDER — LANCETS MISC: 11 refills | Status: DC

## 2020-02-24 MED ORDER — GLUCOSE BLOOD VI STRP: ORAL_STRIP | 12 refills | Status: DC

## 2020-02-24 MED ORDER — BLOOD GLUCOSE MONITORING SUPPL DEVI
0 refills | Status: DC
Start: 2020-02-24 — End: 2021-02-23

## 2020-02-24 MED ORDER — ROSUVASTATIN CALCIUM 40 MG PO TABS: ORAL_TABLET | ORAL | 1 refills | Status: DC

## 2020-02-24 MED ORDER — OZEMPIC (0.25 OR 0.5 MG/DOSE) 2 MG/1.5ML ~~LOC~~ SOPN: PEN_INJECTOR | SUBCUTANEOUS | 0 refills | Status: DC

## 2020-02-24 NOTE — Progress Notes (Signed)
Complete Physical  Assessment and Plan:  Encounter for routine medical examination with abnormal findings GYN appointment with Dr. Rosemary Holms, and diabetic eye exam with ophthalmology Check insurance coverage for shingrix  Type 2 diabetes mellitus with hyperglycemia, without long-term current use of insulin (HCC) Currently on metformin 2000 mg daily Off of jardiance and amaryl After discussion prefers to initiate ozempic for weight loss benefits Education: Reviewed 'ABCs' of diabetes management (respective goals in parentheses):  A1C (<7), blood pressure (<130/80), and cholesterol (LDL <70) Eye Exam yearly and Dental Exam every 6 months- requested to forward diabetic eye exam report Dietary recommendations Physical Activity recommendations -     COMPLETE METABOLIC PANEL WITH GFR -     Hemoglobin A1c -     Semaglutide,0.25 or 0.5MG /DOS, (OZEMPIC, 0.25 OR 0.5 MG/DOSE,) 2 MG/1.5ML SOPN; Inject 0.25 mg subcutaneously once weekly into abdomen for 4 weeks. Then increase to 0.5 mg weekly.  Hyperlipidemia associated with type 2 diabetes mellitus (HCC) Restart rosuvastatin 40 mg daily LDL goal <70 Continue low cholesterol diet and exercise.  Check lipid panel.  -     Lipid panel -     TSH  Obesity (BMI 30.0-34.9) Long discussion about weight loss, diet, and exercise Recommended diet heavy in fruits and veggies and low in animal meats, cheeses, and dairy products, appropriate calorie intake Discussed appropriate weight for height and initial goal (200 lb) Follow up at next visit -     Semaglutide,0.25 or 0.5MG /DOS, (OZEMPIC, 0.25 OR 0.5 MG/DOSE,) 2 MG/1.5ML SOPN; Inject 0.25 mg subcutaneously once weekly into abdomen for 4 weeks. Then increase to 0.5 mg weekly.  Major depressive disorder in partial remission, unspecified whether recurrent (HCC) Off of medications; having related insomnia; discussed trazodone Lifestyle discussed: diet/exerise, sleep hygiene, stress management, hydration -      traZODone (DESYREL) 50 MG tablet; 1/2-2 tablet for sleep  Hypertension Currently fairly controlled off of meds; plan to restart ARB if trending up for renal protection; declines at this time Monitor blood pressure at home; call if consistently over 130/80 Continue DASH diet.   Reminder to go to the ER if any CP, SOB, nausea, dizziness, severe HA, changes vision/speech, left arm numbness and tingling and jaw pain.  Medication management -     CBC with Differential/Platelet -     COMPLETE METABOLIC PANEL WITH GFR -     Magnesium  Current intermittent smoker 33+ year smoking hx;  Discussed risks associated with tobacco use and advised to reduce or quit Patient is ready to do so and plans to taper slowly Will follow up at the next visit Start low dose CT scan at age 46  Thickened nails with discoloration of both feet Has failed lamisil x 2; plan to refer to podiatry, but declines at this time   Edema/venous insufficiency - elevate legs TID, increase activity, increase water, decrease sodium intake.  Wear compression socks more routinely if available. Return to the office if no change with symptoms. Check labs.  Colon cancer screening Colonoscopy- patient declines a colonoscopy even though the risks and benefits were discussed at length. Colon cancer is 3rd most diagnosed cancer and 2nd leading cause of death in both men and women 57 years of age and older. Patient understands the risk of cancer and death with declining the test however they are willing to do cologuard screening instead. They understand that this is not as sensitive or specific as a colonoscopy and they are still recommended to get a colonoscopy. The cologuard will  be sent out to their house.   Orders Placed This Encounter  Procedures  . CBC with Differential/Platelet  . COMPLETE METABOLIC PANEL WITH GFR  . Magnesium  . Lipid panel  . TSH  . Hemoglobin A1c  . VITAMIN D 25 Hydroxy (Vit-D Deficiency, Fractures)  .  Microalbumin / creatinine urine ratio  . Urinalysis, Routine w reflex microscopic  . EKG 12-Lead  . HM DIABETES FOOT EXAM   Discussed med's effects and SE's. Screening labs and tests as requested with regular follow-up as recommended. Over 40 minutes of exam, counseling, chart review, and complex, high level critical decision making was performed this visit.   Future Appointments  Date Time Provider Department Center  05/25/2020  2:30 PM Judd Gaudier, NP [facility] None  02/23/2021  3:00 PM Judd Gaudier, NP [facility] None    HPI  55 y.o. female  presents for CPE. She has Type 2 diabetes mellitus with hyperglycemia, without long-term current use of insulin (HCC); Hypertension; Hyperlipidemia associated with type 2 diabetes mellitus (HCC); Morbid obesity (HCC); Major depression in partial remission (HCC); Former smoker; Onychomycosis; Chronic venous insufficiency; Vitamin D deficiency; CKD stage 2 due to type 2 diabetes mellitus (HCC); Diabetic peripheral neuropathy associated with type 2 diabetes mellitus (HCC); TMJ (temporomandibular joint disorder); and Current smoker on their problem list.   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 She was adopted. She is married with 1 living child (lost son to rare renal disease). Husband recently progressed to ESRD, has new port, pending on medicare.   She follows with GYN Dr. Rosemary Holms annually for PAP and mammogram, has scheduled in May.   She does have hx of recurrent major depression; previously controlled on zoloft, wellbutrin 300 mg was added more recently and was doing well but stopped all meds sometime in the last 6 months. Mostly having difficulty sleeping. Wants to minimize meds.  She does have some intermittent mid back pain, was seeing chiropractic, takes tizanidine 4 mg at night PRN, also takes rare ibuprofen.   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   She is an intermittent smoker with 30+ pack year history, back to smoking intermittently with increased stress. She had normal CXR 2019. Denies respiratory sx.   BMI is Body mass index is 32.84 kg/m., she has been working on diet, exercise is limited. 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 Has had limited benefit with phentermine/topamax/welbutrin. Interested in semaglutide -  Wt Readings from Last 3 Encounters:  02/24/20 212 lb 12.8 oz (96.5 kg)  08/13/19 (!) 236 lb 9.6 oz (107.3 kg)  11/17/18 233 lb 12.8 oz (106.1 kg)   She has not been checking BPs at home, reports was been normal at dentist, Today their BP is BP: 130/70  She does not workout. She denies chest pain, shortness of breath, dizziness.   She is on cholesterol medication (didn't tolerate atorvastatin, pravastatin, had stiffness and joint achiness, was tolerating rosuvastatin 40 mg daily but has been off). Her cholesterol is not at goal. The cholesterol last visit was:   Lab Results  Component Value Date   CHOL 259 (H) 08/13/2019   HDL 60 08/13/2019   LDLCALC 152 (H) 08/13/2019   TRIG 287 (H) 08/13/2019   CHOLHDL 4.3 08/13/2019   She has been working on diet and exercise for T2DM (on metformin ER 1000 mg BID, planning  to start ozempic), she is on bASA, she is not on ACE/ARB, she is not on statin due to intolerance and denies foot ulcerations, increased appetite, nausea, paresthesia of the feet, polydipsia, polyuria, visual disturbances, vomiting and weight loss.  She doesn't currently have a meter Last A1C in the office was:  Lab Results  Component Value Date   HGBA1C 10.8 (H) 08/13/2019   Last GFR: Lab Results  Component Value Date   GFRNONAA 89 08/13/2019   Patient is not currently on Vitamin D supplement, unsure of dose. Will check levels today  Lab Results  Component Value Date   VD25OH 30 05/26/2018      Current Medications:  Current  Outpatient Medications on File Prior to Visit  Medication Sig Dispense Refill  . aspirin EC 81 MG tablet Take 81 mg by mouth daily.    Marland Kitchen glucose blood (FREESTYLE LITE) test strip Test blood sugar once daily 100 each 11  . Lancets MISC Test blood sugar once daily 100 each 11  . metFORMIN (GLUCOPHAGE-XR) 500 MG 24 hr tablet TAKE 2 TABLETS(1000 MG) BY MOUTH TWICE DAILY WITH A MEAL 360 tablet 1  . tiZANidine (ZANAFLEX) 4 MG capsule TAKE 1 CAPSULE(4 MG) BY MOUTH THREE TIMES DAILY AS NEEDED FOR MUSCLE SPASMS (Patient not taking: Reported on 02/24/2020) 60 capsule 0   No current facility-administered medications on file prior to visit.   Allergies:  Allergies  Allergen Reactions  . Atorvastatin     Myalgias  . Buspirone     "felt like I was going to pass out"    Medical History:  She has Type 2 diabetes mellitus with hyperglycemia, without long-term current use of insulin (HCC); Hypertension; Hyperlipidemia associated with type 2 diabetes mellitus (HCC); Morbid obesity (HCC); Major depression in partial remission (HCC); Former smoker; Onychomycosis; Chronic venous insufficiency; Vitamin D deficiency; CKD stage 2 due to type 2 diabetes mellitus (HCC); Diabetic peripheral neuropathy associated with type 2 diabetes mellitus (HCC); TMJ (temporomandibular joint disorder); and Current smoker on their problem list.   Health Maintenance:   Immunization History  Administered Date(s) Administered  . Influenza,inj,Quad PF,6+ Mos 12/28/2016  . Tdap 10/26/2016    Tetanus: 2018 Pneumovax:  DUE - declines Prevnar 13: - Flu vaccine: 2018, declines  Zostavax: - Covid 19: declines   LMP: No LMP recorded. Patient is postmenopausal. Pap: 2017? Has GYN - Dr. Theresa Mulligan - [facility] - has schedued in May  MGM: 2017 - by Dr. Rosemary Holms, has next scheduled DEXA: -   Colonoscopy: DUE - cologuard was ordered but never completed  EGD: n/a  Last Dental Exam: [facility], last exam 2022 2 weeks ago  Last Eye Exam:  last 2021, scheduled 03/10/2020 - report reqeusted  Patient Care Team: Lucky Cowboy, MD as PCP - General (Internal Medicine)  Surgical History:  She has a past surgical history that includes Cholecystectomy (2005) and Cesarean section (2000). Family History:  Herfamily history includes Renal Disease in her son. She was adopted. Social History:  She reports that she has been smoking cigarettes. She started smoking about 39 years ago. She has a 33.00 pack-year smoking history. She has never used smokeless tobacco. She reports current alcohol use. She reports that she does not use drugs.   Review of Systems: Review of Systems  Constitutional: Negative for malaise/fatigue and weight loss.  HENT: Negative for hearing loss and tinnitus.   Eyes: Negative for blurred vision and double vision.  Respiratory: Negative for cough, shortness of breath and wheezing.  Cardiovascular: Negative for chest pain, palpitations, orthopnea, claudication and leg swelling (chronic, mild, bil).  Gastrointestinal: Negative for abdominal pain, blood in stool, constipation, diarrhea, heartburn, melena, nausea and vomiting.  Genitourinary: Negative.   Musculoskeletal: Positive for back pain (lumbar, ortho following). Negative for joint pain and myalgias.  Skin: Negative for rash.  Neurological: Positive for tingling (bil feet). Negative for dizziness, sensory change, weakness and headaches.  Endo/Heme/Allergies: Negative for polydipsia.  Psychiatric/Behavioral: Negative for depression, hallucinations, substance abuse and suicidal ideas. The patient has insomnia. The patient is not nervous/anxious.   All other systems reviewed and are negative.   Physical Exam: Estimated body mass index is 32.84 kg/m as calculated from the following:   Height as of this encounter: 5' 7.5" (1.715 m).   Weight as of this encounter: 212 lb 12.8 oz (96.5 kg). BP 130/70   Pulse 89   Temp (!) 96.3 F (35.7 C)   Ht 5' 7.5"  (1.715 m)   Wt 212 lb 12.8 oz (96.5 kg)   SpO2 97%   BMI 32.84 kg/m  General Appearance: Well nourished, appears older than stated age, in no apparent distress.  Eyes: PERRLA, EOMs, conjunctiva no swelling or erythema Sinuses: No Frontal/maxillary tenderness  ENT/Mouth: Ext aud canals clear, normal light reflex with TMs without erythema, bulging. Good dentition. No erythema, swelling, or exudate on post pharynx. Tonsils not swollen or erythematous. Hearing normal. Missing extensive teeth.  Neck: Supple, thyroid normal. No bruits  Respiratory: Respiratory effort normal, BS equal bilaterally without rales, rhonchi, wheezing or stridor.  Cardio: RRR without murmurs, rubs or gallops. Brisk peripheral pulses with 1+ bilateral non-pitting edema to ankles.  Chest: symmetric, with normal excursions and percussion.  Breasts: Defer to GYN Abdomen: Soft, nontender, no guarding, rebound, hernias, masses, or organomegaly.  Lymphatics: Non tender without lymphadenopathy.  Genitourinary: Defer to GYN Musculoskeletal: Full ROM all peripheral extremities, 5/5 strength, and normal gait.  Skin: Warm, dry without rashes, lesions, ecchymosis. Bilateral toe nails yellowed, brittle distally.  Neuro: Cranial nerves intact, reflexes equal bilaterally. Normal muscle tone, no cerebellar symptoms. Sensation intact to monofilament bilaterally.  Psych: Awake and oriented X 3, normal affect, Insight and Judgment appropriate.   EKG: NSR, No ST changes  Carlyon Shadow Alyvia Derk 5:47 PM [facility]

## 2020-02-24 NOTE — Patient Instructions (Addendum)
Danielle Mccall , Thank you for taking time to come for your Medicare Wellness Visit. I appreciate your ongoing commitment to your health goals. Please review the following plan we discussed and let me know if I can assist you in the future.   These are the goals we discussed: Goals    . Blood Pressure < 130/80    . DIET - DECREASE SODA OR JUICE INTAKE    . Exercise 3x per week (30 min per time)    . HEMOGLOBIN A1C < 7.0    . Weight (lb) < 180 lb (81.6 kg)       This is a list of the screening recommended for you and due dates:  Health Maintenance  Topic Date Due  . Eye exam for diabetics  Never done  . Colon Cancer Screening  Never done  . Pap Smear  03/14/2013  . Mammogram  Never done  . Hemoglobin A1C  02/13/2020  . COVID-19 Vaccine (1) 03/11/2020*  . Flu Shot  04/14/2020*  . Pneumococcal vaccine  02/23/2021*  . Complete foot exam   02/23/2021  . Tetanus Vaccine  10/27/2026  .  Hepatitis C: One time screening is recommended by Center for Disease Control  (CDC) for  adults born from [birth year] through 1963.   Completed  . HIV Screening  Completed  *Topic was postponed. The date shown is not the original due date.    Please forward PAP, mammogram, diabetic eye exam  Start ozempic 0.25 mg weekly for 4 weeks - if tolerating well message me for a script, and increase to taking 0.5 mg weekly    Trazodone Tablets What is this medicine? TRAZODONE (TRAZ oh done) is used to treat depression and insomnia This medicine may be used for other purposes; ask your health care provider or pharmacist if you have questions. COMMON BRAND NAME(S): Desyrel What should I tell my health care provider before I take this medicine? They need to know if you have any of these conditions:  attempted suicide or thinking about it  bipolar disorder  bleeding problems  glaucoma  heart disease, or previous heart attack  irregular heart beat  kidney or liver disease  low levels of sodium in the  blood  an unusual or allergic reaction to trazodone, other medicines, foods, dyes or preservatives  pregnant or trying to get pregnant  breast-feeding How should I use this medicine? Take this medicine by mouth with a glass of water. Follow the directions on the prescription label. Take this medicine shortly after a meal or a light snack. Take your medicine at regular intervals. Do not take your medicine more often than directed. Do not stop taking this medicine suddenly except upon the advice of your doctor. Stopping this medicine too quickly may cause serious side effects or your condition may worsen. A special MedGuide will be given to you by the pharmacist with each prescription and refill. Be sure to read this information carefully each time. Talk to your pediatrician regarding the use of this medicine in children. Special care may be needed. Overdosage: If you think you have taken too much of this medicine contact a poison control center or emergency room at once. NOTE: This medicine is only for you. Do not share this medicine with others. What if I miss a dose? If you miss a dose, take it as soon as you can. If it is almost time for your next dose, take only that dose. Do not take double or extra  doses. What may interact with this medicine? Do not take this medicine with any of the following medications:  certain medicines for fungal infections like fluconazole, itraconazole, ketoconazole, posaconazole, voriconazole  cisapride  dronedarone  linezolid  MAOIs like Carbex, Eldepryl, Marplan, Nardil, and Parnate  mesoridazine  methylene blue (injected into a vein)  pimozide  saquinavir  thioridazine This medicine may also interact with the following medications:  alcohol  antiviral medicines for HIV or AIDS  aspirin and aspirin-like medicines  barbiturates like phenobarbital  certain medicines for blood pressure, heart disease, irregular heart beat  certain  medicines for depression, anxiety, or psychotic disturbances  certain medicines for migraine headache like almotriptan, eletriptan, frovatriptan, naratriptan, rizatriptan, sumatriptan, zolmitriptan  certain medicines for seizures like carbamazepine and phenytoin  certain medicines for sleep  certain medicines that treat or prevent blood clots like dalteparin, enoxaparin, warfarin  digoxin  fentanyl  lithium  NSAIDS, medicines for pain and inflammation, like ibuprofen or naproxen  other medicines that prolong the QT interval (cause an abnormal heart rhythm) like dofetilide  rasagiline  supplements like St. John's wort, kava kava, valerian  tramadol  tryptophan This list may not describe all possible interactions. Give your health care provider a list of all the medicines, herbs, non-prescription drugs, or dietary supplements you use. Also tell them if you smoke, drink alcohol, or use illegal drugs. Some items may interact with your medicine. What should I watch for while using this medicine? Tell your doctor if your symptoms do not get better or if they get worse. Visit your doctor or health care professional for regular checks on your progress. Because it may take several weeks to see the full effects of this medicine, it is important to continue your treatment as prescribed by your doctor. Patients and their families should watch out for new or worsening thoughts of suicide or depression. Also watch out for sudden changes in feelings such as feeling anxious, agitated, panicky, irritable, hostile, aggressive, impulsive, severely restless, overly excited and hyperactive, or not being able to sleep. If this happens, especially at the beginning of treatment or after a change in dose, call your health care professional. Bonita Quin may get drowsy or dizzy. Do not drive, use machinery, or do anything that needs mental alertness until you know how this medicine affects you. Do not stand or sit up  quickly, especially if you are an older patient. This reduces the risk of dizzy or fainting spells. Alcohol may interfere with the effect of this medicine. Avoid alcoholic drinks. This medicine may cause dry eyes and blurred vision. If you wear contact lenses you may feel some discomfort. Lubricating drops may help. See your eye doctor if the problem does not go away or is severe. Your mouth may get dry. Chewing sugarless gum, sucking hard candy and drinking plenty of water may help. Contact your doctor if the problem does not go away or is severe. What side effects may I notice from receiving this medicine? Side effects that you should report to your doctor or health care professional as soon as possible:  allergic reactions like skin rash, itching or hives, swelling of the face, lips, or tongue  elevated mood, decreased need for sleep, racing thoughts, impulsive behavior  confusion  fast, irregular heartbeat  feeling faint or lightheaded, falls  feeling agitated, angry, or irritable  loss of balance or coordination  painful or prolonged erections  restlessness, pacing, inability to keep still  suicidal thoughts or other mood changes  tremors  trouble sleeping  seizures  unusual bleeding or bruising Side effects that usually do not require medical attention (report to your doctor or health care professional if they continue or are bothersome):  change in sex drive or performance  change in appetite or weight  constipation  headache  muscle aches or pains  nausea This list may not describe all possible side effects. Call your doctor for medical advice about side effects. You may report side effects to FDA at 1-800-FDA-1088. Where should I keep my medicine? Keep out of the reach of children. Store at room temperature between 15 and 30 degrees C (59 to 86 degrees F). Protect from light. Keep container tightly closed. Throw away any unused medicine after the expiration  date. NOTE: This sheet is a summary. It may not cover all possible information. If you have questions about this medicine, talk to your doctor, pharmacist, or health care provider.  2021 Elsevier/Gold Standard (2019-11-23 14:46:11)       Semaglutide injection solution What is this medicine? SEMAGLUTIDE (Sem a GLOO tide) is used to improve blood sugar control in adults with type 2 diabetes. This medicine may be used with other diabetes medicines. This drug may also reduce the risk of heart attack or stroke if you have type 2 diabetes and risk factors for heart disease. This medicine may be used for other purposes; ask your health care provider or pharmacist if you have questions. COMMON BRAND NAME(S): OZEMPIC What should I tell my health care provider before I take this medicine? They need to know if you have any of these conditions:  endocrine tumors (MEN 2) or if someone in your family had these tumors  eye disease, vision problems  history of pancreatitis  kidney disease  stomach problems  thyroid cancer or if someone in your family had thyroid cancer  an unusual or allergic reaction to semaglutide, other medicines, foods, dyes, or preservatives  pregnant or trying to get pregnant  breast-feeding How should I use this medicine? This medicine is for injection under the skin of your upper leg (thigh), stomach area, or upper arm. It is given once every week (every 7 days). You will be taught how to prepare and give this medicine. Use exactly as directed. Take your medicine at regular intervals. Do not take it more often than directed. If you use this medicine with insulin, you should inject this medicine and the insulin separately. Do not mix them together. Do not give the injections right next to each other. Change (rotate) injection sites with each injection. It is important that you put your used needles and syringes in a special sharps container. Do not put them in a trash  can. If you do not have a sharps container, call your pharmacist or healthcare provider to get one. A special MedGuide will be given to you by the pharmacist with each prescription and refill. Be sure to read this information carefully each time. This drug comes with INSTRUCTIONS FOR USE. Ask your pharmacist for directions on how to use this drug. Read the information carefully. Talk to your pharmacist or health care provider if you have questions. Talk to your pediatrician regarding the use of this medicine in children. Special care may be needed. Overdosage: If you think you have taken too much of this medicine contact a poison control center or emergency room at once. NOTE: This medicine is only for you. Do not share this medicine with others. What if I miss a dose? If you  miss a dose, take it as soon as you can within 5 days after the missed dose. Then take your next dose at your regular weekly time. If it has been longer than 5 days after the missed dose, do not take the missed dose. Take the next dose at your regular time. Do not take double or extra doses. If you have questions about a missed dose, contact your health care provider for advice. What may interact with this medicine?  other medicines for diabetes Many medications may cause changes in blood sugar, these include:  alcohol containing beverages  antiviral medicines for HIV or AIDS  aspirin and aspirin-like drugs  certain medicines for blood pressure, heart disease, irregular heart beat  chromium  diuretics  female hormones, such as estrogens or progestins, birth control pills  fenofibrate  gemfibrozil  isoniazid  lanreotide  female hormones or anabolic steroids  MAOIs like Carbex, Eldepryl, Marplan, Nardil, and Parnate  medicines for weight loss  medicines for allergies, asthma, cold, or cough  medicines for depression, anxiety, or psychotic disturbances  niacin  nicotine  NSAIDs, medicines for pain and  inflammation, like ibuprofen or naproxen  octreotide  pasireotide  pentamidine  phenytoin  probenecid  quinolone antibiotics such as ciprofloxacin, levofloxacin, ofloxacin  some herbal dietary supplements  steroid medicines such as prednisone or cortisone  sulfamethoxazole; trimethoprim  thyroid hormones Some medications can hide the warning symptoms of low blood sugar (hypoglycemia). You may need to monitor your blood sugar more closely if you are taking one of these medications. These include:  beta-blockers, often used for high blood pressure or heart problems (examples include atenolol, metoprolol, propranolol)  clonidine  guanethidine  reserpine This list may not describe all possible interactions. Give your health care provider a list of all the medicines, herbs, non-prescription drugs, or dietary supplements you use. Also tell them if you smoke, drink alcohol, or use illegal drugs. Some items may interact with your medicine. What should I watch for while using this medicine? Visit your doctor or health care professional for regular checks on your progress. Drink plenty of fluids while taking this medicine. Check with your doctor or health care professional if you get an attack of severe diarrhea, nausea, and vomiting. The loss of too much body fluid can make it dangerous for you to take this medicine. A test called the HbA1C (A1C) will be monitored. This is a simple blood test. It measures your blood sugar control over the last 2 to 3 months. You will receive this test every 3 to 6 months. Learn how to check your blood sugar. Learn the symptoms of low and high blood sugar and how to manage them. Always carry a quick-source of sugar with you in case you have symptoms of low blood sugar. Examples include hard sugar candy or glucose tablets. Make sure others know that you can choke if you eat or drink when you develop serious symptoms of low blood sugar, such as seizures or  unconsciousness. They must get medical help at once. Tell your doctor or health care professional if you have high blood sugar. You might need to change the dose of your medicine. If you are sick or exercising more than usual, you might need to change the dose of your medicine. Do not skip meals. Ask your doctor or health care professional if you should avoid alcohol. Many nonprescription cough and cold products contain sugar or alcohol. These can affect blood sugar. Pens should never be shared. Even if  the needle is changed, sharing may result in passing of viruses like hepatitis or HIV. Wear a medical ID bracelet or chain, and carry a card that describes your disease and details of your medicine and dosage times. Do not become pregnant while taking this medicine. Women should inform their doctor if they wish to become pregnant or think they might be pregnant. There is a potential for serious side effects to an unborn child. Talk to your health care professional or pharmacist for more information. What side effects may I notice from receiving this medicine? Side effects that you should report to your doctor or health care professional as soon as possible:  allergic reactions like skin rash, itching or hives, swelling of the face, lips, or tongue  breathing problems  changes in vision  diarrhea that continues or is severe  lump or swelling on the neck  severe nausea  signs and symptoms of infection like fever or chills; cough; sore throat; pain or trouble passing urine  signs and symptoms of low blood sugar such as feeling anxious, confusion, dizziness, increased hunger, unusually weak or tired, sweating, shakiness, cold, irritable, headache, blurred vision, fast heartbeat, loss of consciousness  signs and symptoms of kidney injury like trouble passing urine or change in the amount of urine  trouble swallowing  unusual stomach upset or pain  vomiting Side effects that usually do not  require medical attention (report to your doctor or health care professional if they continue or are bothersome):  constipation  diarrhea  nausea  pain, redness, or irritation at site where injected  stomach upset This list may not describe all possible side effects. Call your doctor for medical advice about side effects. You may report side effects to FDA at 1-800-FDA-1088. Where should I keep my medicine? Keep out of the reach of children. Store unopened pens in a refrigerator between 2 and 8 degrees C (36 and 46 degrees F). Do not freeze. Protect from light and heat. After you first use the pen, it can be stored for 56 days at room temperature between 15 and 30 degrees C (59 and 86 degrees F) or in a refrigerator. Throw away your used pen after 56 days or after the expiration date, whichever comes first. Do not store your pen with the needle attached. If the needle is left on, medicine may leak from the pen. NOTE: This sheet is a summary. It may not cover all possible information. If you have questions about this medicine, talk to your doctor, pharmacist, or health care provider.  2021 Elsevier/Gold Standard (2018-09-16 09:41:51)

## 2020-02-25 ENCOUNTER — Other Ambulatory Visit: Payer: Self-pay | Admitting: Adult Health

## 2020-02-25 DIAGNOSIS — E1165 Type 2 diabetes mellitus with hyperglycemia: Secondary | ICD-10-CM

## 2020-02-25 LAB — COMPLETE METABOLIC PANEL WITH GFR
AG Ratio: 1.4 (calc) (ref 1.0–2.5)
ALT: 14 U/L (ref 6–29)
AST: 12 U/L (ref 10–35)
Albumin: 4.1 g/dL (ref 3.6–5.1)
Alkaline phosphatase (APISO): 74 U/L (ref 37–153)
BUN: 24 mg/dL (ref 7–25)
CO2: 29 mmol/L (ref 20–32)
Calcium: 9.8 mg/dL (ref 8.6–10.4)
Chloride: 97 mmol/L — ABNORMAL LOW (ref 98–110)
Creat: 0.93 mg/dL (ref 0.50–1.05)
GFR, Est African American: 82 mL/min/{1.73_m2} (ref >=60)
GFR, Est Non African American: 71 mL/min/{1.73_m2} (ref >=60)
Globulin: 2.9 g/dL (calc) (ref 1.9–3.7)
Glucose, Bld: 311 mg/dL — ABNORMAL HIGH (ref 65–99)
Potassium: 5.2 mmol/L (ref 3.5–5.3)
Sodium: 134 mmol/L — ABNORMAL LOW (ref 135–146)
Total Bilirubin: 0.5 mg/dL (ref 0.2–1.2)
Total Protein: 7 g/dL (ref 6.1–8.1)

## 2020-02-25 LAB — CBC WITH DIFFERENTIAL/PLATELET
Absolute Monocytes: 704 cells/uL (ref 200–950)
Basophils Absolute: 95 cells/uL (ref 0–200)
Basophils Relative: 0.9 %
Eosinophils Absolute: 221 cells/uL (ref 15–500)
Eosinophils Relative: 2.1 %
HCT: 41.7 % (ref 35.0–45.0)
Hemoglobin: 13.8 g/dL (ref 11.7–15.5)
Lymphs Abs: 3171 cells/uL (ref 850–3900)
MCH: 28.8 pg (ref 27.0–33.0)
MCHC: 33.1 g/dL (ref 32.0–36.0)
MCV: 86.9 fL (ref 80.0–100.0)
MPV: 10.4 fL (ref 7.5–12.5)
Monocytes Relative: 6.7 %
Neutro Abs: 6311 cells/uL (ref 1500–7800)
Neutrophils Relative %: 60.1 %
Platelets: 396 10*3/uL (ref 140–400)
RBC: 4.8 10*6/uL (ref 3.80–5.10)
RDW: 12.1 % (ref 11.0–15.0)
Total Lymphocyte: 30.2 %
WBC: 10.5 10*3/uL (ref 3.8–10.8)

## 2020-02-25 LAB — MICROALBUMIN / CREATININE URINE RATIO
Creatinine, Urine: 142 mg/dL (ref 20–275)
Microalb Creat Ratio: 13 mcg/mg creat (ref <30)
Microalb, Ur: 1.9 mg/dL

## 2020-02-25 LAB — URINALYSIS, ROUTINE W REFLEX MICROSCOPIC
Bilirubin Urine: NEGATIVE
Hgb urine dipstick: NEGATIVE
Ketones, ur: NEGATIVE
Leukocytes,Ua: NEGATIVE
Nitrite: NEGATIVE
Protein, ur: NEGATIVE
Specific Gravity, Urine: 1.045 — ABNORMAL HIGH (ref 1.001–1.03)
pH: <=5 (ref 5.0–8.0)

## 2020-02-25 LAB — HEMOGLOBIN A1C: Hgb A1c MFr Bld: >14 % of total Hgb — ABNORMAL HIGH (ref <5.7)

## 2020-02-25 LAB — MAGNESIUM: Magnesium: 2 mg/dL (ref 1.5–2.5)

## 2020-02-25 LAB — LIPID PANEL
Cholesterol: 294 mg/dL — ABNORMAL HIGH (ref <200)
HDL: 63 mg/dL (ref >=50)
LDL Cholesterol (Calc): 197 mg/dL (calc) — ABNORMAL HIGH
Non-HDL Cholesterol (Calc): 231 mg/dL (calc) — ABNORMAL HIGH (ref <130)
Total CHOL/HDL Ratio: 4.7 (calc) (ref <5.0)
Triglycerides: 170 mg/dL — ABNORMAL HIGH (ref <150)

## 2020-02-25 LAB — TSH: TSH: 1.72 mIU/L

## 2020-02-25 LAB — VITAMIN D 25 HYDROXY (VIT D DEFICIENCY, FRACTURES): Vit D, 25-Hydroxy: 36 ng/mL (ref 30–100)

## 2020-02-25 MED ORDER — GLIMEPIRIDE 4 MG PO TABS: ORAL_TABLET | ORAL | 1 refills | Status: DC

## 2020-03-02 ENCOUNTER — Encounter: Payer: BC Managed Care – PPO | Admitting: Adult Health

## 2020-03-10 LAB — HM DIABETES EYE EXAM

## 2020-03-14 ENCOUNTER — Encounter: Payer: Self-pay | Admitting: Adult Health Nurse Practitioner

## 2020-03-14 ENCOUNTER — Other Ambulatory Visit: Payer: Self-pay

## 2020-03-14 ENCOUNTER — Ambulatory Visit: Payer: BC Managed Care – PPO | Admitting: Adult Health Nurse Practitioner

## 2020-03-14 VITALS — BP 120/62 | HR 63 | Temp 97.3°F | Wt 207.0 lb

## 2020-03-14 DIAGNOSIS — R059 Cough, unspecified: Secondary | ICD-10-CM

## 2020-03-14 DIAGNOSIS — J324 Chronic pansinusitis: Secondary | ICD-10-CM

## 2020-03-14 MED ORDER — BENZONATATE 200 MG PO CAPS: ORAL_CAPSULE | ORAL | 1 refills | Status: DC

## 2020-03-14 MED ORDER — AZITHROMYCIN 250 MG PO TABS: ORAL_TABLET | ORAL | 1 refills | Status: AC

## 2020-03-14 MED ORDER — PROMETHAZINE-DM 6.25-15 MG/5ML PO SYRP: 5.0000 mL | ORAL_SOLUTION | Freq: Four times a day (QID) | ORAL | 1 refills | Status: DC | PRN

## 2020-03-14 MED ORDER — AZITHROMYCIN 250 MG PO TABS
ORAL_TABLET | ORAL | 1 refills | Status: DC
Start: 2020-03-14 — End: 2020-03-14

## 2020-03-14 NOTE — Progress Notes (Signed)
Assessment and Plan:  Danielle Mccall was seen today for cough, sore throat and nasal congestion.  Diagnoses and all orders for this visit:  Cough --     benzonatate (TESSALON) 200 MG capsule; Take one capsule up to three times a day as needed for cough. -     promethazine-dextromethorphan (PROMETHAZINE-DM) 6.25-15 MG/5ML syrup; Take 5 mLs by mouth 4 (four) times daily as needed for cough.  Pansinusitis, unspecified chronicity  -     azithromycin (ZITHROMAX) 250 MG tablet; Take 2 tablets (500 mg) on  Day 1,  followed by 1 tablet (250 mg) once daily on Days 2 through 5.   Contact office with any new or worsening symptoms   Further disposition pending results of labs. Discussed med's effects and SE's.   Over 30 minutes of face to face exam, counseling, chart review, and critical decision making was performed.   Future Appointments  Date Time Provider Department Center  05/25/2020  2:30 PM Judd Gaudier, NP [facility] None  02/23/2021  3:00 PM Judd Gaudier, NP [facility] None    ------------------------------------------------------------------------------------------------------------------   HPI 55 y.o.female presents for evaluation of symptoms that started four days ago.  It started with sore throat and runny nose. Three days later she started having a cough and feeling drained.  The cough is productive, constantly.  Seems to get worse in the evenings.  The cough is waking her up at night.  She has tried taking benadryl to help with sleep, that was not helpful.  She is having sinus tenderness and pressure. She had a negative home test this AM.  Reports she has not had any known exposure to COVID19.   Past Medical History:  Diagnosis Date  . Diabetes mellitus without complication (HCC)   . Leg pain, left   . 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 Atorvastatin     Myalgias  . Buspirone     "felt like I was going to pass out"     Current Outpatient  Medications on File Prior to Visit  Medication Sig  . aspirin EC 81 MG tablet Take 81 mg by mouth daily.  . Blood Glucose Monitoring Suppl DEVI Test blood sugar once daily Dx:E11.65  . glimepiride (AMARYL) 4 MG tablet Take 1 tab twice daily with meals for diabetes.  Marland Kitchen glucose blood (FREESTYLE LITE) test strip Test blood sugar once daily  . glucose blood test strip Test blood sugar once daily Dx:E11.65  . Lancets MISC Test blood sugar once daily  . Lancets MISC Use to test blood sugar daily. Dx:E11.65  . metFORMIN (GLUCOPHAGE-XR) 500 MG 24 hr tablet TAKE 2 TABLETS(1000 MG) BY MOUTH TWICE DAILY WITH A MEAL  . rosuvastatin (CRESTOR) 40 MG tablet Takes 1 tablet everyday  . Semaglutide,0.25 or 0.5MG /DOS, (OZEMPIC, 0.25 OR 0.5 MG/DOSE,) 2 MG/1.5ML SOPN Inject 0.25 mg subcutaneously once weekly into abdomen for 4 weeks. Then increase to 0.5 mg weekly.  . traZODone (DESYREL) 50 MG tablet 1/2-2 tablet for sleep   No current facility-administered medications on file prior to visit.    ROS: all negative except above.   Physical Exam:  BP 120/62   Pulse 63   Temp (!) 97.3 F (36.3 C)   Wt 207 lb (93.9 kg)   SpO2 99%   BMI 31.94 kg/m   General Appearance: Well nourished, in no apparent distress. Eyes: PERRLA, EOMs, conjunctiva no swelling or erythema Sinuses: Frontal/maxillary tenderness ENT/Mouth: Ext aud canals clear, TMs without erythema,  bulging. No erythema, swelling, or exudate on post pharynx.  Tonsils not swollen or erythematous. Hearing normal.  Neck: Supple, thyroid normal.  Respiratory: Respiratory effort normal, BS equal bilaterally without rales, rhonchi noted, no wheezing or stridor.  Cardio: RRR with no MRGs. Brisk peripheral pulses without edema.  Abdomen: Soft, + BS.  Non tender, no guarding, rebound, hernias, masses. Lymphatics: Non tender without lymphadenopathy.  Musculoskeletal: Full ROM, 5/5 strength, normal gait.  Skin: Warm, dry without rashes, lesions, ecchymosis.   Neuro: Cranial nerves intact. Normal muscle tone, no cerebellar symptoms. Sensation intact.  Psych: Awake and oriented X 3, normal affect, Insight and Judgment appropriate.      Elder Negus, Edrick Oh, DNP [facility] 03/14/2020  4:30 PM

## 2020-03-14 NOTE — Patient Instructions (Addendum)
    Allergy Symptoms / Runny Nose: Chose one  Zyrtec / Cetirizine Take 10mg  by mouth May cause drowsiness, take nightly Be sure to drink plenty of water If this is not effective, try Xyzal or Allegra  OR  Xyzal / Levocetirazine  Take 5mg  by mouth May cause drowsiness, take nightly Be sure to drink plenty of water If this is not effective try Allegra or Zyrtec  OR  Allegra / fexofenadine Take 180mg  by mouth daily If this is not effective try Zyrtec or Xyzal   *If you battle with chronic allergies you may need to change the antihistamine you currently use to find most effective.     Take mucinex DM one tablet every 12 hours while having symptoms.  You can also take the Tessalon pearls.   We sent in three prescriptions for you to the [facility] on [address].

## 2020-03-15 ENCOUNTER — Other Ambulatory Visit: Payer: Self-pay

## 2020-03-15 DIAGNOSIS — M62830 Muscle spasm of back: Secondary | ICD-10-CM

## 2020-03-15 MED ORDER — TIZANIDINE HCL 4 MG PO CAPS: ORAL_CAPSULE | ORAL | 0 refills | Status: DC

## 2020-04-04 ENCOUNTER — Other Ambulatory Visit: Payer: Self-pay

## 2020-04-04 ENCOUNTER — Other Ambulatory Visit: Payer: Self-pay | Admitting: Internal Medicine

## 2020-04-04 DIAGNOSIS — M62830 Muscle spasm of back: Secondary | ICD-10-CM

## 2020-04-04 MED ORDER — OZEMPIC (0.25 OR 0.5 MG/DOSE) 2 MG/1.5ML ~~LOC~~ SOPN: PEN_INJECTOR | SUBCUTANEOUS | 0 refills | Status: DC

## 2020-04-05 ENCOUNTER — Other Ambulatory Visit: Payer: Self-pay | Admitting: Adult Health

## 2020-04-05 DIAGNOSIS — E1165 Type 2 diabetes mellitus with hyperglycemia: Secondary | ICD-10-CM

## 2020-04-05 MED ORDER — METFORMIN HCL ER 500 MG PO TB24: ORAL_TABLET | ORAL | 1 refills | Status: DC

## 2020-05-18 ENCOUNTER — Other Ambulatory Visit: Payer: Self-pay | Admitting: Adult Health

## 2020-05-18 MED ORDER — TRAZODONE HCL 50 MG PO TABS: ORAL_TABLET | ORAL | 3 refills | Status: DC

## 2020-05-19 ENCOUNTER — Encounter: Payer: Self-pay | Admitting: Adult Health

## 2020-05-19 DIAGNOSIS — E11319 Type 2 diabetes mellitus with unspecified diabetic retinopathy without macular edema: Secondary | ICD-10-CM | POA: Insufficient documentation

## 2020-05-24 NOTE — Progress Notes (Signed)
3 MONTH FOLLOW UP  Assessment and Plan:  Type 2 diabetes mellitus with hyperglycemia, without long-term current use of insulin (HCC) Restart metformin 2000 mg daily, increase ozempic to 1 mg/week Hold glimepiride PRN 1/2 tab only  Education: Reviewed 'ABCs' of diabetes management (respective goals in parentheses):  A1C (<7), blood pressure (<130/80), and cholesterol (LDL <70) Dietary recommendations Physical Activity recommendations -     COMPLETE METABOLIC PANEL WITH GFR -     Hemoglobin A1c -     Semaglutide,1.0 MG subcutaneously once weekly into abdomen   Hyperlipidemia associated with type 2 diabetes mellitus (HCC) Back on rosuvastatin 40 mg daily  LDL goal <70 Continue low cholesterol diet and exercise.  Check lipid panel.  -     Lipid panel -     TSH  Obesity (BMI 30.0-34.9) Long discussion about weight loss, diet, and exercise Recommended diet heavy in fruits and veggies and low in animal meats, cheeses, and dairy products, appropriate calorie intake Discussed appropriate weight for height and initial goal (200 lb) Follow up at next visit -     Semaglutide, 1 mg/week injection  Major depressive disorder in partial remission, unspecified whether recurrent (HCC) Trazodone helping- try 100 mg if helpful will send in new tabs Lifestyle discussed: diet/exerise, sleep hygiene, stress management, hydration  Hypertension Currently fairly controlled off of meds; plan to restart ARB if trending up for renal protection; declines at this time Monitor blood pressure at home; call if consistently over 130/80 Continue DASH diet.   Reminder to go to the ER if any CP, SOB, nausea, dizziness, severe HA, changes vision/speech, left arm numbness and tingling and jaw pain.  Medication management -     CBC with Differential/Platelet -     COMPLETE METABOLIC PANEL WITH GFR -     Magnesium  Current intermittent smoker 33+ year smoking hx;  Discussed risks associated with tobacco use and  advised to reduce or quit Patient is ready to do so and plans to taper slowly Will follow up at the next visit Start low dose CT scan at age 55  Discussed med's effects and SE's. Labs and tests as requested with regular follow-up as recommended. Over 30 minutes of exam, counseling, chart review, and complex, high level critical decision making was performed this visit.   Future Appointments  Date Time Provider Department Center  02/23/2021  3:00 PM Judd Gaudierorbett, Myangel Summons, NP [facility] None    HPI  55 y.o. female  presents for 3 month follow up for htn, hyperlipidemia, T2DM, obesity, vitamin D def.    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 Husband recently progressed to ESRD, on medicare and doing dialysis.     She does have hx of recurrent major depression; previously controlled on zoloft, wellbutrin 300 mg was added more recently and was doing well but stopped all meds sometime in the last 6 months, wanting to minimize meds, was also reporting sleep trouble, was started on trazodone 50 mg with benefit.   She does have some intermittent mid back pain, was seeing chiropractic, takes tizanidine 4 mg at night PRN, also takes rare ibuprofen.   She is an intermittent smoker with 30+ pack year history, back to smoking intermittently with increased stress. She had normal CXR 2019. Denies respiratory sx.   BMI is Body mass index is 31.63 kg/m., she has been working on diet, exercise is limited. 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 She is down  from previous 350lb. Has had limited benefit with phentermine/topamax/welbutrin. Initiated on semaglutide last OV, up to 0.5 mg weekly, tolerating well, down 5 lb. She reports has quit soda since last OV.  Wt Readings from Last 3 Encounters:  05/25/20 205 lb (93 kg)  03/14/20 207 lb (93.9 kg)  02/24/20 212 lb 12.8 oz (96.5 kg)   She has not been checking BPs at home, Today their BP  is BP: 128/68  She does not workout. She denies chest pain, shortness of breath, dizziness.   She is on cholesterol medication (didn't tolerate atorvastatin, pravastatin, had stiffness and joint achiness, was tolerating rosuvastatin 40 mg daily but had been off, has restarted). Her cholesterol is not at goal. The cholesterol last visit was:   Lab Results  Component Value Date   CHOL 294 (H) 02/24/2020   HDL 63 02/24/2020   LDLCALC 197 (H) 02/24/2020   TRIG 170 (H) 02/24/2020   CHOLHDL 4.7 02/24/2020   She has been working on diet and exercise for T2DM (on metformin ER 1000 mg BID, was started on glimepiride 4 mg BID (has been taking only at night, doesn't eat breakfast, was having low sugars after stopping soda, and ozempic last OV up to 0.5 mg), she is on bASA, she is not on ACE/ARB, she is not on statin due to intolerance and denies foot ulcerations, increased appetite, nausea, paresthesia of the feet, polydipsia, polyuria, visual disturbances, vomiting and weight loss.  Does have retinopathy per eye exam 03/10/2020 She does check fasting glucose, typically 80-100, very rarely lower or higher  Last A1C in the office was:  Lab Results  Component Value Date   HGBA1C >14.0 (H) 02/24/2020   Last GFR: Lab Results  Component Value Date   GFRNONAA 71 02/24/2020   Patient is currently on Vitamin D supplement, taking regulary.  Lab Results  Component Value Date   VD25OH 36 02/24/2020      Current Medications:  Current Outpatient Medications on File Prior to Visit  Medication Sig Dispense Refill  . aspirin EC 81 MG tablet Take 81 mg by mouth daily.    . Blood Glucose Monitoring Suppl DEVI Test blood sugar once daily Dx:E11.65 1 each 0  . glimepiride (AMARYL) 4 MG tablet Take 1 tab twice daily with meals for diabetes. 180 tablet 1  . glucose blood (FREESTYLE LITE) test strip Test blood sugar once daily 100 each 11  . glucose blood test strip Test blood sugar once daily Dx:E11.65 100 each  12  . Lancets MISC Test blood sugar once daily 100 each 11  . Lancets MISC Use to test blood sugar daily. Dx:E11.65 200 each 11  . rosuvastatin (CRESTOR) 40 MG tablet Takes 1 tablet everyday 90 tablet 1  . Semaglutide,0.25 or 0.5MG /DOS, (OZEMPIC, 0.25 OR 0.5 MG/DOSE,) 2 MG/1.5ML SOPN Inject 0.5 mg subcutaneously once weekly into abdomen for sugars and weight loss. 4.5 mL 0  . tiZANidine (ZANAFLEX) 4 MG capsule TAKE 1 CAPSULE BY MOUTH 3 TIMES DAILY AS NEEDED FOR MUSCLE SPASMS 90 capsule 2  . traZODone (DESYREL) 50 MG tablet Take 1/2-1 tablet as needed for sleep 90 tablet 3  . benzonatate (TESSALON) 200 MG capsule Take one capsule up to three times a day as needed for cough. (Patient not taking: Reported on 05/25/2020) 60 capsule 1  . metFORMIN (GLUCOPHAGE-XR) 500 MG 24 hr tablet TAKE 2 TABLETS(1000 MG) BY MOUTH TWICE DAILY WITH A MEAL (Patient not taking: Reported on 05/25/2020) 360 tablet 1  . promethazine-dextromethorphan (  PROMETHAZINE-DM) 6.25-15 MG/5ML syrup Take 5 mLs by mouth 4 (four) times daily as needed for cough. (Patient not taking: Reported on 05/25/2020) 240 mL 1   No current facility-administered medications on file prior to visit.   Allergies:  Allergies  Allergen Reactions  . Atorvastatin     Myalgias  . Buspirone     "felt like I was going to pass out"    Medical History:  She has Type 2 diabetes mellitus with hyperglycemia, without long-term current use of insulin (HCC); Hypertension; Hyperlipidemia associated with type 2 diabetes mellitus (HCC); Obesity (BMI 30.0-34.9); Major depression in partial remission (HCC); Former smoker; Discoloration and thickening of nails both feet; Chronic venous insufficiency; Vitamin D deficiency; CKD stage 2 due to type 2 diabetes mellitus (HCC); Diabetic peripheral neuropathy associated with type 2 diabetes mellitus (HCC); TMJ (temporomandibular joint disorder); Current smoker; and Diabetic retinopathy (HCC) on their problem list.   Surgical  History:  She has a past surgical history that includes Cholecystectomy (2005) and Cesarean section (2000). Family History:  Herfamily history includes Renal Disease in her son. She was adopted. Social History:  She reports that she has been smoking cigarettes. She started smoking about 39 years ago. She has a 33.00 pack-year smoking history. She has never used smokeless tobacco. She reports current alcohol use. She reports that she does not use drugs.  Review of Systems: Review of Systems  Constitutional: Negative for malaise/fatigue and weight loss.  HENT: Negative for hearing loss and tinnitus.   Eyes: Negative for blurred vision and double vision.  Respiratory: Negative for cough, shortness of breath and wheezing.   Cardiovascular: Negative for chest pain, palpitations, orthopnea, claudication and leg swelling (chronic, mild, bil).  Gastrointestinal: Negative for abdominal pain, blood in stool, constipation, diarrhea, heartburn, melena, nausea and vomiting.  Genitourinary: Negative.   Musculoskeletal: Positive for back pain (lumbar, ortho following). Negative for joint pain and myalgias.  Skin: Negative for rash.  Neurological: Positive for tingling (bil feet). Negative for dizziness, sensory change, weakness and headaches.  Endo/Heme/Allergies: Negative for polydipsia.  Psychiatric/Behavioral: Negative for depression, hallucinations, substance abuse and suicidal ideas. The patient has insomnia. The patient is not nervous/anxious.   All other systems reviewed and are negative.   Physical Exam: Estimated body mass index is 31.63 kg/m as calculated from the following:   Height as of 02/24/20: 5' 7.5" (1.715 m).   Weight as of this encounter: 205 lb (93 kg). BP 128/68   Pulse 86   Temp (!) 96.3 F (35.7 C)   Wt 205 lb (93 kg)   SpO2 97%   BMI 31.63 kg/m  General Appearance: Well nourished, appears older than stated age, in no apparent distress.  Eyes: PERRLA, EOMs, conjunctiva no  swelling or erythema Sinuses: No Frontal/maxillary tenderness  ENT/Mouth: Ext aud canals clear, normal light reflex with TMs without erythema, bulging. Good dentition. No erythema, swelling, or exudate on post pharynx. Tonsils not swollen or erythematous. Hearing normal. Missing extensive teeth.  Neck: Supple, thyroid normal. No bruits  Respiratory: Respiratory effort normal, BS equal bilaterally without rales, rhonchi, wheezing or stridor.  Cardio: RRR without murmurs, rubs or gallops. Brisk peripheral pulses with 1+ bilateral non-pitting edema to ankles.  Chest: symmetric, with normal excursions and percussion.  Abdomen: Soft, nontender, no guarding, rebound, hernias, masses, or organomegaly.  Lymphatics: Non tender without lymphadenopathy.  Musculoskeletal: Full ROM all peripheral extremities, 5/5 strength, and normal gait.  Skin: Warm, dry without rashes, lesions, ecchymosis. Bilateral toe nails yellowed, brittle  distally.  Neuro: Cranial nerves intact, reflexes equal bilaterally. Normal muscle tone, no cerebellar symptoms. Sensation intact to monofilament bilaterally.  Psych: Awake and oriented X 3, normal affect, Insight and Judgment appropriate.    Carlyon Shadow Diania Co 3:00 PM [facility]

## 2020-05-25 ENCOUNTER — Ambulatory Visit (INDEPENDENT_AMBULATORY_CARE_PROVIDER_SITE_OTHER): Payer: BC Managed Care – PPO | Admitting: Adult Health

## 2020-05-25 ENCOUNTER — Encounter: Payer: Self-pay | Admitting: Adult Health

## 2020-05-25 ENCOUNTER — Other Ambulatory Visit: Payer: Self-pay

## 2020-05-25 VITALS — BP 128/68 | HR 86 | Temp 96.3°F | Wt 205.0 lb

## 2020-05-25 DIAGNOSIS — E66811 Obesity, class 1: Secondary | ICD-10-CM

## 2020-05-25 DIAGNOSIS — E785 Hyperlipidemia, unspecified: Secondary | ICD-10-CM | POA: Diagnosis not present

## 2020-05-25 DIAGNOSIS — I10 Essential (primary) hypertension: Secondary | ICD-10-CM | POA: Diagnosis not present

## 2020-05-25 DIAGNOSIS — Z79899 Other long term (current) drug therapy: Secondary | ICD-10-CM

## 2020-05-25 DIAGNOSIS — E1122 Type 2 diabetes mellitus with diabetic chronic kidney disease: Secondary | ICD-10-CM

## 2020-05-25 DIAGNOSIS — E1165 Type 2 diabetes mellitus with hyperglycemia: Secondary | ICD-10-CM | POA: Diagnosis not present

## 2020-05-25 DIAGNOSIS — E559 Vitamin D deficiency, unspecified: Secondary | ICD-10-CM

## 2020-05-25 DIAGNOSIS — F324 Major depressive disorder, single episode, in partial remission: Secondary | ICD-10-CM

## 2020-05-25 DIAGNOSIS — E1169 Type 2 diabetes mellitus with other specified complication: Secondary | ICD-10-CM

## 2020-05-25 DIAGNOSIS — E669 Obesity, unspecified: Secondary | ICD-10-CM

## 2020-05-25 DIAGNOSIS — N182 Chronic kidney disease, stage 2 (mild): Secondary | ICD-10-CM

## 2020-05-25 MED ORDER — OZEMPIC (1 MG/DOSE) 4 MG/3ML ~~LOC~~ SOPN: 1.0000 mg | PEN_INJECTOR | SUBCUTANEOUS | 1 refills | Status: DC

## 2020-05-25 NOTE — Patient Instructions (Signed)
Goals    . Blood Pressure < 130/80    . DIET - DECREASE SODA OR JUICE INTAKE    . Exercise 3x per week (30 min per time)    . HEMOGLOBIN A1C < 7.0    . Weight (lb) < 180 lb (81.6 kg)       Restart metformin - slow taper from 1 tab up to 4 as tolerated, with dinner  Stop glimepiride - take 1/2 tab only if needed for very high sugar - can cause sugars to get too low "insulin in a pill"  Increase ozempic from 0.5 mg/week to 1 mg/week - big weight loss potential with this dose, and stoping glimepiride will help  Try trazodone 100 mg (2 tabs of 50 mg) at night for sleep -

## 2020-05-26 LAB — CBC WITH DIFFERENTIAL/PLATELET
Absolute Monocytes: 708 cells/uL (ref 200–950)
Basophils Absolute: 71 cells/uL (ref 0–200)
Basophils Relative: 0.6 %
Eosinophils Absolute: 307 cells/uL (ref 15–500)
Eosinophils Relative: 2.6 %
HCT: 40.9 % (ref 35.0–45.0)
Hemoglobin: 13.7 g/dL (ref 11.7–15.5)
Lymphs Abs: 3528 cells/uL (ref 850–3900)
MCH: 29.4 pg (ref 27.0–33.0)
MCHC: 33.5 g/dL (ref 32.0–36.0)
MCV: 87.8 fL (ref 80.0–100.0)
MPV: 9.8 fL (ref 7.5–12.5)
Monocytes Relative: 6 %
Neutro Abs: 7186 cells/uL (ref 1500–7800)
Neutrophils Relative %: 60.9 %
Platelets: 315 10*3/uL (ref 140–400)
RBC: 4.66 10*6/uL (ref 3.80–5.10)
RDW: 12.8 % (ref 11.0–15.0)
Total Lymphocyte: 29.9 %
WBC: 11.8 10*3/uL — ABNORMAL HIGH (ref 3.8–10.8)

## 2020-05-26 LAB — COMPLETE METABOLIC PANEL WITH GFR
AG Ratio: 1.4 (calc) (ref 1.0–2.5)
ALT: 18 U/L (ref 6–29)
AST: 18 U/L (ref 10–35)
Albumin: 4.2 g/dL (ref 3.6–5.1)
Alkaline phosphatase (APISO): 52 U/L (ref 37–153)
BUN: 20 mg/dL (ref 7–25)
CO2: 32 mmol/L (ref 20–32)
Calcium: 9.9 mg/dL (ref 8.6–10.4)
Chloride: 103 mmol/L (ref 98–110)
Creat: 0.84 mg/dL (ref 0.50–1.05)
GFR, Est African American: 93 mL/min/{1.73_m2} (ref >=60)
GFR, Est Non African American: 80 mL/min/{1.73_m2} (ref >=60)
Globulin: 2.9 g/dL (calc) (ref 1.9–3.7)
Glucose, Bld: 66 mg/dL (ref 65–99)
Potassium: 4.7 mmol/L (ref 3.5–5.3)
Sodium: 141 mmol/L (ref 135–146)
Total Bilirubin: 0.4 mg/dL (ref 0.2–1.2)
Total Protein: 7.1 g/dL (ref 6.1–8.1)

## 2020-05-26 LAB — LIPID PANEL
Cholesterol: 170 mg/dL (ref <200)
HDL: 74 mg/dL (ref >=50)
LDL Cholesterol (Calc): 78 mg/dL (calc)
Non-HDL Cholesterol (Calc): 96 mg/dL (calc) (ref <130)
Total CHOL/HDL Ratio: 2.3 (calc) (ref <5.0)
Triglycerides: 92 mg/dL (ref <150)

## 2020-05-26 LAB — HEMOGLOBIN A1C
Hgb A1c MFr Bld: 7.3 % of total Hgb — ABNORMAL HIGH (ref <5.7)
Mean Plasma Glucose: 163 mg/dL
eAG (mmol/L): 9 mmol/L

## 2020-05-26 LAB — MAGNESIUM: Magnesium: 2 mg/dL (ref 1.5–2.5)

## 2020-05-26 LAB — TSH: TSH: 1.81 mIU/L

## 2020-05-27 ENCOUNTER — Other Ambulatory Visit: Payer: Self-pay

## 2020-05-27 MED ORDER — VITAMIN D3 250 MCG (10000 UT) PO TABS: ORAL_TABLET | ORAL | 0 refills | Status: AC

## 2020-05-27 MED ORDER — WOMENS MULTIVITAMIN PO TABS: ORAL_TABLET | ORAL | 0 refills | Status: DC

## 2020-05-27 MED ORDER — ZINC 50 MG PO TABS: ORAL_TABLET | ORAL | 0 refills | Status: DC

## 2020-05-27 MED ORDER — C COMPLEX PO TBCR: EXTENDED_RELEASE_TABLET | ORAL | 0 refills | Status: DC

## 2020-05-27 MED ORDER — FISH OIL 1200 MG PO CAPS: ORAL_CAPSULE | ORAL | Status: DC

## 2020-05-27 MED ORDER — GNP L-LYSINE 600 MG PO TABS: ORAL_TABLET | ORAL | Status: AC

## 2020-05-27 MED ORDER — COQ10 100 MG PO CAPS: ORAL_CAPSULE | ORAL | 0 refills | Status: DC

## 2020-05-27 MED ORDER — MAGNESIUM 500 MG PO TABS: ORAL_TABLET | ORAL | Status: AC

## 2020-06-06 ENCOUNTER — Other Ambulatory Visit: Payer: Self-pay

## 2020-06-06 ENCOUNTER — Ambulatory Visit (INDEPENDENT_AMBULATORY_CARE_PROVIDER_SITE_OTHER): Payer: BC Managed Care – PPO | Admitting: Adult Health

## 2020-06-06 ENCOUNTER — Encounter: Payer: Self-pay | Admitting: Adult Health

## 2020-06-06 VITALS — BP 110/60 | HR 68 | Temp 96.6°F | Wt 204.0 lb

## 2020-06-06 DIAGNOSIS — M545 Low back pain, unspecified: Secondary | ICD-10-CM

## 2020-06-06 DIAGNOSIS — M62830 Muscle spasm of back: Secondary | ICD-10-CM

## 2020-06-06 MED ORDER — IBUPROFEN 800 MG PO TABS: 800.0000 mg | ORAL_TABLET | Freq: Three times a day (TID) | ORAL | 1 refills | Status: DC | PRN

## 2020-06-06 NOTE — Progress Notes (Signed)
Assessment and Plan:  Victorino Dike was seen today for abdominal pain and back pain.  Diagnoses and all orders for this visit:  Lumbar back pain/ Back muscle spasm - negative straight leg Prednisone was not prescribed,NSAIDs, RICE, and exercise given If not better follow up in office or will refer to PT/orthopedics. Natural history and expected course discussed. Questions answered. Agricultural engineer distributed. Proper lifting, bending technique discussed. Stretching exercises discussed. Short (2-4 day) period of relative rest recommended until acute symptoms improve. Heat to affected area as needed for local pain relief. NSAIDs per medication orders. Muscle relaxants per medication orders. -     DG Lumbar Spine Complete; Future -     ibuprofen (ADVIL) 800 MG tablet; Take 1 tablet (800 mg total) by mouth every 8 (eight) hours as needed. Take with food on stomach.   Further disposition pending results of labs. Discussed med's effects and SE's.   Over 15 minutes of exam, counseling, chart review, and critical decision making was performed.   Future Appointments  Date Time Provider Department Center  09/30/2020 10:30 AM Judd Gaudier, NP [facility] None  02/23/2021  3:00 PM Judd Gaudier, NP [facility] None    ------------------------------------------------------------------------------------------------------------------   HPI BP 110/60   Pulse 68   Temp (!) 96.6 F (35.9 C)   Wt 204 lb (92.5 kg)   SpO2 95%   BMI 31.48 kg/m   54 y.o.female presents for evaluation of lower back pain. Hx of recurrent limited back pain episodes. Remote hx of L4-5 disc space narrowing per lumbar xray 2011.   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  Reports started after she lay down to sleep last night, started in mid/lower back, catching sensation with muscle spasm, worse if sitting without back support or with long period standing.   She has been prescribed tizanidine  4 mg for previous episodes which works well for her. Finds rest helps the most. Hasn't tried any tylenol or NSAID. Improved but not resolved today with muscle relaxer and rest. Has done well with ibuprofen 800 mg in the past.   Past Medical History:  Diagnosis Date  . Diabetes mellitus without complication (HCC)   . Leg pain, left   . 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 Atorvastatin     Myalgias  . Buspirone     "felt like I was going to pass out"     Current Outpatient Medications on File Prior to Visit  Medication Sig  . aspirin EC 81 MG tablet Take 81 mg by mouth daily.  Marland Kitchen Bioflavonoid Products (C COMPLEX) TBCR Take 750mg  daily  . Blood Glucose Monitoring Suppl DEVI Test blood sugar once daily Dx:E11.65  . Cholecalciferol (VITAMIN D3) 250 MCG (10000 UT) TABS Take one tablet daily  . Coenzyme Q10 (COQ10) 100 MG CAPS Take one capsule daily  . glimepiride (AMARYL) 4 MG tablet Take 1 tab twice daily with meals for diabetes.  glucose blood (FREESTYLE LITE) test strip Test blood sugar once daily  . glucose blood test strip Test blood sugar once daily Dx:E11.65  . Lancets MISC Test blood sugar once daily  . Lancets MISC Use to test blood sugar daily. Dx:E11.65  . Lysine (GNP L-LYSINE) 600 MG TABS Take one tablet daily  . Magnesium 500 MG TABS Take one tablet daily  . metFORMIN (GLUCOPHAGE-XR) 500 MG 24 hr tablet TAKE 2 TABLETS(1000 MG) BY MOUTH TWICE DAILY WITH A MEAL  .  Multiple Vitamins-Minerals (WOMENS MULTIVITAMIN) TABS Take one tablet daily  . Omega-3 Fatty Acids (FISH OIL) 1200 MG CAPS Take one capsule daily  . rosuvastatin (CRESTOR) 40 MG tablet Takes 1 tablet everyday  . Semaglutide, 1 MG/DOSE, (OZEMPIC, 1 MG/DOSE,) 4 MG/3ML SOPN Inject 1 mg into the skin once a week.  Marland Kitchen tiZANidine (ZANAFLEX) 4 MG capsule TAKE 1 CAPSULE BY MOUTH 3 TIMES DAILY AS NEEDED FOR MUSCLE SPASMS  . traZODone (DESYREL) 50 MG tablet Take 1/2-1 tablet as needed for sleep   . Zinc 50 MG TABS Take one tablet daily   No current facility-administered medications on file prior to visit.    ROS: all negative except above.   Physical Exam:  BP 110/60   Pulse 68   Temp (!) 96.6 F (35.9 C)   Wt 204 lb (92.5 kg)   SpO2 95%   BMI 31.48 kg/m   General Appearance: Well nourished, in no apparent distress. Eyes: conjunctiva no swelling or erythema ENT/Mouth: Mask in place; Hearing normal.  Neck: Supple Respiratory: Respiratory effort normal Cardio: RRR with no MRGs. Brisk peripheral pulses without edema.  Abdomen: Soft, + BS.  Non tender Lymphatics: Non tender without lymphadenopathy.  Musculoskeletal: Patient is able to ambulate well. Gait is not  Antalgic. Straight leg raising with dorsiflexion negative bilaterally for radicular symptoms. Sensory exam in the legs are normal. Knee reflexes are normal Ankle reflexes are normal Strength is normal and symmetric in arms and legs. There is not SI tenderness to palpation.  There is paraspinal muscle spasm.  There is not midline tenderness.  ROM of spine with  limited in all spheres due to pain.  Skin: Warm, dry without rashes, lesions, ecchymosis.  Neuro: Normal muscle tone, Sensation intact.  Psych: Awake and oriented X 3, normal affect, Insight and Judgment appropriate.     Dan Maker, NP 4:34 PM [facility]

## 2020-06-06 NOTE — Patient Instructions (Signed)
Try 5-10 min of stretching after work each day  Recommend getting shoes with better foot support      Back Exercises These exercises help to make your trunk and back strong. They also help to keep the lower back flexible. Doing these exercises can help to prevent back pain or lessen existing pain.  If you have back pain, try to do these exercises 2-3 times each day or as told by your doctor.  As you get better, do the exercises once each day. Repeat the exercises more often as told by your doctor.  To stop back pain from coming back, do the exercises once each day, or as told by your doctor. Exercises Single knee to chest Do these steps 3-5 times in a row for each leg: 1. Lie on your back on a firm bed or the floor with your legs stretched out. 2. Bring one knee to your chest. 3. Grab your knee or thigh with both hands and hold them it in place. 4. Pull on your knee until you feel a gentle stretch in your lower back or buttocks. 5. Keep doing the stretch for 10-30 seconds. 6. Slowly let go of your leg and straighten it. Pelvic tilt Do these steps 5-10 times in a row: 1. Lie on your back on a firm bed or the floor with your legs stretched out. 2. Bend your knees so they point up to the ceiling. Your feet should be flat on the floor. 3. Tighten your lower belly (abdomen) muscles to press your lower back against the floor. This will make your tailbone point up to the ceiling instead of pointing down to your feet or the floor. 4. Stay in this position for 5-10 seconds while you gently tighten your muscles and breathe evenly. Cat-cow Do these steps until your lower back bends more easily: 1. Get on your hands and knees on a firm surface. Keep your hands under your shoulders, and keep your knees under your hips. You may put padding under your knees. 2. Let your head hang down toward your chest. Tighten (contract) the muscles in your belly. Point your tailbone toward the floor so your  lower back becomes rounded like the back of a cat. 3. Stay in this position for 5 seconds. 4. Slowly lift your head. Let the muscles of your belly relax. Point your tailbone up toward the ceiling so your back forms a sagging arch like the back of a cow. 5. Stay in this position for 5 seconds.   Press-ups Do these steps 5-10 times in a row: 1. Lie on your belly (face-down) on the floor. 2. Place your hands near your head, about shoulder-width apart. 3. While you keep your back relaxed and keep your hips on the floor, slowly straighten your arms to raise the top half of your body and lift your shoulders. Do not use your back muscles. You may change where you place your hands in order to make yourself more comfortable. 4. Stay in this position for 5 seconds. 5. Slowly return to lying flat on the floor.   Bridges Do these steps 10 times in a row: 1. Lie on your back on a firm surface. 2. Bend your knees so they point up to the ceiling. Your feet should be flat on the floor. Your arms should be flat at your sides, next to your body. 3. Tighten your butt muscles and lift your butt off the floor until your waist is almost as high as  your knees. If you do not feel the muscles working in your butt and the back of your thighs, slide your feet 1-2 inches farther away from your butt. 4. Stay in this position for 3-5 seconds. 5. Slowly lower your butt to the floor, and let your butt muscles relax. If this exercise is too easy, try doing it with your arms crossed over your chest.   Belly crunches Do these steps 5-10 times in a row: 1. Lie on your back on a firm bed or the floor with your legs stretched out. 2. Bend your knees so they point up to the ceiling. Your feet should be flat on the floor. 3. Cross your arms over your chest. 4. Tip your chin a little bit toward your chest but do not bend your neck. 5. Tighten your belly muscles and slowly raise your chest just enough to lift your shoulder blades a  tiny bit off of the floor. Avoid raising your body higher than that, because it can put too much stress on your low back. 6. Slowly lower your chest and your head to the floor. Back lifts Do these steps 5-10 times in a row: 1. Lie on your belly (face-down) with your arms at your sides, and rest your forehead on the floor. 2. Tighten the muscles in your legs and your butt. 3. Slowly lift your chest off of the floor while you keep your hips on the floor. Keep the back of your head in line with the curve in your back. Look at the floor while you do this. 4. Stay in this position for 3-5 seconds. 5. Slowly lower your chest and your face to the floor. Contact a doctor if:  Your back pain gets a lot worse when you do an exercise.  Your back pain does not get better 2 hours after you exercise. If you have any of these problems, stop doing the exercises. Do not do them again unless your doctor says it is okay. Get help right away if:  You have sudden, very bad back pain. If this happens, stop doing the exercises. Do not do them again unless your doctor says it is okay. This information is not intended to replace advice given to you by your health care provider. Make sure you discuss any questions you have with your health care provider. Document Revised: 09/26/2017 Document Reviewed: 09/26/2017 Elsevier Patient Education  2021 [employer].

## 2020-06-07 ENCOUNTER — Ambulatory Visit
Admission: RE | Admit: 2020-06-07 | Discharge: 2020-06-07 | Disposition: A | Discharge status: Home / Self Care | Payer: BC Managed Care – PPO | Source: Ambulatory Visit | Attending: Adult Health | Admitting: Adult Health

## 2020-06-07 DIAGNOSIS — M545 Low back pain, unspecified: Secondary | ICD-10-CM

## 2020-06-08 ENCOUNTER — Encounter: Payer: Self-pay | Admitting: *Deleted

## 2020-06-09 ENCOUNTER — Encounter: Payer: Self-pay | Admitting: Adult Health

## 2020-06-09 DIAGNOSIS — M545 Low back pain, unspecified: Secondary | ICD-10-CM | POA: Insufficient documentation

## 2020-08-13 ENCOUNTER — Other Ambulatory Visit: Payer: Self-pay | Admitting: Adult Health

## 2020-08-13 DIAGNOSIS — M62830 Muscle spasm of back: Secondary | ICD-10-CM

## 2020-09-02 NOTE — Progress Notes (Deleted)
Assessment and Plan:  There are no diagnoses linked to this encounter.    Further disposition pending results of labs. Discussed med's effects and SE's.   Over 30 minutes of exam, counseling, chart review, and critical decision making was performed.   Future Appointments  Date Time Provider Department Center  09/05/2020  9:00 AM Revonda Humphrey, NP [facility] None  09/30/2020 10:30 AM Judd Gaudier, NP [facility] None  02/23/2021  3:00 PM Judd Gaudier, NP [facility] None    ------------------------------------------------------------------------------------------------------------------   HPI There were no vitals taken for this visit. 55 y.o.female presents for  Past Medical History:  Diagnosis Date   Diabetes mellitus without complication (HCC)    Leg pain, left    Statin intolerance 03/28/2018     Allergies  Allergen Reactions   Atorvastatin     Myalgias   Buspirone     "felt like I was going to pass out"     Current Outpatient Medications on File Prior to Visit  Medication Sig   aspirin EC 81 MG tablet Take 81 mg by mouth daily.   Bioflavonoid Products (C COMPLEX) TBCR Take 750mg  daily   Blood Glucose Monitoring Suppl DEVI Test blood sugar once daily Dx:E11.65   Cholecalciferol (VITAMIN D3) 250 MCG (10000 UT) TABS Take one tablet daily   Coenzyme Q10 (COQ10) 100 MG CAPS Take one capsule daily   glimepiride (AMARYL) 4 MG tablet Take 1 tab twice daily with meals for diabetes.   glucose blood (FREESTYLE LITE) test strip Test blood sugar once daily   glucose blood test strip Test blood sugar once daily Dx:E11.65   ibuprofen (ADVIL) 800 MG tablet Take 1 tablet (800 mg total) by mouth every 8 (eight) hours as needed. Take with food on stomach.   Lancets MISC Test blood sugar once daily   Lancets MISC Use to test blood sugar daily. Dx:E11.65   Lysine (GNP L-LYSINE) 600 MG TABS Take one tablet daily   Magnesium 500 MG TABS Take one tablet daily   metFORMIN (GLUCOPHAGE-XR)  500 MG 24 hr tablet TAKE 2 TABLETS(1000 MG) BY MOUTH TWICE DAILY WITH A MEAL   Multiple Vitamins-Minerals (WOMENS MULTIVITAMIN) TABS Take one tablet daily   Omega-3 Fatty Acids (FISH OIL) 1200 MG CAPS Take one capsule daily   rosuvastatin (CRESTOR) 40 MG tablet Takes 1 tablet everyday   Semaglutide, 1 MG/DOSE, (OZEMPIC, 1 MG/DOSE,) 4 MG/3ML SOPN Inject 1 mg into the skin once a week.   tiZANidine (ZANAFLEX) 4 MG capsule TAKE 1 CAPSULE BY MOUTH 3 TIMES DAILY AS NEEDED FOR MUSCLE SPASMS.   traZODone (DESYREL) 50 MG tablet Take 1/2-1 tablet as needed for sleep   Zinc 50 MG TABS Take one tablet daily   No current facility-administered medications on file prior to visit.    ROS: all negative except above.   Physical Exam:  There were no vitals taken for this visit.  General Appearance: Well nourished, in no apparent distress. Eyes: PERRLA, EOMs, conjunctiva no swelling or erythema Sinuses: No Frontal/maxillary tenderness ENT/Mouth: Ext aud canals clear, TMs without erythema, bulging. No erythema, swelling, or exudate on post pharynx.  Tonsils not swollen or erythematous. Hearing normal.  Neck: Supple, thyroid normal.  Respiratory: Respiratory effort normal, BS equal bilaterally without rales, rhonchi, wheezing or stridor.  Cardio: RRR with no MRGs. Brisk peripheral pulses without edema.  Abdomen: Soft, + BS.  Non tender, no guarding, rebound, hernias, masses. Lymphatics: Non tender without lymphadenopathy.  Musculoskeletal: Full ROM, 5/5 strength, normal gait.  Skin: Warm, dry without rashes, lesions, ecchymosis.  Neuro: Cranial nerves intact. Normal muscle tone, no cerebellar symptoms. Sensation intact.  Psych: Awake and oriented X 3, normal affect, Insight and Judgment appropriate.     Revonda Humphrey, NP 12:06 PM [facility]

## 2020-09-05 ENCOUNTER — Ambulatory Visit: Payer: BC Managed Care – PPO | Admitting: Nurse Practitioner

## 2020-09-30 ENCOUNTER — Ambulatory Visit: Payer: BC Managed Care – PPO | Admitting: Adult Health

## 2020-10-12 ENCOUNTER — Encounter: Payer: Self-pay | Admitting: Adult Health

## 2020-10-12 ENCOUNTER — Ambulatory Visit: Payer: BC Managed Care – PPO | Admitting: Adult Health

## 2020-10-12 ENCOUNTER — Other Ambulatory Visit: Payer: Self-pay

## 2020-10-12 VITALS — BP 138/70 | HR 77 | Temp 97.2°F | Wt 216.0 lb

## 2020-10-12 DIAGNOSIS — E782 Mixed hyperlipidemia: Secondary | ICD-10-CM | POA: Diagnosis not present

## 2020-10-12 DIAGNOSIS — Z79899 Other long term (current) drug therapy: Secondary | ICD-10-CM

## 2020-10-12 DIAGNOSIS — N182 Chronic kidney disease, stage 2 (mild): Secondary | ICD-10-CM

## 2020-10-12 DIAGNOSIS — E1122 Type 2 diabetes mellitus with diabetic chronic kidney disease: Secondary | ICD-10-CM | POA: Diagnosis not present

## 2020-10-12 DIAGNOSIS — E785 Hyperlipidemia, unspecified: Secondary | ICD-10-CM

## 2020-10-12 DIAGNOSIS — E1169 Type 2 diabetes mellitus with other specified complication: Secondary | ICD-10-CM

## 2020-10-12 DIAGNOSIS — E66811 Obesity, class 1: Secondary | ICD-10-CM

## 2020-10-12 DIAGNOSIS — I10 Essential (primary) hypertension: Secondary | ICD-10-CM | POA: Diagnosis not present

## 2020-10-12 DIAGNOSIS — E1142 Type 2 diabetes mellitus with diabetic polyneuropathy: Secondary | ICD-10-CM | POA: Diagnosis not present

## 2020-10-12 DIAGNOSIS — F17200 Nicotine dependence, unspecified, uncomplicated: Secondary | ICD-10-CM

## 2020-10-12 DIAGNOSIS — F324 Major depressive disorder, single episode, in partial remission: Secondary | ICD-10-CM

## 2020-10-12 DIAGNOSIS — E1165 Type 2 diabetes mellitus with hyperglycemia: Secondary | ICD-10-CM | POA: Diagnosis not present

## 2020-10-12 DIAGNOSIS — E559 Vitamin D deficiency, unspecified: Secondary | ICD-10-CM

## 2020-10-12 DIAGNOSIS — Z599 Problem related to housing and economic circumstances, unspecified: Secondary | ICD-10-CM

## 2020-10-12 DIAGNOSIS — E669 Obesity, unspecified: Secondary | ICD-10-CM

## 2020-10-12 MED ORDER — TRAZODONE HCL 150 MG PO TABS: ORAL_TABLET | ORAL | 3 refills | Status: DC

## 2020-10-12 MED ORDER — METFORMIN HCL ER 500 MG PO TB24: ORAL_TABLET | ORAL | 1 refills | Status: DC

## 2020-10-12 MED ORDER — IBUPROFEN 800 MG PO TABS: 800.0000 mg | ORAL_TABLET | Freq: Three times a day (TID) | ORAL | 1 refills | Status: DC | PRN

## 2020-10-12 MED ORDER — ROSUVASTATIN CALCIUM 40 MG PO TABS: ORAL_TABLET | ORAL | 1 refills | Status: DC

## 2020-10-12 NOTE — Progress Notes (Signed)
3 MONTH FOLLOW UP  Assessment and Plan:  Type 2 diabetes mellitus with hyperglycemia, without long-term current use of insulin (HCC) Restart metformin 2000 mg daily, ozempic taper back up to 1 mg/week, samples x 10 weeks given, start paperwork for financial assistance Education: Reviewed 'ABCs' of diabetes management (respective goals in parentheses):  A1C (<7), blood pressure (<130/80), and cholesterol (LDL <70) Dietary recommendations Physical Activity recommendations -     COMPLETE METABOLIC PANEL WITH GFR -     Hemoglobin A1c -     Semaglutide,1.0 MG subcutaneously once weekly into abdomen   Hyperlipidemia associated with type 2 diabetes mellitus (HCC) Off of rosuvastatin 40 mg, resent, breaks intermittently if aching resumes LDL goal <70 Continue low cholesterol diet and exercise.  Check lipid panel.  -     Lipid panel -     TSH  T2DM with CKD II (HCC) Increase fluids, avoid NSAIDS, monitor sugars, will monitor       -     COMPLETE METABOLIC PANEL WITH GFR  Diabetic peripheral neuropathy associated with T2DM (HCC) Check feet daily; no concerns today  Obesity (BMI 30.0-34.9) Long discussion about weight loss, diet, and exercise Recommended diet heavy in fruits and veggies and low in animal meats, cheeses, and dairy products, appropriate calorie intake Discussed appropriate weight for height and initial goal (200 lb) Follow up at next visit  Major depressive disorder in partial remission, unspecified whether recurrent (HCC) Trazodone helping- sending in 150 mg tabs for cost savings, did well with 50-100 mg Lifestyle discussed: diet/exerise, sleep hygiene, stress management, hydration  Hypertension Currently fairly controlled off of meds; plan to restart ARB if trending up for renal protection; declines at this time Monitor blood pressure at home; call if consistently over 130/80 Continue DASH diet.   Reminder to go to the ER if any CP, SOB, nausea, dizziness, severe HA,  changes vision/speech, left arm numbness and tingling and jaw pain.  Medication management -     CBC with Differential/Platelet -     COMPLETE METABOLIC PANEL WITH GFR -     Magnesium  Former smoker - quit 926/2022 33+ year smoking hx;  Commended recent cessation; maintenance strategies discussed Declines meds Will follow up at the next visit Start low dose CT scan at age 3  Discussed med's effects and SE's. Labs and tests as requested with regular follow-up as recommended. Over 30 minutes of exam, counseling, chart review, and complex, high level critical decision making was performed this visit.   Future Appointments  Date Time Provider Department Center  02/23/2021  3:00 PM Judd Gaudier, NP [facility] None    HPI  55 y.o. female  presents for 3 month follow up for htn, hyperlipidemia, T2DM with CKD/neuropathy, smoking, obesity, vitamin D def.    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 Husband recently progressed to ESRD, on medicare and doing dialysis, financially struggling. Off of many meds due to financial barrier.   She does have hx of recurrent major depression; recently was on trazodone 50 mg with benefit, would like to restart.   She does have some intermittent mid back pain, takes tizanidine 4 mg at night PRN, also takes rare ibuprofen. Est with Dr. Otelia Sergeant for intermittent R shoulder pain.   She is an intermittent smoker with 30+ pack year history, recently quit again on 10/10/2020. She had normal CXR 2019. Denies respiratory sx.   BMI is Body mass index is 33.33 kg/m., she  has been working on diet, exercise is limited. 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 Has had limited benefit with phentermine/topamax/welbutrin. Initiated on semaglutide last OV, up to 0.5 mg weekly, did tolerate, was down 5 lb, off due to cost/insurance. She has reduced soda intake, does  drink some tea.  Wt Readings from Last 3 Encounters:  10/12/20 216 lb (98 kg)  06/06/20 204 lb (92.5 kg)  05/25/20 205 lb (93 kg)   She has not been checking BPs at home, today their BP is BP: 138/70  She does not workout. She denies chest pain, shortness of breath, dizziness.   She is on cholesterol medication (didn't tolerate atorvastatin, pravastatin, had stiffness and joint achiness, was tolerating rosuvastatin 40 mg daily with intermittent breaks but ran out, currently off). Her cholesterol is not at goal. The cholesterol last visit was:   Lab Results  Component Value Date   CHOL 170 05/25/2020   HDL 74 05/25/2020   LDLCALC 78 05/25/2020   TRIG 92 05/25/2020   CHOLHDL 2.3 05/25/2020   She has been working on diet and exercise for T2DM (on metformin ER 1000 mg BID, and ozempic last OV up to 1 mg but reports has been off of meds, financial), she is on bASA, she is not on ACE/ARB, she is on statin due to intolerance and denies foot ulcerations, increased appetite, nausea, paresthesia of the feet, polydipsia, polyuria, visual disturbances, vomiting and weight loss.  Does have retinopathy per eye exam 03/10/2020 She admits not recently checking glucose but has supplies.  Last A1C in the office was:  Lab Results  Component Value Date   HGBA1C 7.3 (H) 05/25/2020   Last GFR: Lab Results  Component Value Date   GFRNONAA 80 05/25/2020   GFRNONAA 71 02/24/2020   GFRNONAA 89 08/13/2019   Patient is currently on Vitamin D supplement, taking regulary.  Lab Results  Component Value Date   VD25OH 36 02/24/2020      Current Medications:  Current Outpatient Medications on File Prior to Visit  Medication Sig Dispense Refill   tiZANidine (ZANAFLEX) 4 MG capsule TAKE 1 CAPSULE BY MOUTH 3 TIMES DAILY AS NEEDED FOR MUSCLE SPASMS. 270 capsule 1   aspirin EC 81 MG tablet Take 81 mg by mouth daily. (Patient not taking: Reported on 10/12/2020)     Blood Glucose Monitoring Suppl DEVI Test blood  sugar once daily Dx:E11.65 (Patient not taking: Reported on 10/12/2020) 1 each 0   Cholecalciferol (VITAMIN D3) 250 MCG (10000 UT) TABS Take one tablet daily (Patient not taking: Reported on 10/12/2020) 30 tablet 0   glucose blood (FREESTYLE LITE) test strip Test blood sugar once daily (Patient not taking: Reported on 10/12/2020) 100 each 11   glucose blood test strip Test blood sugar once daily Dx:E11.65 (Patient not taking: Reported on 10/12/2020) 100 each 12   Lancets MISC Test blood sugar once daily (Patient not taking: Reported on 10/12/2020) 100 each 11   Lancets MISC Use to test blood sugar daily. Dx:E11.65 (Patient not taking: Reported on 10/12/2020) 200 each 11   Lysine (GNP L-LYSINE) 600 MG TABS Take one tablet daily (Patient not taking: Reported on 10/12/2020)     Magnesium 500 MG TABS Take one tablet daily (Patient not taking: Reported on 10/12/2020) 30 tablet    Multiple Vitamins-Minerals (WOMENS MULTIVITAMIN) TABS Take one tablet daily (Patient not taking: Reported on 10/12/2020) 30 tablet 0   Semaglutide, 1 MG/DOSE, (OZEMPIC, 1 MG/DOSE,) 4  MG/3ML SOPN Inject 1 mg into the skin once a week. (Patient not taking: Reported on 10/12/2020) 9 mL 1   No current facility-administered medications on file prior to visit.   Allergies:  Allergies  Allergen Reactions   Atorvastatin     Myalgias   Buspirone     "felt like I was going to pass out"    Medical History:  She has Type 2 diabetes mellitus with hyperglycemia, without long-term current use of insulin (HCC); Hypertension; Hyperlipidemia associated with type 2 diabetes mellitus (HCC); Obesity (BMI 30.0-34.9); Major depression in partial remission (HCC); Discoloration and thickening of nails both feet; Chronic venous insufficiency; Vitamin D deficiency; CKD stage 2 due to type 2 diabetes mellitus (HCC); Diabetic peripheral neuropathy associated with type 2 diabetes mellitus (HCC); TMJ (temporomandibular joint disorder); Current smoker; Diabetic  retinopathy (HCC); Recurrent low back pain episodes; and Financial difficulty on their problem list.   Surgical History:  She has a past surgical history that includes Cholecystectomy (2005) and Cesarean section (2000). Family History:  Herfamily history includes Renal Disease in her son. She was adopted. Social History:  She reports that she quit smoking 2 days ago. Her smoking use included cigarettes. She started smoking about 39 years ago. She has a 33.00 pack-year smoking history. She has never used smokeless tobacco. She reports current alcohol use. She reports that she does not use drugs.  Review of Systems: Review of Systems  Constitutional:  Negative for malaise/fatigue and weight loss.  HENT:  Negative for hearing loss and tinnitus.   Eyes:  Negative for blurred vision and double vision.  Respiratory:  Negative for cough, shortness of breath and wheezing.   Cardiovascular:  Negative for chest pain, palpitations, orthopnea, claudication and leg swelling (chronic, mild, bil).  Gastrointestinal:  Negative for abdominal pain, blood in stool, constipation, diarrhea, heartburn, melena, nausea and vomiting.  Genitourinary: Negative.   Musculoskeletal:  Positive for back pain (lumbar, ortho following) and joint pain (intermittent R shoulder). Negative for myalgias.  Skin:  Negative for rash.  Neurological:  Positive for tingling (bil feet). Negative for dizziness, sensory change, weakness and headaches.  Endo/Heme/Allergies:  Negative for polydipsia.  Psychiatric/Behavioral:  Negative for depression, hallucinations, substance abuse and suicidal ideas. The patient has insomnia. The patient is not nervous/anxious.   All other systems reviewed and are negative.  Physical Exam: Estimated body mass index is 33.33 kg/m as calculated from the following:   Height as of 02/24/20: 5' 7.5" (1.715 m).   Weight as of this encounter: 216 lb (98 kg). BP 138/70   Pulse 77   Temp (!) 97.2 F (36.2 C)    Wt 216 lb (98 kg)   SpO2 99%   BMI 33.33 kg/m  General Appearance: Well nourished, appears older than stated age, in no apparent distress.  Eyes: PERRLA, EOMs, conjunctiva no swelling or erythema Sinuses: No Frontal/maxillary tenderness  ENT/Mouth: Ext aud canals clear, normal light reflex with TMs without erythema, bulging. No erythema, swelling, or exudate on post pharynx. Tonsils not swollen or erythematous. Hearing normal. Missing extensive teeth.  Neck: Supple, thyroid normal. No bruits  Respiratory: Respiratory effort normal, BS equal bilaterally without rales, rhonchi, wheezing or stridor.  Cardio: RRR without murmurs, rubs or gallops. Brisk peripheral pulses with 1+ bilateral non-pitting edema to ankles.  Abdomen: Soft, nontender, no guarding, rebound, hernias, masses, or organomegaly.  Lymphatics: Non tender without lymphadenopathy.  Musculoskeletal: Full ROM all peripheral extremities, 5/5 strength, and normal gait.  Skin: Warm, dry without  rashes, lesions, ecchymosis. Bilateral toe nails yellowed, brittle distally.  Neuro: Cranial nerves intact, reflexes equal bilaterally. Normal muscle tone, no cerebellar symptoms. Sensation intact to monofilament bilaterally.  Psych: Awake and oriented X 3, normal affect, Insight and Judgment appropriate.   Dan Maker 5:28 PM [facility]

## 2020-10-12 NOTE — Patient Instructions (Signed)
Restart ozempic 0.25 mg/week for 2-4 weeks Then 0.5 mg weekly for 2 weeks (longer if having side effects) Then 1 mg/week  Restart metformin, aspirin, rosuvastatin (try every other day)  Call Dr. Otelia Sergeant for shoulder -    Affordable foods - beans, cabbage, onions/garlic, brown rice, oats with cinnamon, carrots, - frozen is a good way to get affordable

## 2020-10-13 LAB — COMPLETE METABOLIC PANEL WITH GFR
AG Ratio: 1.3 (calc) (ref 1.0–2.5)
ALT: 11 U/L (ref 6–29)
AST: 13 U/L (ref 10–35)
Albumin: 4.3 g/dL (ref 3.6–5.1)
Alkaline phosphatase (APISO): 84 U/L (ref 37–153)
BUN/Creatinine Ratio: 32 (calc) — ABNORMAL HIGH (ref 6–22)
BUN: 26 mg/dL — ABNORMAL HIGH (ref 7–25)
CO2: 28 mmol/L (ref 20–32)
Calcium: 9.9 mg/dL (ref 8.6–10.4)
Chloride: 98 mmol/L (ref 98–110)
Creat: 0.82 mg/dL (ref 0.50–1.03)
Globulin: 3.3 g/dL (calc) (ref 1.9–3.7)
Glucose, Bld: 309 mg/dL — ABNORMAL HIGH (ref 65–99)
Potassium: 5 mmol/L (ref 3.5–5.3)
Sodium: 135 mmol/L (ref 135–146)
Total Bilirubin: 0.3 mg/dL (ref 0.2–1.2)
Total Protein: 7.6 g/dL (ref 6.1–8.1)
eGFR: 85 mL/min/{1.73_m2} (ref >=60)

## 2020-10-13 LAB — LIPID PANEL
Cholesterol: 293 mg/dL — ABNORMAL HIGH (ref <200)
HDL: 77 mg/dL (ref >=50)
LDL Cholesterol (Calc): 177 mg/dL (calc) — ABNORMAL HIGH
Non-HDL Cholesterol (Calc): 216 mg/dL (calc) — ABNORMAL HIGH (ref <130)
Total CHOL/HDL Ratio: 3.8 (calc) (ref <5.0)
Triglycerides: 220 mg/dL — ABNORMAL HIGH (ref <150)

## 2020-10-13 LAB — CBC WITH DIFFERENTIAL/PLATELET
Absolute Monocytes: 881 cells/uL (ref 200–950)
Basophils Absolute: 124 cells/uL (ref 0–200)
Basophils Relative: 1.1 %
Eosinophils Absolute: 305 cells/uL (ref 15–500)
Eosinophils Relative: 2.7 %
HCT: 45 % (ref 35.0–45.0)
Hemoglobin: 14.8 g/dL (ref 11.7–15.5)
Lymphs Abs: 3650 cells/uL (ref 850–3900)
MCH: 29 pg (ref 27.0–33.0)
MCHC: 32.9 g/dL (ref 32.0–36.0)
MCV: 88.2 fL (ref 80.0–100.0)
MPV: 10.5 fL (ref 7.5–12.5)
Monocytes Relative: 7.8 %
Neutro Abs: 6339 cells/uL (ref 1500–7800)
Neutrophils Relative %: 56.1 %
Platelets: 324 10*3/uL (ref 140–400)
RBC: 5.1 10*6/uL (ref 3.80–5.10)
RDW: 11.6 % (ref 11.0–15.0)
Total Lymphocyte: 32.3 %
WBC: 11.3 10*3/uL — ABNORMAL HIGH (ref 3.8–10.8)

## 2020-10-13 LAB — MAGNESIUM: Magnesium: 2 mg/dL (ref 1.5–2.5)

## 2020-10-13 LAB — HEMOGLOBIN A1C
Hgb A1c MFr Bld: 9.3 % of total Hgb — ABNORMAL HIGH (ref <5.7)
Mean Plasma Glucose: 220 mg/dL
eAG (mmol/L): 12.2 mmol/L

## 2020-10-13 LAB — TSH: TSH: 3.02 mIU/L

## 2020-10-18 NOTE — Progress Notes (Signed)
Assessment and Plan:  Danielle Mccall was seen today for sore throat.  Diagnoses and all orders for this visit:  Encounter for screening for COVID-19 -     POC COVID-19  Sore throat   Benign exam today, monitor symptoms Pt to push fluids, use cepacol lozenges as needed      Further disposition pending results of labs. Discussed med's effects and SE's.   Over 20 minutes of exam, counseling, chart review, and critical decision making was performed.   Future Appointments  Date Time Provider Department Center  02/23/2021  3:00 PM Judd Gaudier, NP [facility] None    ------------------------------------------------------------------------------------------------------------------   HPI BP 130/64   Pulse 94   Temp 97.7 F (36.5 C)   Wt 214 lb 3.2 oz (97.2 kg)   SpO2 94%   BMI 33.05 kg/m  55 y.o.female presents for sore throat  Started having a sore throat 2 days ago.  Denies congestion, fever/chills, myalgias, nausea/vomiting and diarrhea. Does not normally have issues with allergies.   BMI is Body mass index is 33.05 kg/m., she has not been working on diet and exercise. Wt Readings from Last 3 Encounters:  10/19/20 214 lb 3.2 oz (97.2 kg)  10/12/20 216 lb (98 kg)  06/06/20 204 lb (92.5 kg)    Blood pressure is currently well controlled without medication BP Readings from Last 3 Encounters:  10/19/20 130/64  10/12/20 138/70  06/06/20 110/60    Pt is currently taking Metformin XR 500mg  2 tabs BID and Oempic 1 mg SQ QW. Blood sugars are running  Lab Results  Component Value Date   HGBA1C 9.3 (H) 10/12/2020    Past Medical History:  Diagnosis Date   Diabetes mellitus without complication (HCC)    Leg pain, left    Statin intolerance 03/28/2018     Allergies  Allergen Reactions   Atorvastatin     Myalgias   Buspirone     "felt like I was going to pass out"     Current Outpatient Medications on File Prior to Visit  Medication Sig   Cholecalciferol (VITAMIN D3)  250 MCG (10000 UT) TABS Take one tablet daily   ibuprofen (ADVIL) 800 MG tablet Take 1 tablet (800 mg total) by mouth every 8 (eight) hours as needed. Take with food on stomach.   Lysine (GNP L-LYSINE) 600 MG TABS Take one tablet daily   Magnesium 500 MG TABS Take one tablet daily   metFORMIN (GLUCOPHAGE-XR) 500 MG 24 hr tablet TAKE 2 TABLETS(1000 MG) BY MOUTH TWICE DAILY WITH A MEAL   rosuvastatin (CRESTOR) 40 MG tablet Takes 1 tablet everyday   Semaglutide, 1 MG/DOSE, (OZEMPIC, 1 MG/DOSE,) 4 MG/3ML SOPN Inject 1 mg into the skin once a week.   tiZANidine (ZANAFLEX) 4 MG capsule TAKE 1 CAPSULE BY MOUTH 3 TIMES DAILY AS NEEDED FOR MUSCLE SPASMS.   traZODone (DESYREL) 150 MG tablet Take 1/3-1 tablet daily for mood and sleep.   aspirin EC 81 MG tablet Take 81 mg by mouth daily. (Patient not taking: No sig reported)   Blood Glucose Monitoring Suppl DEVI Test blood sugar once daily Dx:E11.65 (Patient not taking: Reported on 10/12/2020)   glucose blood (FREESTYLE LITE) test strip Test blood sugar once daily (Patient not taking: Reported on 10/12/2020)   glucose blood test strip Test blood sugar once daily Dx:E11.65 (Patient not taking: Reported on 10/12/2020)   Lancets MISC Test blood sugar once daily (Patient not taking: Reported on 10/12/2020)   Lancets MISC Use to test blood  sugar daily. Dx:E11.65 (Patient not taking: Reported on 10/12/2020)   Multiple Vitamins-Minerals (WOMENS MULTIVITAMIN) TABS Take one tablet daily (Patient not taking: No sig reported)   No current facility-administered medications on file prior to visit.    ROS: all negative except above.   Physical Exam:  BP 130/64   Pulse 94   Temp 97.7 F (36.5 C)   Wt 214 lb 3.2 oz (97.2 kg)   SpO2 94%   BMI 33.05 kg/m   General Appearance: Well nourished, in no apparent distress. Eyes: PERRLA, EOMs, conjunctiva no swelling or erythema Sinuses: No Frontal/maxillary tenderness ENT/Mouth: Ext aud canals clear, TMs without erythema,  bulging. No erythema, swelling, or exudate on post pharynx.  Tonsils not swollen or erythematous. Hearing normal.  Neck: Supple, thyroid normal.  Respiratory: Respiratory effort normal, BS equal bilaterally without rales, rhonchi, wheezing or stridor.  Cardio: RRR with no MRGs. Brisk peripheral pulses without edema.  Abdomen: Soft, + BS.  Non tender, no guarding, rebound, hernias, masses. Lymphatics: Non tender without lymphadenopathy.  Musculoskeletal: Full ROM, 5/5 strength, normal gait.  Skin: Warm, dry without rashes, lesions, ecchymosis.  Psych: Awake and oriented X 3, normal affect, Insight and Judgment appropriate.     Revonda Humphrey, NP 9:10 AM [facility]

## 2020-10-19 ENCOUNTER — Encounter: Payer: Self-pay | Admitting: Nurse Practitioner

## 2020-10-19 ENCOUNTER — Ambulatory Visit (INDEPENDENT_AMBULATORY_CARE_PROVIDER_SITE_OTHER): Payer: BC Managed Care – PPO | Admitting: Nurse Practitioner

## 2020-10-19 ENCOUNTER — Other Ambulatory Visit: Payer: Self-pay

## 2020-10-19 VITALS — BP 130/64 | HR 94 | Temp 97.7°F | Wt 214.2 lb

## 2020-10-19 DIAGNOSIS — Z1152 Encounter for screening for COVID-19: Secondary | ICD-10-CM

## 2020-10-19 DIAGNOSIS — J029 Acute pharyngitis, unspecified: Secondary | ICD-10-CM | POA: Diagnosis not present

## 2020-10-19 LAB — POC COVID19 BINAXNOW: SARS Coronavirus 2 Ag: NEGATIVE

## 2020-10-19 NOTE — Patient Instructions (Signed)
Sore Throat ?A sore throat is pain, burning, irritation, or scratchiness in the throat. When you have a sore throat, you may feel pain or tenderness in your throat when you swallow or talk. ?Many things can cause a sore throat, including: ?An infection. ?Seasonal allergies. ?Dryness in the air. ?Irritants, such as smoke or pollution. ?Radiation treatment for cancer. ?Gastroesophageal reflux disease (GERD). ?A tumor. ?A sore throat is often the first sign of another sickness. It may happen with other symptoms, such as coughing, sneezing, fever, and swollen neck glands. Most sore throats go away without medical treatment. ?Follow these instructions at home: ?  ?Medicines ?Take over-the-counter and prescription medicines only as told by your health care provider. ?Children often get sore throats. Do not give your child aspirin because of the association with Reye's syndrome. ?Use throat sprays to soothe your throat as told by your health care provider. ?Managing pain ?To help with pain, try: ?Sipping warm liquids, such as broth, herbal tea, or warm water. ?Eating or drinking cold or frozen liquids, such as frozen ice pops. ?Gargling with a mixture of salt and water 3-4 times a day or as needed. To make salt water, completely dissolve ?-1 tsp (3-6 g) of salt in 1 cup (237 mL) of warm water. ?Sucking on hard candy or throat lozenges. ?Putting a cool-mist humidifier in your bedroom at night to moisten the air. ?Sitting in the bathroom with the door closed for 5-10 minutes while you run hot water in the shower. ?General instructions ?Do not use any products that contain nicotine or tobacco. These products include cigarettes, chewing tobacco, and vaping devices, such as e-cigarettes. If you need help quitting, ask your health care provider. ?Rest as needed. ?Drink enough fluid to keep your urine pale yellow. ?Wash your hands often with soap and water for at least 20 seconds. If soap and water are not available, use hand  sanitizer. ?Contact a health care provider if: ?You have a fever for more than 2-3 days. ?You have symptoms that last for more than 2-3 days. ?Your throat does not get better within 7 days. ?You have a fever and your symptoms suddenly get worse. ?Get help right away if: ?You have difficulty breathing. ?You cannot swallow fluids, soft foods, or your saliva. ?You have increased swelling in your throat or neck. ?You have persistent nausea and vomiting. ?These symptoms may represent a serious problem that is an emergency. Do not wait to see if the symptoms will go away. Get medical help right away. Call your local emergency services (911 in the [place] Do not drive yourself to the hospital. ?Summary ?A sore throat is pain, burning, irritation, or scratchiness in the throat. Many things can cause a sore throat. ?Take over-the-counter medicines only as told by your health care provider. ?Rest as needed. ?Drink enough fluid to keep your urine pale yellow. ?Contact a health care provider if your throat does not get better within 7 days. ?This information is not intended to replace advice given to you by your health care provider. Make sure you discuss any questions you have with your health care provider. ?Document Revised: 03/30/2020 Document Reviewed: 03/30/2020 ?Elsevier Patient Education ? 2022 [employer] ? ?

## 2020-11-13 ENCOUNTER — Other Ambulatory Visit: Payer: Self-pay | Admitting: Internal Medicine

## 2020-11-13 DIAGNOSIS — J329 Chronic sinusitis, unspecified: Secondary | ICD-10-CM

## 2020-11-13 DIAGNOSIS — J40 Bronchitis, not specified as acute or chronic: Secondary | ICD-10-CM

## 2020-11-13 DIAGNOSIS — E1165 Type 2 diabetes mellitus with hyperglycemia: Secondary | ICD-10-CM

## 2020-11-13 MED ORDER — GLIPIZIDE 5 MG PO TABS: ORAL_TABLET | ORAL | 0 refills | Status: DC

## 2020-11-13 MED ORDER — AZITHROMYCIN 250 MG PO TABS: ORAL_TABLET | ORAL | 1 refills | Status: DC

## 2020-11-13 MED ORDER — DEXAMETHASONE 4 MG PO TABS: ORAL_TABLET | ORAL | 0 refills | Status: DC

## 2020-11-14 ENCOUNTER — Ambulatory Visit: Payer: BC Managed Care – PPO

## 2020-11-29 ENCOUNTER — Encounter: Payer: BC Managed Care – PPO | Admitting: Adult Health

## 2021-01-17 ENCOUNTER — Encounter: Payer: BC Managed Care – PPO | Admitting: Adult Health

## 2021-01-31 ENCOUNTER — Encounter: Payer: Self-pay | Admitting: Adult Health

## 2021-02-23 ENCOUNTER — Other Ambulatory Visit: Payer: Self-pay

## 2021-02-23 ENCOUNTER — Encounter: Payer: Self-pay | Admitting: Adult Health

## 2021-02-23 ENCOUNTER — Ambulatory Visit (INDEPENDENT_AMBULATORY_CARE_PROVIDER_SITE_OTHER): Payer: BC Managed Care – PPO | Admitting: Adult Health

## 2021-02-23 VITALS — BP 132/66 | HR 78 | Temp 97.7°F | Ht 67.5 in | Wt 212.4 lb

## 2021-02-23 DIAGNOSIS — Z9189 Other specified personal risk factors, not elsewhere classified: Secondary | ICD-10-CM

## 2021-02-23 DIAGNOSIS — E538 Deficiency of other specified B group vitamins: Secondary | ICD-10-CM

## 2021-02-23 DIAGNOSIS — Z0000 Encounter for general adult medical examination without abnormal findings: Secondary | ICD-10-CM | POA: Diagnosis not present

## 2021-02-23 DIAGNOSIS — R9431 Abnormal electrocardiogram [ECG] [EKG]: Secondary | ICD-10-CM

## 2021-02-23 DIAGNOSIS — G8929 Other chronic pain: Secondary | ICD-10-CM

## 2021-02-23 DIAGNOSIS — E1165 Type 2 diabetes mellitus with hyperglycemia: Secondary | ICD-10-CM

## 2021-02-23 DIAGNOSIS — E1122 Type 2 diabetes mellitus with diabetic chronic kidney disease: Secondary | ICD-10-CM

## 2021-02-23 DIAGNOSIS — Z1211 Encounter for screening for malignant neoplasm of colon: Secondary | ICD-10-CM

## 2021-02-23 DIAGNOSIS — I10 Essential (primary) hypertension: Secondary | ICD-10-CM

## 2021-02-23 DIAGNOSIS — N182 Chronic kidney disease, stage 2 (mild): Secondary | ICD-10-CM

## 2021-02-23 DIAGNOSIS — Z23 Encounter for immunization: Secondary | ICD-10-CM

## 2021-02-23 DIAGNOSIS — Z1329 Encounter for screening for other suspected endocrine disorder: Secondary | ICD-10-CM

## 2021-02-23 DIAGNOSIS — F17200 Nicotine dependence, unspecified, uncomplicated: Secondary | ICD-10-CM

## 2021-02-23 DIAGNOSIS — Z131 Encounter for screening for diabetes mellitus: Secondary | ICD-10-CM

## 2021-02-23 DIAGNOSIS — Z130 Encounter for screening for diseases of the blood and blood-forming organs and certain disorders involving the immune mechanism: Secondary | ICD-10-CM

## 2021-02-23 DIAGNOSIS — E11319 Type 2 diabetes mellitus with unspecified diabetic retinopathy without macular edema: Secondary | ICD-10-CM

## 2021-02-23 DIAGNOSIS — E559 Vitamin D deficiency, unspecified: Secondary | ICD-10-CM | POA: Diagnosis not present

## 2021-02-23 DIAGNOSIS — E1142 Type 2 diabetes mellitus with diabetic polyneuropathy: Secondary | ICD-10-CM

## 2021-02-23 DIAGNOSIS — F324 Major depressive disorder, single episode, in partial remission: Secondary | ICD-10-CM

## 2021-02-23 DIAGNOSIS — E669 Obesity, unspecified: Secondary | ICD-10-CM

## 2021-02-23 DIAGNOSIS — Z13220 Encounter for screening for lipoid disorders: Secondary | ICD-10-CM | POA: Diagnosis not present

## 2021-02-23 DIAGNOSIS — E1169 Type 2 diabetes mellitus with other specified complication: Secondary | ICD-10-CM

## 2021-02-23 DIAGNOSIS — Z79899 Other long term (current) drug therapy: Secondary | ICD-10-CM

## 2021-02-23 DIAGNOSIS — Z1389 Encounter for screening for other disorder: Secondary | ICD-10-CM | POA: Diagnosis not present

## 2021-02-23 DIAGNOSIS — M62830 Muscle spasm of back: Secondary | ICD-10-CM

## 2021-02-23 DIAGNOSIS — Z136 Encounter for screening for cardiovascular disorders: Secondary | ICD-10-CM

## 2021-02-23 DIAGNOSIS — E785 Hyperlipidemia, unspecified: Secondary | ICD-10-CM

## 2021-02-23 DIAGNOSIS — Z0001 Encounter for general adult medical examination with abnormal findings: Secondary | ICD-10-CM

## 2021-02-23 DIAGNOSIS — M25512 Pain in left shoulder: Secondary | ICD-10-CM

## 2021-02-23 LAB — VITAMIN B12: Vitamin B-12: 643 pg/mL (ref 200–1100)

## 2021-02-23 MED ORDER — TIZANIDINE HCL 4 MG PO CAPS: ORAL_CAPSULE | ORAL | 1 refills | Status: DC

## 2021-02-23 MED ORDER — LOSARTAN POTASSIUM 25 MG PO TABS: ORAL_TABLET | ORAL | 3 refills | Status: DC

## 2021-02-23 NOTE — Patient Instructions (Addendum)
°  Danielle Mccall , Thank you for taking time to come for your Annual Wellness Visit. I appreciate your ongoing commitment to your health goals. Please review the following plan we discussed and let me know if I can assist you in the future.   These are the goals we discussed:  Goals      Blood Pressure < 130/80     DIET - DECREASE SODA OR JUICE INTAKE     Exercise 3x per week (30 min per time)     HEMOGLOBIN A1C < 7.0     Weight (lb) < 180 lb (81.6 kg)        This is a list of the screening recommended for you and due dates:  Health Maintenance  Topic Date Due   Zoster (Shingles) Vaccine (1 of 2) Never done   Cologuard (Stool DNA test)  Never done   Mammogram  06/30/2020   Flu Shot  08/15/2020   COVID-19 Vaccine (1) 03/11/2021*   Eye exam for diabetics  03/10/2021   Hemoglobin A1C  04/11/2021   Complete foot exam   02/23/2022   Pap Smear  07/01/2022   Tetanus Vaccine  10/27/2026   Hepatitis C Screening: USPSTF Recommendation to screen - Ages 18-79 yo.  Completed   HIV Screening  Completed   HPV Vaccine  Aged Out  *Topic was postponed. The date shown is not the original due date.      With ozempic sample do 0.25 mg two weeks then 0.5 mg two weeks then start 1 mg/week      Know what a healthy weight is for you (roughly BMI <25) and aim to maintain this  Aim for 7+ servings of fruits and vegetables daily  65-80+ fluid ounces of water or unsweet tea for healthy kidneys  Limit to max 1 drink of alcohol per day; avoid smoking/tobacco  Limit animal fats in diet for cholesterol and heart health - choose grass fed whenever available  Avoid highly processed foods, and foods high in saturated/trans fats  Aim for low stress - take time to unwind and care for your mental health  Aim for 150 min of moderate intensity exercise weekly for heart health, and weights twice weekly for bone health  Aim for 7-9 hours of sleep daily      A great goal to work towards is aiming to get  in a serving daily of some of the most nutritionally dense foods - G- BOMBS daily

## 2021-02-23 NOTE — Progress Notes (Signed)
Complete Physical  Assessment and Plan:  Encounter for Annual Physical Exam with abnormal findings Due annually  Health Maintenance reviewed Healthy lifestyle reviewed and goals set  GYN appointment with Dr. Edwyna Ready, and diabetic eye exam with ophthalmology  Check insurance coverage for shingrix Get flu vaccine at pharmacy this weekend  Prevnar 20 given today   Type 2 diabetes mellitus with hyperglycemia, without long-term current use of insulin ([place]) Currently on metformin 2000 mg daily, ozempic with financial assistance application, given samples to restart taper then resume 1 mg/week Off of jardiance and amaryl She will message about glucose strips for refill  Education: Reviewed ABCs of diabetes management (respective goals in parentheses):  A1C (<7), blood pressure (<130/80), and cholesterol (LDL <70) Eye Exam yearly and Dental Exam every 6 months- requested to forward diabetic eye exam report - reminded to schedule Dietary recommendations Physical Activity recommendations -     COMPLETE METABOLIC PANEL WITH GFR -     Hemoglobin A1c  Hyperlipidemia associated with type 2 diabetes mellitus (HCC) Rosuvastatin 40 mg daily, COMPLIANCE EMPHASIZED WITH POSSIBLE RECENT MI AND HIGH RISK, add zetia if needed LDL goal <70 Continue low cholesterol diet and exercise.  Check lipid panel.  -     Lipid panel -     TSH  Obesity (BMI 30.0-34.9) Long discussion about weight loss, diet, and exercise Recommended diet heavy in fruits and veggies and low in animal meats, cheeses, and dairy products, appropriate calorie intake Discussed appropriate weight for height and initial goal (200 lb) Follow up at next visit Ozempic restart -   Major depressive disorder in partial remission, unspecified whether recurrent ([place]) Doing well with trazodone and therapy  Lifestyle discussed: diet/exerise, sleep hygiene, stress management, hydration  Hypertension Initially elevated, restart ARB for BP and  renal protection Monitor blood pressure at home; call if consistently over 130/80 Continue DASH diet.   Reminder to go to the ER if any CP, SOB, nausea, dizziness, severe HA, changes vision/speech, left arm numbness and tingling and jaw pain.  Medication management -     CBC with Differential/Platelet -     COMPLETE METABOLIC PANEL WITH GFR -     Magnesium  Current intermittent smoker 50+ year smoking hx; back to smoking Discussed risks associated with tobacco use and advised to reduce or quit Patient is ready to do so and plans to taper slowly Declines meds at this time Will follow up at the next visit Start low dose CT scan at age 39  Thickened nails with discoloration of both feet Has failed lamisil x 2; plan to refer to podiatry, but declines at this time   L shoulder pain/chronic lumbar pain - ortho referral per patient request  Edema/venous insufficiency - stable/chronic, NOT NEW, encourage compression, increased walking, elevate when able, low sodium diet - check labs - referral to cardiology with possible MI for perfusion tests and ECHO   Colon cancer screening Colonoscopy- patient declines a colonoscopy even though the risks and benefits were discussed at length. Colon cancer is 3rd most diagnosed cancer and 2nd leading cause of death in both men and women 38 years of age and older. Patient understands the risk of cancer and death with declining the test however they are willing to do cologuard screening instead. They understand that this is not as sensitive or specific as a colonoscopy and they are still recommended to get a colonoscopy. The cologuard will be sent out to their house.   At risk for medication non-compliance  Financial barriers and history of non-compliance She is very receptive and expresses understanding to my emphasis on taking BP, chol, diabetes, ASA meds consistently due to risks Continue to prioritize/consider cost, reduce pill burden Encoraged to  reach out if any barriers and we will assist  Abnormal EKG New T wave inversion and R axis deviation in lateral leads, episode back in Nov 2022 suggestive of possible MI Very high risk - smoker, htn, T2DM poorly controlled, lipids and non-compliance with meds Disucssed at length, encourage smoking cessation, statin, ASA, control sugars, will refer for cardiology evaluation  Advise to avoid NSAIDs as much as able, increases risk Go to the ER if any chest pain, shortness of breath, nausea, dizziness, severe HA, changes vision/speech Also reminded in female and diabetic may not have very pronounced sx; if ANY persistent abnormality contact office for recommendations   Orders Placed This Encounter  Procedures   CBC with Differential/Platelet   COMPLETE METABOLIC PANEL WITH GFR   Magnesium   Lipid panel   TSH   Hemoglobin A1c   VITAMIN D 25 Hydroxy (Vit-D Deficiency, Fractures)   Microalbumin / creatinine urine ratio   Urinalysis, Routine w reflex microscopic   Vitamin B12   Ambulatory referral to Orthopedics   EKG 12-Lead   HM DIABETES FOOT EXAM   Discussed med's effects and SE's. Screening labs and tests as requested with regular follow-up as recommended. Over 40 minutes of exam, counseling, chart review, and complex, high level critical decision making was performed this visit.   Future Appointments  Date Time Provider [place]  02/27/2022  3:00 PM Liane Comber, NP [facility] None    HPI  56 y.o. female  presents for CPE. She has Type 2 diabetes mellitus with hyperglycemia, without long-term current use of insulin ([place]); Hypertension; Hyperlipidemia associated with type 2 diabetes mellitus ([place]); Obesity (BMI 30.0-34.9); Major depression in partial remission ([place]); Discoloration and thickening of nails both feet; Chronic venous insufficiency; Vitamin D deficiency; CKD stage 2 due to type 2 diabetes mellitus ([place]); Diabetic peripheral neuropathy associated with type 2  diabetes mellitus ([place]); TMJ (temporomandibular joint disorder); Current smoker (1.25 pack/day, 50 pack year history) ; Diabetic retinopathy ([place]); Recurrent low back pain episodes; and Financial difficulty on their problem list.   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 Husband recently progressed to ESRD, on medicare and doing dialysis, financially struggling.   She does have hx of recurrent major depression; taking trazodone 50 mg with good results, working with therapist re her husband's situation.   She does have some intermittent mid back pain, takes tizanidine 4 mg at night PRN, also takes rare ibuprofen. Has seen Dr. Louanne Skye for intermittent R shoulder pain, likely impingement, would like referral to see different provider today. Pain limits at job significantly.   She is an intermittent smoker with 50+ pack year history, recently quit again on 10/10/2020 but restarted, 1-1.5 pack currently. She had normal CXR 2019. Denies current respiratory sx. Will plan CT at age 59.   BMI is Body mass index is 32.78 kg/m., she has been working on diet, exercise is limited. 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 Has had limited benefit with phentermine/topamax/welbutrin. Now on ozempic with good resuts but ran out.  Wt Readings from Last 3 Encounters:  02/23/21 212 lb 6.4 oz (96.3 kg)  10/19/20 214 lb 3.2 oz (97.2 kg)  10/12/20 216 lb (98 kg)   She has  not been checking BPs at home, reports was been normal at dentist, Today their BP is BP: 132/66  She does not workout. She denies chest pain, shortness of breath, dizziness in the last few months, but notes back in Nov 2022 had about 1 week of chest tightness/radiating around her back x 1 week. Never got checked out but worries about possible heart attack.    She is on cholesterol medication (didn't tolerate atorvastatin, pravastatin, had stiffness and joint  achiness, taking rosuvastatin 40 mg daily and tolerating, was off last check). Her cholesterol is not at goal. The cholesterol last visit was:   Lab Results  Component Value Date   CHOL 293 (H) 10/12/2020   HDL 77 10/12/2020   LDLCALC 177 (H) 10/12/2020   TRIG 220 (H) 10/12/2020   CHOLHDL 3.8 10/12/2020   She has been working on diet and exercise for T2DM (on metformin ER 1000 mg BID, ozempic 1mg  - was tolerating but ran out), she is not on bASA (receptive to restart), she is not on ACE/ARB, she is on statin and denies foot ulcerations, increased appetite, nausea, polydipsia, polyuria, visual disturbances, vomiting, and weight loss.  She does have mild tingling burning in bil toes.  She does have a meter, out of strips, will check and message back  Last A1C in the office was:  Lab Results  Component Value Date   HGBA1C 9.3 (H) 10/12/2020   Last GFR: Lab Results  Component Value Date   GFRNONAA 80 05/25/2020   Patient is not currently on Vitamin D supplement, unsure of dose. Will check levels today  Lab Results  Component Value Date   VD25OH 36 02/24/2020       Current Medications:  Current Outpatient Medications on File Prior to Visit  Medication Sig Dispense Refill   Cholecalciferol (VITAMIN D3) 250 MCG (10000 UT) TABS Take one tablet daily 30 tablet 0   glipiZIDE (GLUCOTROL) 5 MG tablet Take  1/2 to 1 tablet  2 to 3 x /day  with Meals  for Diaberes 90 tablet 0   ibuprofen (ADVIL) 800 MG tablet Take 1 tablet (800 mg total) by mouth every 8 (eight) hours as needed. Take with food on stomach. 90 tablet 1   Lysine (GNP L-LYSINE) 600 MG TABS Take one tablet daily     Magnesium 500 MG TABS Take one tablet daily 30 tablet    metFORMIN (GLUCOPHAGE-XR) 500 MG 24 hr tablet TAKE 2 TABLETS(1000 MG) BY MOUTH TWICE DAILY WITH A MEAL 360 tablet 1   rosuvastatin (CRESTOR) 40 MG tablet Takes 1 tablet everyday 90 tablet 1   traZODone (DESYREL) 150 MG tablet Take 1/3-1 tablet daily for mood and  sleep. 90 tablet 3   Semaglutide, 1 MG/DOSE, (OZEMPIC, 1 MG/DOSE,) 4 MG/3ML SOPN Inject 1 mg into the skin once a week. (Patient not taking: Reported on 02/23/2021) 9 mL 1   No current facility-administered medications on file prior to visit.   Allergies:  Allergies  Allergen Reactions   Atorvastatin     Myalgias   Buspirone     "felt like I was going to pass out"    Medical History:  She has Type 2 diabetes mellitus with hyperglycemia, without long-term current use of insulin ([place]); Hypertension; Hyperlipidemia associated with type 2 diabetes mellitus ([place]); Obesity (BMI 30.0-34.9); Major depression in partial remission ([place]); Discoloration and thickening of nails both feet; Chronic venous insufficiency; Vitamin D deficiency; CKD stage 2 due to type 2 diabetes mellitus ([place]); Diabetic  peripheral neuropathy associated with type 2 diabetes mellitus ([place]); TMJ (temporomandibular joint disorder); Current smoker (1.25 pack/day, 50 pack year history) ; Diabetic retinopathy ([place]); Recurrent low back pain episodes; and Financial difficulty on their problem list.   Health Maintenance:   Immunization History  Administered Date(s) Administered   Influenza,inj,Quad PF,6+ Mos 12/28/2016   Tdap 10/26/2016   Health Maintenance  Topic Date Due   COVID-19 Vaccine (1) Never done   Zoster Vaccines- Shingrix (1 of 2) Never done   Fecal DNA (Cologuard)  Never done   INFLUENZA VACCINE  08/15/2020   URINE MICROALBUMIN  02/23/2021   OPHTHALMOLOGY EXAM  03/10/2021   HEMOGLOBIN A1C  04/11/2021   MAMMOGRAM  06/30/2021   FOOT EXAM  02/23/2022   PAP SMEAR-Modifier  07/01/2022   TETANUS/TDAP  10/27/2026   Hepatitis C Screening  Completed   HIV Screening  Completed   HPV VACCINES  Aged Out   Tetanus: 2018 Prevnar 20: TODAY Flu vaccine: will get at pharmacy  Shingrix: check insurance coverage  Covid 19: declines   LMP: No LMP recorded. Patient is postmenopausal. Pap: Has GYN - Dr. Charlotte Crumb -  [facility], annually  MGM:  by Dr. Edwyna Ready,  DEXA: -   Colonoscopy: DUE - cologuard was ordered but never completed - requests to send again  EGD: n/a  Last Dental Exam: [facility], last exam 2022 has upcoming  Last Eye Exam: Last 03/10/2020 - retinopathy   Patient Care Team: Unk Pinto, MD as PCP - General (Internal Medicine) Jessy Oto, MD as Consulting Physician (Orthopedic Surgery)  Surgical History:  She has a past surgical history that includes Cholecystectomy (2005) and Cesarean section (2000). Family History:  Herfamily history includes Renal Disease in her son. She was adopted. Social History:  She reports that she has been smoking cigarettes. She started smoking about 40 years ago. She has a 50.00 pack-year smoking history. She has never used smokeless tobacco. She reports current alcohol use. She reports that she does not use drugs.   Review of Systems: Review of Systems  Constitutional:  Negative for malaise/fatigue and weight loss.  HENT:  Negative for hearing loss and tinnitus.   Eyes:  Negative for blurred vision and double vision.  Respiratory:  Negative for cough, shortness of breath and wheezing.   Cardiovascular:  Negative for chest pain, palpitations, orthopnea, claudication and leg swelling (chronic, mild, bil).  Gastrointestinal:  Negative for abdominal pain, blood in stool, constipation, diarrhea, heartburn, melena, nausea and vomiting.  Genitourinary: Negative.   Musculoskeletal:  Positive for back pain (lumbar, ortho following) and joint pain (L shoulder). Negative for myalgias.  Skin:  Negative for rash.  Neurological:  Positive for tingling (bil feet). Negative for dizziness, sensory change, weakness and headaches.  Endo/Heme/Allergies:  Negative for polydipsia.  Psychiatric/Behavioral:  Negative for depression, hallucinations, substance abuse and suicidal ideas. The patient is not nervous/anxious and does not have insomnia.   All other systems  reviewed and are negative.  Physical Exam: Estimated body mass index is 32.78 kg/m as calculated from the following:   Height as of this encounter: 5' 7.5" (1.715 m).   Weight as of this encounter: 212 lb 6.4 oz (96.3 kg). BP 132/66    Pulse 78    Temp 97.7 F (36.5 C)    Ht 5' 7.5" (1.715 m)    Wt 212 lb 6.4 oz (96.3 kg)    SpO2 99%    BMI 32.78 kg/m  General Appearance: Well nourished, appears older  than stated age, in no apparent distress.  Eyes: PERRLA, EOMs, conjunctiva no swelling or erythema Sinuses: No Frontal/maxillary tenderness  ENT/Mouth: Ext aud canals clear, normal light reflex with TMs without erythema, bulging. Good dentition. No erythema, swelling, or exudate on post pharynx. Tonsils not swollen or erythematous. Hearing normal. Missing extensive teeth.  Neck: Supple, thyroid normal. No bruits  Respiratory: Respiratory effort normal, BS equal bilaterally without rales, rhonchi, wheezing or stridor.  Cardio: RRR without murmurs, rubs or gallops. Brisk peripheral pulses with 1+ bilateral non-pitting edema to ankles.  Chest: symmetric, with normal excursions and percussion.  Breasts: Defer to GYN Abdomen: Soft, nontender, no guarding, rebound, hernias, masses, or organomegaly.  Lymphatics: Non tender without lymphadenopathy.  Genitourinary: Defer to GYN Musculoskeletal: Full ROM bil lower extremities, 5/5 strength, and normal gait. L shoulder with limited abduction and external rotation with pain.  Skin: Warm, dry without rashes, lesions, ecchymosis. Bilateral toe nails yellowed, brittle distally.  Neuro: Cranial nerves intact, reflexes equal bilaterally. Normal muscle tone, no cerebellar symptoms. Sensation intact to monofilament bilaterally.  Psych: Awake and oriented X 3, normal affect, Insight and Judgment appropriate.   EKG: NSR, New T wave inversion in leads I, aVR, aVL with right axis deviation + 160, possible MI  Izora Ribas 3:37 PM [facility]

## 2021-02-24 ENCOUNTER — Other Ambulatory Visit: Payer: Self-pay | Admitting: Adult Health

## 2021-02-24 ENCOUNTER — Other Ambulatory Visit: Payer: Self-pay

## 2021-02-24 LAB — CBC WITH DIFFERENTIAL/PLATELET
Absolute Monocytes: 725 cells/uL (ref 200–950)
Basophils Absolute: 129 cells/uL (ref 0–200)
Basophils Relative: 1.1 %
Eosinophils Absolute: 480 cells/uL (ref 15–500)
Eosinophils Relative: 4.1 %
HCT: 37 % (ref 35.0–45.0)
Hemoglobin: 12.2 g/dL (ref 11.7–15.5)
Lymphs Abs: 3229 cells/uL (ref 850–3900)
MCH: 29.9 pg (ref 27.0–33.0)
MCHC: 33 g/dL (ref 32.0–36.0)
MCV: 90.7 fL (ref 80.0–100.0)
MPV: 10.5 fL (ref 7.5–12.5)
Monocytes Relative: 6.2 %
Neutro Abs: 7137 cells/uL (ref 1500–7800)
Neutrophils Relative %: 61 %
Platelets: 353 10*3/uL (ref 140–400)
RBC: 4.08 10*6/uL (ref 3.80–5.10)
RDW: 12.3 % (ref 11.0–15.0)
Total Lymphocyte: 27.6 %
WBC: 11.7 10*3/uL — ABNORMAL HIGH (ref 3.8–10.8)

## 2021-02-24 LAB — HEMOGLOBIN A1C
Hgb A1c MFr Bld: 9.6 % of total Hgb — ABNORMAL HIGH (ref <5.7)
Mean Plasma Glucose: 229 mg/dL
eAG (mmol/L): 12.7 mmol/L

## 2021-02-24 LAB — LIPID PANEL
Cholesterol: 183 mg/dL (ref <200)
HDL: 80 mg/dL (ref >=50)
LDL Cholesterol (Calc): 79 mg/dL (calc)
Non-HDL Cholesterol (Calc): 103 mg/dL (calc) (ref <130)
Total CHOL/HDL Ratio: 2.3 (calc) (ref <5.0)
Triglycerides: 139 mg/dL (ref <150)

## 2021-02-24 LAB — URINALYSIS, ROUTINE W REFLEX MICROSCOPIC
Bilirubin Urine: NEGATIVE
Hgb urine dipstick: NEGATIVE
Ketones, ur: NEGATIVE
Leukocytes,Ua: NEGATIVE
Nitrite: NEGATIVE
Protein, ur: NEGATIVE
Specific Gravity, Urine: 1.028 (ref 1.001–1.035)
pH: 7 (ref 5.0–8.0)

## 2021-02-24 LAB — COMPLETE METABOLIC PANEL WITH GFR
AG Ratio: 1.8 (calc) (ref 1.0–2.5)
ALT: 16 U/L (ref 6–29)
AST: 15 U/L (ref 10–35)
Albumin: 4 g/dL (ref 3.6–5.1)
Alkaline phosphatase (APISO): 76 U/L (ref 37–153)
BUN: 21 mg/dL (ref 7–25)
CO2: 28 mmol/L (ref 20–32)
Calcium: 9.2 mg/dL (ref 8.6–10.4)
Chloride: 101 mmol/L (ref 98–110)
Creat: 0.79 mg/dL (ref 0.50–1.03)
Globulin: 2.2 g/dL (calc) (ref 1.9–3.7)
Glucose, Bld: 290 mg/dL — ABNORMAL HIGH (ref 65–99)
Potassium: 4.6 mmol/L (ref 3.5–5.3)
Sodium: 137 mmol/L (ref 135–146)
Total Bilirubin: 0.3 mg/dL (ref 0.2–1.2)
Total Protein: 6.2 g/dL (ref 6.1–8.1)
eGFR: 89 mL/min/{1.73_m2} (ref >=60)

## 2021-02-24 LAB — VITAMIN D 25 HYDROXY (VIT D DEFICIENCY, FRACTURES): Vit D, 25-Hydroxy: 49 ng/mL (ref 30–100)

## 2021-02-24 LAB — TSH: TSH: 1.45 mIU/L

## 2021-02-24 LAB — MICROALBUMIN / CREATININE URINE RATIO
Creatinine, Urine: 85 mg/dL (ref 20–275)
Microalb Creat Ratio: 19 mcg/mg creat (ref <30)
Microalb, Ur: 1.6 mg/dL

## 2021-02-24 LAB — MAGNESIUM: Magnesium: 1.9 mg/dL (ref 1.5–2.5)

## 2021-02-24 MED ORDER — OZEMPIC (1 MG/DOSE) 4 MG/3ML ~~LOC~~ SOPN: 1.0000 mg | PEN_INJECTOR | SUBCUTANEOUS | 1 refills | Status: DC

## 2021-02-24 MED ORDER — EZETIMIBE 10 MG PO TABS: ORAL_TABLET | ORAL | 3 refills | Status: DC

## 2021-02-25 ENCOUNTER — Other Ambulatory Visit: Payer: Self-pay | Admitting: Adult Health

## 2021-02-25 DIAGNOSIS — E1165 Type 2 diabetes mellitus with hyperglycemia: Secondary | ICD-10-CM

## 2021-02-25 MED ORDER — ONETOUCH VERIO VI STRP: ORAL_STRIP | 12 refills | Status: DC

## 2021-03-07 ENCOUNTER — Other Ambulatory Visit: Payer: Self-pay | Admitting: Internal Medicine

## 2021-03-07 DIAGNOSIS — E1165 Type 2 diabetes mellitus with hyperglycemia: Secondary | ICD-10-CM

## 2021-03-07 MED ORDER — GLIPIZIDE 5 MG PO TABS: ORAL_TABLET | ORAL | 0 refills | Status: DC

## 2021-03-15 ENCOUNTER — Encounter: Payer: Self-pay | Admitting: Interventional Cardiology

## 2021-03-15 ENCOUNTER — Encounter: Payer: Self-pay | Admitting: Adult Health

## 2021-03-15 ENCOUNTER — Ambulatory Visit: Payer: BC Managed Care – PPO | Admitting: Interventional Cardiology

## 2021-03-15 ENCOUNTER — Other Ambulatory Visit: Payer: Self-pay

## 2021-03-15 VITALS — BP 126/64 | HR 86 | Ht 68.0 in | Wt 211.0 lb

## 2021-03-15 DIAGNOSIS — E782 Mixed hyperlipidemia: Secondary | ICD-10-CM | POA: Diagnosis not present

## 2021-03-15 DIAGNOSIS — E669 Obesity, unspecified: Secondary | ICD-10-CM

## 2021-03-15 DIAGNOSIS — Z720 Tobacco use: Secondary | ICD-10-CM

## 2021-03-15 DIAGNOSIS — I10 Essential (primary) hypertension: Secondary | ICD-10-CM

## 2021-03-15 DIAGNOSIS — R072 Precordial pain: Secondary | ICD-10-CM

## 2021-03-15 DIAGNOSIS — E118 Type 2 diabetes mellitus with unspecified complications: Secondary | ICD-10-CM | POA: Diagnosis not present

## 2021-03-15 MED ORDER — METOPROLOL TARTRATE 100 MG PO TABS: ORAL_TABLET | ORAL | 0 refills | Status: DC

## 2021-03-15 NOTE — Progress Notes (Signed)
?  ?Cardiology Office Note ? ? ?Date:  03/15/2021  ? ?ID:  Danielle Mccall, DOB 08/14/1965, MRN [id number] ? ?PCP:  Lucky Cowboy, MD  ? ? ?Chief Complaint  ?Patient presents with  ? Establish Care  ? ?Abnormal ECG ? ?Wt Readings from Last 3 Encounters:  ?03/15/21 211 lb (95.7 kg)  ?02/23/21 212 lb 6.4 oz (96.3 kg)  ?10/19/20 214 lb 3.2 oz (97.2 kg)  ?  ? ?  ?History of Present Illness: ?Danielle Mccall is a 56 y.o. female who is being seen today for the evaluation of abnormal ECG at the request of Judd Gaudier, NP.  ? ?She had an ECG on February 23, 2021 which was read as abnormal.  There were T wave inversions noted in 1 and aVL.  Closer inspection of the ECG shows that lead I as a negative QRS and T wave that is inverted.  aVR has a positive QRS.  Likely lead reversal.  ? ?On 2/12, she had back pain radiating to the chest that was a squeezing pain.  Associated sweating and feeling hot.  Sx lasted 8-10 minutes.  She stood and hoped it would go away.  No particular trigger.   ? ? ? ?Past Medical History:  ?Diagnosis Date  ? Diabetes mellitus without complication (HCC)   ? Leg pain, left   ? Statin intolerance 03/28/2018  ? ? ?Past Surgical History:  ?Procedure Laterality Date  ? CESAREAN SECTION  2000  ? CHOLECYSTECTOMY  2005  ? ? ? ?Current Outpatient Medications  ?Medication Sig Dispense Refill  ? aspirin EC 81 MG tablet Take 81 mg by mouth daily. Swallow whole.    ? Cholecalciferol (VITAMIN D3) 250 MCG (10000 UT) TABS Take one tablet daily 30 tablet 0  ? ezetimibe (ZETIA) 10 MG tablet Take 1 tab daily for cholesterol and heart attack risk reduction. 90 tablet 3  ? glipiZIDE (GLUCOTROL) 5 MG tablet Take  1/2 to 1 tablet  2 to 3 x /day  with Meals  for Diaberes 90 tablet 0  ? glucose blood (ONETOUCH VERIO) test strip Check fasting sugar daily (prior to breakfast). 100 each 12  ? losartan (COZAAR) 25 MG tablet Take 1 tab daily for blood pressure and kidney protection. 90 tablet 3  ? Lysine (GNP L-LYSINE) 600  MG TABS Take one tablet daily    ? Magnesium 500 MG TABS Take one tablet daily 30 tablet   ? metFORMIN (GLUCOPHAGE-XR) 500 MG 24 hr tablet TAKE 2 TABLETS(1000 MG) BY MOUTH TWICE DAILY WITH A MEAL 360 tablet 1  ? rosuvastatin (CRESTOR) 40 MG tablet Takes 1 tablet everyday 90 tablet 1  ? Semaglutide, 1 MG/DOSE, (OZEMPIC, 1 MG/DOSE,) 4 MG/3ML SOPN Inject 1 mg into the skin once a week. 9 mL 1  ? tiZANidine (ZANAFLEX) 4 MG capsule TAKE 1 CAPSULE BY MOUTH 3 TIMES DAILY AS NEEDED FOR MUSCLE SPASMS. 270 capsule 1  ? traZODone (DESYREL) 150 MG tablet Take 1/3-1 tablet daily for mood and sleep. 90 tablet 3  ? ?No current facility-administered medications for this visit.  ? ? ?Allergies:   Atorvastatin, Buspirone, and Pravastatin  ? ? ?Social History:  The patient  reports that she has been smoking cigarettes. She started smoking about 40 years ago. She has a 50.00 pack-year smoking history. She has never used smokeless tobacco. She reports current alcohol use. She reports that she does not use drugs.  ? ?Family History:  The patient's family history includes Renal Disease in  her son. She was adopted.  ? ? ?ROS:  Please see the history of present illness.   Otherwise, review of systems are positive for difficulty losing weight.   All other systems are reviewed and negative.  ? ? ?PHYSICAL EXAM: ?VS:  BP 126/64   Pulse 86   Ht 5\' 8"  (1.727 m)   Wt 211 lb (95.7 kg)   SpO2 98%   BMI 32.08 kg/m?  , BMI Body mass index is 32.08 kg/m?. ?GEN: Well nourished, well developed, in no acute distress ?HEENT: normal ?Neck: no JVD, carotid bruits, or masses ?Cardiac: RRR; no murmurs, rubs, or gallops,no edema  ?Respiratory:  clear to auscultation bilaterally, normal work of breathing ?GI: soft, nontender, nondistended, + BS ?MS: no deformity or atrophy ?Skin: warm and dry, no rash ?Neuro:  Strength and sensation are intact ?Psych: euthymic mood, full affect ? ? ?EKG:   ?The ekg ordered today demonstrates normal sinus rhythm, with  occasional PVCs, otherwise normal ECG ? ? ?Recent Labs: ?02/23/2021: ALT 16; BUN 21; Creat 0.79; Hemoglobin 12.2; Magnesium 1.9; Platelets 353; Potassium 4.6; Sodium 137; TSH 1.45  ? ?Lipid Panel ?   ?Component Value Date/Time  ? CHOL 183 02/23/2021 1609  ? CHOL 171 12/28/2016 1618  ? TRIG 139 02/23/2021 1609  ? HDL 80 02/23/2021 1609  ? HDL 60 12/28/2016 1618  ? CHOLHDL 2.3 02/23/2021 1609  ? LDLCALC 79 02/23/2021 1609  ? ?  ?Other studies Reviewed: ?Additional studies/ records that were reviewed today with results demonstrating: Labs reviewed. ? ? ?ASSESSMENT AND PLAN: ? ?Precordial chest pain: Plan for CTA coronaries.  Use metoprolol 100 mg x 1.  Several risk factors for CAD including tobacco abuse and diabetes.  Given the episode she had, need to rule out obstructive coronary artery disease.  ECG normalized today.  I suspect the ECG from February 9 showed limb lead reversal. ?DM2: A1c 9.6.  Whole food, plant-based diet.  Eat lots of fiber.  Avoid processed foods.  Avoid concentrated sweets.  Stressed importance of compliance with meds.  ?Hyperlipidemia:on a low dose and tolerating. TC 183, HDL 80, LDL 79, TG 139.Rosuvastatin daily and ezetimibe daily. ?HTN: The current medical regimen is effective;  continue present plan and medications.   ?Obesity: Losing 5 to 10 lbs.  Can make a difference in blood pressure and blood sugar.  LE edema.  Leg elevation and compression stockings.  Worse with eating processed foods.  ?TObacco abuse: Needs to stop smoking.  ? ? ?Current medicines are reviewed at length with the patient today.  The patient concerns regarding her medicines were addressed. ? ?The following changes have been made:  No change ? ?Labs/ tests ordered today include:  ?No orders of the defined types were placed in this encounter. ? ? ?Recommend 150 minutes/week of aerobic exercise ?Low fat, low carb, high fiber diet recommended ? ?Disposition:   FU in 1 year, or sooner if CTA is abnormal ? ? ?Signed, ?12-06-1979, MD  ?03/15/2021 2:35 PM    ?[facility] ?[address] [place], [place], [place]  [place] ?Phone: [phone]; Fax: [phone]  ? ?

## 2021-03-15 NOTE — Patient Instructions (Addendum)
Medication Instructions:  ?Your physician recommends that you continue on your current medications as directed. Please refer to the Current Medication list given to you today.  ?*If you need a refill on your cardiac medications before your next appointment, please call your pharmacy* ? ? ?Lab Work: ?Lab work to be done today--BMP ?If you have labs (blood work) drawn today and your tests are completely normal, you will receive your results only by: ?MyChart Message (if you have MyChart) OR ?A paper copy in the mail ?If you have any lab test that is abnormal or we need to change your treatment, we will call you to review the results. ? ? ?Testing/Procedures: ?Your physician has requested that you have cardiac CT. Cardiac computed tomography (CT) is a painless test that uses an x-ray machine to take clear, detailed pictures of your heart. For further information please visit [email or web address] Please follow instruction sheet as given. ? ? ? ? ?Follow-Up: ?At [facility], you and your health needs are our priority.  As part of our continuing mission to provide you with exceptional heart care, we have created designated Provider Care Teams.  These Care Teams include your primary Cardiologist (physician) and Advanced Practice Providers (APPs -  Physician Assistants and Nurse Practitioners) who all work together to provide you with the care you need, when you need it. ? ?We recommend signing up for the patient portal called "MyChart".  Sign up information is provided on this After Visit Summary.  MyChart is used to connect with patients for Virtual Visits (Telemedicine).  Patients are able to view lab/test results, encounter notes, upcoming appointments, etc.  Non-urgent messages can be sent to your provider as well.   ?To learn more about what you can do with MyChart, go to [email or web address].   ? ?Your next appointment:   ?12 month(s) ? ?The format for your next appointment:   ?In Person ? ?Provider:    ?Lance Muss, MD   ? ? ?Other Instructions ? ? ?Your cardiac CT will be scheduled at one of the below locations:  ? ?[facility] ?[address] ?[place], [place] [address] ?[phone] ? ?OR ? ?[facility] ?[address] ?[address] ?[place], [place] [address] ?[phone] ? ?If scheduled at [facility], please arrive at the [facility] (Entrance C2) of [facility] 30 minutes prior to test start time. ?You can use the FREE valet parking offered at entrance C (encouraged to control the heart rate for the test)  ?Proceed to the [facility] Radiology Department (first floor) to check-in and test prep. ? ?All radiology patients and guests should use entrance C2 at [facility], accessed from [facility], even though the hospital's physical address listed is [address] ? ? ? ?If scheduled at [facility], please arrive 15 mins early for check-in and test prep. ? ?Please follow these instructions carefully (unless otherwise directed): ? ? ? ?On the Night Before the Test: ?Be sure to Drink plenty of water. ?Do not consume any caffeinated/decaffeinated beverages or chocolate 12 hours prior to your test. ?Do not take any antihistamines 12 hours prior to your test. ? ?On the Day of the Test: ?Drink plenty of water until 1 hour prior to the test. ?Do not eat any food 4 hours prior to the test. ?You may take your regular medications prior to the test.  ?Take metoprolol (Lopressor) two hours prior to test. ?HOLD Furosemide/Hydrochlorothiazide morning  of the test. ?FEMALES- please wear underwire-free bra if available, avoid dresses & tight clothing ?   ? ?     ?After the Test: ?Drink plenty of water. ?After receiving IV contrast, you may experience a mild flushed feeling. This is normal. ?On occasion, you may experience a mild rash up to 24 hours after the test. This is not  dangerous. If this occurs, you can take Benadryl 25 mg and increase your fluid intake. ?If you experience trouble breathing, this can be serious. If it is severe call 911 IMMEDIATELY. If it is mild, please call our office. ?If you take any of these medications: Glipizide/Metformin, Avandament, Glucavance, please do not take 48 hours after completing test unless otherwise instructed. ? ?We will call to schedule your test 2-4 weeks out understanding that some insurance companies will need an authorization prior to the service being performed.  ? ?For non-scheduling related questions, please contact the cardiac imaging nurse navigator should you have any questions/concerns: ?Rockwell Alexandria, Cardiac Imaging Nurse Navigator ?Larey Brick, Cardiac Imaging Nurse Navigator ?[facility] ?Direct Office Dial: [phone]  ? ?For scheduling needs, including cancellations and rescheduling, please call [place], [phone]. ? ? ?

## 2021-03-16 ENCOUNTER — Other Ambulatory Visit: Payer: Self-pay | Admitting: Adult Health

## 2021-03-16 DIAGNOSIS — F17200 Nicotine dependence, unspecified, uncomplicated: Secondary | ICD-10-CM

## 2021-03-16 LAB — BASIC METABOLIC PANEL
BUN/Creatinine Ratio: 24 — ABNORMAL HIGH (ref 9–23)
BUN: 20 mg/dL (ref 6–24)
CO2: 26 mmol/L (ref 20–29)
Calcium: 9.7 mg/dL (ref 8.7–10.2)
Chloride: 100 mmol/L (ref 96–106)
Creatinine, Ser: 0.82 mg/dL (ref 0.57–1.00)
Glucose: 315 mg/dL — ABNORMAL HIGH (ref 70–99)
Potassium: 4.8 mmol/L (ref 3.5–5.2)
Sodium: 140 mmol/L (ref 134–144)
eGFR: 85 mL/min/{1.73_m2} (ref >59)

## 2021-03-16 MED ORDER — VARENICLINE TARTRATE 1 MG PO TABS: 1.0000 mg | ORAL_TABLET | Freq: Two times a day (BID) | ORAL | 5 refills | Status: DC

## 2021-03-16 MED ORDER — ROSUVASTATIN CALCIUM 40 MG PO TABS: ORAL_TABLET | ORAL | 3 refills | Status: DC

## 2021-03-27 ENCOUNTER — Telehealth (HOSPITAL_COMMUNITY): Payer: Self-pay | Admitting: Emergency Medicine

## 2021-03-27 ENCOUNTER — Encounter (HOSPITAL_COMMUNITY): Payer: Self-pay

## 2021-03-27 NOTE — Telephone Encounter (Signed)
Reaching out to patient to offer assistance regarding upcoming cardiac imaging study; pt verbalizes understanding of appt date/time, parking situation and where to check in, pre-test NPO status and medications ordered, and verified current allergies; name and call back number provided for further questions should they arise ?Rockwell Alexandria RN Navigator Cardiac Imaging ? Heart and Vascular ?[phone] office ?[phone] cell ? ? ?Denies iv issues ?100mg  metoprolol tartrate  ?Arrival 1230 ? ?

## 2021-03-28 ENCOUNTER — Other Ambulatory Visit: Payer: Self-pay

## 2021-03-28 ENCOUNTER — Ambulatory Visit (HOSPITAL_COMMUNITY)
Admission: RE | Admit: 2021-03-28 | Discharge: 2021-03-28 | Disposition: A | Discharge status: Home / Self Care | Payer: BC Managed Care – PPO | Source: Ambulatory Visit | Attending: Interventional Cardiology | Admitting: Interventional Cardiology

## 2021-03-28 DIAGNOSIS — R072 Precordial pain: Secondary | ICD-10-CM

## 2021-03-28 MED ORDER — NITROGLYCERIN 0.4 MG SL SUBL
SUBLINGUAL_TABLET | SUBLINGUAL | Status: AC
Start: 2021-03-28 — End: 2021-03-28
  Administered 2021-03-28: 0.8 mg via SUBLINGUAL
  Filled 2021-03-28: qty 2

## 2021-03-28 MED ORDER — NITROGLYCERIN 0.4 MG SL SUBL: 0.8000 mg | SUBLINGUAL_TABLET | Freq: Once | SUBLINGUAL | Status: AC

## 2021-03-28 MED ORDER — IOHEXOL 350 MG/ML SOLN
95.0000 mL | Freq: Once | INTRAVENOUS | Status: AC | PRN
  Administered 2021-03-28: 95 mL via INTRAVENOUS

## 2021-03-29 ENCOUNTER — Encounter: Payer: Self-pay | Admitting: Interventional Cardiology

## 2021-03-30 NOTE — Telephone Encounter (Signed)
I spoke with patient and reviewed cardiac CT results with her  

## 2021-04-26 ENCOUNTER — Ambulatory Visit (INDEPENDENT_AMBULATORY_CARE_PROVIDER_SITE_OTHER): Payer: BC Managed Care – PPO | Admitting: Adult Health

## 2021-04-26 ENCOUNTER — Encounter: Payer: Self-pay | Admitting: Adult Health

## 2021-04-26 VITALS — BP 142/76 | HR 77 | Temp 97.7°F | Wt 210.0 lb

## 2021-04-26 DIAGNOSIS — E1165 Type 2 diabetes mellitus with hyperglycemia: Secondary | ICD-10-CM

## 2021-04-26 DIAGNOSIS — I2510 Atherosclerotic heart disease of native coronary artery without angina pectoris: Secondary | ICD-10-CM

## 2021-04-26 DIAGNOSIS — R918 Other nonspecific abnormal finding of lung field: Secondary | ICD-10-CM

## 2021-04-26 DIAGNOSIS — F17200 Nicotine dependence, unspecified, uncomplicated: Secondary | ICD-10-CM | POA: Diagnosis not present

## 2021-04-26 DIAGNOSIS — M25511 Pain in right shoulder: Secondary | ICD-10-CM

## 2021-04-26 DIAGNOSIS — G8929 Other chronic pain: Secondary | ICD-10-CM

## 2021-04-26 MED ORDER — MELOXICAM 15 MG PO TABS: ORAL_TABLET | ORAL | 1 refills | Status: DC

## 2021-04-26 MED ORDER — OZEMPIC (1 MG/DOSE) 4 MG/3ML ~~LOC~~ SOPN: 1.0000 mg | PEN_INJECTOR | SUBCUTANEOUS | 1 refills | Status: DC

## 2021-04-26 MED ORDER — VARENICLINE TARTRATE 1 MG PO TABS: ORAL_TABLET | ORAL | 5 refills | Status: DC

## 2021-04-26 NOTE — Progress Notes (Signed)
Assessment and Plan: ? ?Danielle Mccall was seen today for results. ? ?Diagnoses and all orders for this visit: ? ?Pulmonary nodules ?Small ~3 mm, plan 1 year follow up CT ?Work on smoking cessation - chantix  ? ?Chronic right shoulder pain ?Suspect impingement/rotator cuff tendinopathy ?Start meloxicam, transition to topical NSAID ?Ortho referral  ?-     meloxicam (MOBIC) 15 MG tablet; Take one daily with food for 2 weeks, can take with tylenol, can not take with aleve, iburpofen, then as needed daily for pain ?-     Ambulatory referral to Orthopedics ? ?Type 2 diabetes mellitus with hyperglycemia, without long-term current use of insulin (HCC) ?Assistance paperwork completed to submit today ?-     Semaglutide, 1 MG/DOSE, (OZEMPIC, 1 MG/DOSE,) 4 MG/3ML SOPN; Inject 1 mg into the skin once a week. ? ?Current smoker (1.25 pack/day, 50 pack year history)  ?Discussed risks associated with tobacco use and advised to reduce or quit ?Patient is ready to do so  ?Prescription for chantix with information provided ?Will follow up at the next visit  ?-     varenicline (CHANTIX CONTINUING MONTH PAK) 1 MG tablet; Take 1/2-1 tab twice daily as directed for smoking cessation. ? ?Coronary artery calcification seen on CT scan ?Mild, recent coronary CT results with minimal obstruction <24% reviewed ?Will work on risk reduction ?Smoking cessation, LDL goal <70, htn <130/80, glucose control A1C <6.5% if possible  ? ? ?Further disposition pending results of labs. Discussed med's effects and SE's.   ?Over 30 minutes of exam, counseling, chart review, and critical decision making was performed.  ? ?Future Appointments  ?Date Time Provider Department Center  ?06/08/2021  4:00 PM Judd Gaudier, NP [facility] None  ?02/27/2022  3:00 PM Judd Gaudier, NP [facility] None  ? ? ?------------------------------------------------------------------------------------------------------------------ ? ? ?HPI ?BP (!) 142/76   Pulse 77   Temp 97.7 ?F (36.5  ?C)   Wt 210 lb (95.3 kg)   SpO2 99%   BMI 31.93 kg/m?  ?56 y.o.female smoker with T2DM, htn, hld presents requesting discussion of recent coronary CT results, referral for chronic R shoulder pain, assistance with ozempic management.  ? ?Patient was referred to cardiology due to report of chest pain episode with sig coronary risk factors, possible EKG changes. She underwent coronary CT by Dr. Eldridge Dace 03/28/2021 that showed CAD-RADS 1, minimal, recommended for medication management and risk factor reduction.  ? ?Did incidentally show some R sided pulm nodules ~3 mm, recommended for follow up if risk factors. She is an intermittent smoker with 50+ pack year history, recently quit again on 10/10/2020 but restarted, 1-1.5 pack currently. She had normal CXR 2019. Denies current respiratory sx. She is motivated to try quitting and interested in chantix.  ? ?Her blood pressure has been controlled at home, today their BP is BP: (!) 142/76 ? She does not workout. She denies chest pain, shortness of breath, dizziness. ? ? She is on cholesterol medication (rosuvastatin 40 mg and zetia 10 mg, working on med compliance) and denies myalgias. Her cholesterol is not at goal. The cholesterol last visit was:   ?Lab Results  ?Component Value Date  ? CHOL 183 02/23/2021  ? HDL 80 02/23/2021  ? LDLCALC 79 02/23/2021  ? TRIG 139 02/23/2021  ? CHOLHDL 2.3 02/23/2021  ? ? She has been working on diet and exercise for T2DM on metformin, glipizide, and has done well with ozempic by sample but needs financial assistance (paperwork completed today) and denies increased appetite, nausea, paresthesia  of the feet, polydipsia, and polyuria. Last A1C in the office was:  ?Lab Results  ?Component Value Date  ? HGBA1C 9.6 (H) 02/23/2021  ? 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  ? ?Past Medical History:  ?Diagnosis Date  ? Diabetes mellitus without complication ([place])   ? Leg pain, left   ? Statin intolerance 03/28/2018  ?  ? ?Allergies  ?Allergen Reactions  ? Atorvastatin   ?  Myalgias  ? Buspirone   ?  "felt like I was going to pass out"   ? Pravastatin   ? ? ?Current Outpatient Medications on File Prior to Visit  ?Medication Sig  ? aspirin EC 81 MG tablet Take 81 mg by mouth daily. Swallow whole.  ? Cholecalciferol (VITAMIN D3) 250 MCG (10000 UT) TABS Take one tablet daily  ? ezetimibe (ZETIA) 10 MG tablet Take 1 tab daily for cholesterol and heart attack risk reduction.  ? glipiZIDE (GLUCOTROL) 5 MG tablet Take  1/2 to 1 tablet  2 to 3 x /day  with Meals  for Diaberes  ? glucose blood (ONETOUCH VERIO) test strip Check fasting sugar daily (prior to breakfast).  ? losartan (COZAAR) 25 MG tablet Take 1 tab daily for blood pressure and kidney protection.  ? Lysine (GNP L-LYSINE) 600 MG TABS Take one tablet daily  ? Magnesium 500 MG TABS Take one tablet daily  ? metFORMIN (GLUCOPHAGE-XR) 500 MG 24 hr tablet TAKE 2 TABLETS(1000 MG) BY MOUTH TWICE DAILY WITH A MEAL  ? metoprolol tartrate (LOPRESSOR) 100 MG tablet Take one tablet by mouth 2 hours prior to CT Scan  ? rosuvastatin (CRESTOR) 40 MG tablet Takes 1 tablet everyday  ? tiZANidine (ZANAFLEX) 4 MG capsule TAKE 1 CAPSULE BY MOUTH 3 TIMES DAILY AS NEEDED FOR MUSCLE SPASMS.  ? traZODone (DESYREL) 150 MG tablet Take 1/3-1 tablet daily for mood and sleep.  ? ?No current facility-administered medications on file prior to visit.  ? ? ?ROS: all negative except above.  ? ?Physical Exam: ? ?BP (!) 142/76   Pulse 77   Temp 97.7 ?F (36.5 ?C)   Wt 210 lb (95.3 kg)   SpO2 99%   BMI 31.93 kg/m?  ? ?General Appearance: Well nourished, in no apparent distress. ?Eyes: PERRLA, EOMs, conjunctiva no swelling or erythema ?Sinuses: No Frontal/maxillary tenderness ?ENT/Mouth: Ext aud  canals clear, TMs without erythema, bulging. No erythema, swelling, or exudate on post pharynx.  Tonsils not swollen or erythematous. Hearing normal.  ?Neck: Supple, thyroid normal.  ?Respiratory: Respiratory effort normal, BS equal bilaterally without rales, rhonchi, wheezing or stridor.  ?Cardio: RRR with no MRGs. Brisk peripheral pulses without edema.  ?Abdomen: Soft, + BS.  Non tender, no guarding, rebound, hernias, masses. ?Lymphatics: Non tender without lymphadenopathy.  ?Musculoskeletal: Full ROM excepting R shoulder (pain with active ROM lateral abduction past 100 degrees and limited internal rotation, 5/5 strength, normal gait.  ?Skin: Warm, dry without rashes, lesions, ecchymosis.  ?Neuro: Cranial nerves intact. Normal muscle tone, no cerebellar symptoms. Sensation intact.  ?Psych: Awake and oriented X 3, normal affect, Insight and Judgment appropriate.  ?  ? ?Izora Ribas, NP ?4:11 PM ?[facility] ? ?

## 2021-05-29 ENCOUNTER — Encounter: Payer: Self-pay | Admitting: Adult Health

## 2021-06-08 ENCOUNTER — Encounter: Payer: Self-pay | Admitting: Adult Health

## 2021-06-08 ENCOUNTER — Ambulatory Visit (INDEPENDENT_AMBULATORY_CARE_PROVIDER_SITE_OTHER): Payer: BC Managed Care – PPO | Admitting: Adult Health

## 2021-06-08 VITALS — BP 128/78 | HR 88 | Temp 97.5°F | Wt 217.0 lb

## 2021-06-08 DIAGNOSIS — E785 Hyperlipidemia, unspecified: Secondary | ICD-10-CM | POA: Diagnosis not present

## 2021-06-08 DIAGNOSIS — E669 Obesity, unspecified: Secondary | ICD-10-CM

## 2021-06-08 DIAGNOSIS — E1169 Type 2 diabetes mellitus with other specified complication: Secondary | ICD-10-CM

## 2021-06-08 DIAGNOSIS — Z79899 Other long term (current) drug therapy: Secondary | ICD-10-CM

## 2021-06-08 DIAGNOSIS — Z87891 Personal history of nicotine dependence: Secondary | ICD-10-CM

## 2021-06-08 DIAGNOSIS — E1122 Type 2 diabetes mellitus with diabetic chronic kidney disease: Secondary | ICD-10-CM | POA: Diagnosis not present

## 2021-06-08 DIAGNOSIS — E559 Vitamin D deficiency, unspecified: Secondary | ICD-10-CM

## 2021-06-08 DIAGNOSIS — N182 Chronic kidney disease, stage 2 (mild): Secondary | ICD-10-CM

## 2021-06-08 DIAGNOSIS — E11319 Type 2 diabetes mellitus with unspecified diabetic retinopathy without macular edema: Secondary | ICD-10-CM

## 2021-06-08 DIAGNOSIS — I10 Essential (primary) hypertension: Secondary | ICD-10-CM | POA: Diagnosis not present

## 2021-06-08 DIAGNOSIS — F324 Major depressive disorder, single episode, in partial remission: Secondary | ICD-10-CM

## 2021-06-08 DIAGNOSIS — E1165 Type 2 diabetes mellitus with hyperglycemia: Secondary | ICD-10-CM

## 2021-06-08 DIAGNOSIS — E1142 Type 2 diabetes mellitus with diabetic polyneuropathy: Secondary | ICD-10-CM

## 2021-06-08 NOTE — Progress Notes (Signed)
3 MONTH FOLLOW UP  Assessment and Plan:   Type 2 diabetes mellitus with hyperglycemia, without long-term current use of insulin (HCC) Currently prescribed metformin 2000 mg daily - try to work back up to this ozempic with financial assistance pending; 1 mg/week - next refill try 2 mg/week - samples given  Glipizide 5 mg with each meal - d/c if able -cautioned hypoglycemia risks Education: Reviewed 'ABCs' of diabetes management (respective goals in parentheses):  A1C (<7), blood pressure (<130/80), and cholesterol (LDL <70) Eye Exam yearly and Dental Exam every 6 months- requested to forward diabetic eye exam report - reminded to schedule  Dietary recommendations Physical Activity recommendations -     COMPLETE METABOLIC PANEL WITH GFR -     Hemoglobin A1c  Hyperlipidemia associated with type 2 diabetes mellitus (HCC) Rosuvastatin 40 mg and zetia 10 mg daily LDL goal <70 Continue low cholesterol diet and exercise.  Check lipid panel.  -     Lipid panel -     TSH  CKD II associated with T2DM (HCC) Emphasized better glucose control, continue ARB  Diabetic peripheral neuropathy (HCC) Glucose control, check feet daily, OV if any wounds  Diabetic retinopathy ([place]) Needs better glucose control, reminded to schedule diabetic eye exam  Obesity (BMI 30.0-34.9) Long discussion about weight loss, diet, and exercise Recommended diet heavy in fruits and veggies and low in animal meats, cheeses, and dairy products, appropriate calorie intake Discussed appropriate weight for height and initial goal (200 lb) Follow up at next visit [place] -  Major depressive disorder in partial remission, unspecified whether recurrent ([place]) Doing fairly with trazodone; she will request previous psych records Lifestyle discussed: diet/exerise, sleep hygiene, stress management, hydration  Hypertension Continue losartan 25 mg for BP and renal protection Monitor blood pressure at home; call if consistently  over 130/80 Continue DASH diet.   Reminder to go to the ER if any CP, SOB, nausea, dizziness, severe HA, changes vision/speech, left arm numbness and tingling and jaw pain.  Medication management -     CBC with Differential/Platelet -     COMPLETE METABOLIC PANEL WITH GFR -     Magnesium  Former smoker (50 pack year hx, quit 05/14/2021) 50+ year smoking hx; successfully quit with chantix Monitor; review maintenance strategies    Orders Placed This Encounter  Procedures   CBC with Differential/Platelet   COMPLETE METABOLIC PANEL WITH GFR   Magnesium   Lipid panel   TSH   Hemoglobin A1c   Discussed med's effects and SE's. Further disposition pending lab results. Over 30 minutes of exam, counseling, chart review, and complex, high level critical decision making was performed this visit.   Future Appointments  Date Time Provider [place]  02/27/2022  3:00 PM Liane Comber, NP [facility] None    HPI  56 y.o. female  presents for 3 month follow up on T2DM, hld, CKD, obesity, smoking, depression, vit D def.   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 Husband recently progressed to ESRD, on medicare and doing dialysis, pending qualification for transplant list, financially struggling.   She does have hx of recurrent major depression; taking trazodone 50 mg with good results, working with therapist re her husband's situation.   She is an intermittent smoker with 50+ pack year history, recently quit again 05/14/2021 with chantix. Denies current respiratory sx.   Patient was referred to cardiology due to report of chest pain episode with sig coronary risk factors, possible EKG  changes. She underwent coronary CT by Dr. Eldridge Dace 03/28/2021 that showed CAD-RADS 1, minimal, recommended for medication management and risk factor reduction.   Did incidentally show some R sided pulm nodules ~3 mm, recommended for 1 year follow up.   BMI is Body mass  index is 32.99 kg/m., she has been working on diet, exercise is limited. 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 Has had limited benefit with phentermine/topamax/welbutrin. Now on ozempic 1 mg/week, admits to emotional eating.  Wt Readings from Last 3 Encounters:  06/08/21 217 lb (98.4 kg)  04/26/21 210 lb (95.3 kg)  03/15/21 211 lb (95.7 kg)   She has not been checking BPs at home, reports was been normal at dentist, Today their BP is BP: 128/78  She does not workout. She denies chest pain, shortness of breath, dizziness.   She is on cholesterol medication (rosuvastatin 40 mg daily and newly on zetia 10 mg). Her cholesterol is not at goal. The cholesterol last visit was:   Lab Results  Component Value Date   CHOL 183 02/23/2021   HDL 80 02/23/2021   LDLCALC 79 02/23/2021   TRIG 139 02/23/2021   CHOLHDL 2.3 02/23/2021   She has been working on diet and exercise for T2DM (prescribed metformin ER 1000 mg BID - admits only taking 500 mg at night, ozempic 1mg , would like to go up next refill, glipizide 5 mg with meals - typically once at night), she is on bASA, she is on ACE/ARB, she is on statin and denies foot ulcerations, increased appetite, nausea, polydipsia, polyuria, visual disturbances, vomiting, and weight loss.  She does have mild tingling burning in bil toes.  She does have a meter, not checking  Last A1C in the office was:  Lab Results  Component Value Date   HGBA1C 9.6 (H) 02/23/2021   CKD II on losartan 25 mg. Last GFR: Lab Results  Component Value Date   EGFR 85 03/15/2021   Lab Results  Component Value Date   MICRALBCREAT 19 02/23/2021   Patient is on Vitamin D supplement Lab Results  Component Value Date   VD25OH 49 02/23/2021       Current Medications:  Current Outpatient Medications on File Prior to Visit  Medication Sig Dispense Refill   aspirin EC 81 MG tablet Take 81 mg by mouth daily. Swallow whole.      Cholecalciferol (VITAMIN D3) 250 MCG (10000 UT) TABS Take one tablet daily 30 tablet 0   ezetimibe (ZETIA) 10 MG tablet Take 1 tab daily for cholesterol and heart attack risk reduction. 90 tablet 3   glipiZIDE (GLUCOTROL) 5 MG tablet Take  1/2 to 1 tablet  2 to 3 x /day  with Meals  for Diaberes 90 tablet 0   glucose blood (ONETOUCH VERIO) test strip Check fasting sugar daily (prior to breakfast). 100 each 12   losartan (COZAAR) 25 MG tablet Take 1 tab daily for blood pressure and kidney protection. 90 tablet 3   Lysine (GNP L-LYSINE) 600 MG TABS Take one tablet daily     Magnesium 500 MG TABS Take one tablet daily 30 tablet    metFORMIN (GLUCOPHAGE-XR) 500 MG 24 hr tablet TAKE 2 TABLETS(1000 MG) BY MOUTH TWICE DAILY WITH A MEAL (Patient taking differently: Takes one table in the evening) 360 tablet 1   metoprolol tartrate (LOPRESSOR) 100 MG tablet Take one tablet by mouth 2 hours prior to CT Scan 1 tablet 0  rosuvastatin (CRESTOR) 40 MG tablet Takes 1 tablet everyday 90 tablet 3   Semaglutide, 1 MG/DOSE, (OZEMPIC, 1 MG/DOSE,) 4 MG/3ML SOPN Inject 1 mg into the skin once a week. 9 mL 1   tiZANidine (ZANAFLEX) 4 MG capsule TAKE 1 CAPSULE BY MOUTH 3 TIMES DAILY AS NEEDED FOR MUSCLE SPASMS. 270 capsule 1   traZODone (DESYREL) 150 MG tablet Take 1/3-1 tablet daily for mood and sleep. 90 tablet 3   No current facility-administered medications on file prior to visit.   Allergies:  Allergies  Allergen Reactions   Atorvastatin     Myalgias   Buspirone     "felt like I was going to pass out"    Pravastatin    Medical History:  She has Type 2 diabetes mellitus with hyperglycemia, without long-term current use of insulin ([place]); Hypertension; Hyperlipidemia associated with type 2 diabetes mellitus ([place]); Obesity (BMI 30.0-34.9); Major depression in partial remission ([place]); Discoloration and thickening of nails both feet; Chronic venous insufficiency; Vitamin D deficiency; CKD stage 2 due to type  2 diabetes mellitus ([place]); Diabetic peripheral neuropathy associated with type 2 diabetes mellitus ([place]); TMJ (temporomandibular joint disorder); Former smoker (quit 05/14/2021, 50 pack year hx); Diabetic retinopathy ([place]); Recurrent low back pain episodes; Financial difficulty; At risk for medication nonadherence; and Pulmonary nodules on their problem list.   Surgical History:  She has a past surgical history that includes Cholecystectomy (2005) and Cesarean section (2000). Family History:  Herfamily history includes Renal Disease in her son. She was adopted. Social History:  She reports that she quit smoking about 3 weeks ago. Her smoking use included cigarettes. She started smoking about 40 years ago. She has a 50.00 pack-year smoking history. She has never used smokeless tobacco. She reports current alcohol use. She reports that she does not use drugs.   Review of Systems: Review of Systems  Constitutional:  Negative for malaise/fatigue and weight loss.  HENT:  Negative for hearing loss and tinnitus.   Eyes:  Negative for blurred vision and double vision.  Respiratory:  Negative for cough, shortness of breath and wheezing.   Cardiovascular:  Negative for chest pain, palpitations, orthopnea, claudication and leg swelling (chronic, mild, bil).  Gastrointestinal:  Negative for abdominal pain, blood in stool, constipation, diarrhea, heartburn, melena, nausea and vomiting.  Genitourinary: Negative.   Musculoskeletal:  Positive for back pain (lumbar, ortho following) and joint pain (L shoulder). Negative for myalgias.  Skin:  Negative for rash.  Neurological:  Positive for tingling (bil feet). Negative for dizziness, sensory change, weakness and headaches.  Endo/Heme/Allergies:  Negative for polydipsia.  Psychiatric/Behavioral:  Negative for depression, hallucinations, substance abuse and suicidal ideas. The patient is not nervous/anxious and does not have insomnia.   All other systems reviewed  and are negative.  Physical Exam: Estimated body mass index is 32.99 kg/m as calculated from the following:   Height as of 03/15/21: $RemoveBe'5\' 8"'VuWPPLbbt$  (1.727 m).   Weight as of this encounter: 217 lb (98.4 kg). BP 128/78   Pulse 88   Temp (!) 97.5 F (36.4 C)   Wt 217 lb (98.4 kg)   SpO2 99%   BMI 32.99 kg/m  General Appearance: Well nourished, appears older than stated age, in no apparent distress.  Eyes: PERRLA, EOMs, conjunctiva no swelling or erythema Sinuses: No Frontal/maxillary tenderness  ENT/Mouth: Ext aud canals clear, normal light reflex with TMs without erythema, bulging. Good dentition. No erythema, swelling, or exudate on post pharynx. Tonsils not swollen or erythematous. Hearing  normal. Missing extensive teeth.  Neck: Supple, thyroid normal. No bruits  Respiratory: Respiratory effort normal, BS equal bilaterally without rales, rhonchi, wheezing or stridor.  Cardio: RRR without murmurs, rubs or gallops. Brisk peripheral pulses with 1+ bilateral non-pitting edema to ankles.  Abdomen: Soft, nontender, no guarding, rebound, hernias, masses, or organomegaly.  Lymphatics: Non tender without lymphadenopathy.  Musculoskeletal: Full ROM bil lower extremities, 5/5 strength, and normal gait. L shoulder with limited abduction and external rotation with pain.  Skin: Warm, dry without rashes, lesions, ecchymosis.  Neuro: Cranial nerves intact, reflexes equal bilaterally. Normal muscle tone, no cerebellar symptoms.  Psych: Awake and oriented X 3, normal affect, Insight and Judgment appropriate.   Izora Ribas, DNP, AGNP-C 4:52 PM [facility]

## 2021-06-09 ENCOUNTER — Other Ambulatory Visit: Payer: Self-pay | Admitting: Adult Health

## 2021-06-09 DIAGNOSIS — N289 Disorder of kidney and ureter, unspecified: Secondary | ICD-10-CM

## 2021-06-09 DIAGNOSIS — E875 Hyperkalemia: Secondary | ICD-10-CM

## 2021-06-09 LAB — COMPLETE METABOLIC PANEL WITH GFR
AG Ratio: 1.6 (calc) (ref 1.0–2.5)
ALT: 21 U/L (ref 6–29)
AST: 14 U/L (ref 10–35)
Albumin: 4.2 g/dL (ref 3.6–5.1)
Alkaline phosphatase (APISO): 85 U/L (ref 37–153)
BUN/Creatinine Ratio: 31 (calc) — ABNORMAL HIGH (ref 6–22)
BUN: 29 mg/dL — ABNORMAL HIGH (ref 7–25)
CO2: 30 mmol/L (ref 20–32)
Calcium: 10.3 mg/dL (ref 8.6–10.4)
Chloride: 102 mmol/L (ref 98–110)
Creat: 0.94 mg/dL (ref 0.50–1.03)
Globulin: 2.7 g/dL (calc) (ref 1.9–3.7)
Glucose, Bld: 243 mg/dL — ABNORMAL HIGH (ref 65–99)
Potassium: 5.7 mmol/L — ABNORMAL HIGH (ref 3.5–5.3)
Sodium: 140 mmol/L (ref 135–146)
Total Bilirubin: 0.2 mg/dL (ref 0.2–1.2)
Total Protein: 6.9 g/dL (ref 6.1–8.1)
eGFR: 73 mL/min/{1.73_m2} (ref >=60)

## 2021-06-09 LAB — MAGNESIUM: Magnesium: 2.1 mg/dL (ref 1.5–2.5)

## 2021-06-09 LAB — LIPID PANEL
Cholesterol: 287 mg/dL — ABNORMAL HIGH (ref <200)
HDL: 85 mg/dL (ref >=50)
LDL Cholesterol (Calc): 169 mg/dL (calc) — ABNORMAL HIGH
Non-HDL Cholesterol (Calc): 202 mg/dL (calc) — ABNORMAL HIGH (ref <130)
Total CHOL/HDL Ratio: 3.4 (calc) (ref <5.0)
Triglycerides: 173 mg/dL — ABNORMAL HIGH (ref <150)

## 2021-06-09 LAB — HEMOGLOBIN A1C
Hgb A1c MFr Bld: 10.5 % of total Hgb — ABNORMAL HIGH (ref <5.7)
Mean Plasma Glucose: 255 mg/dL
eAG (mmol/L): 14.1 mmol/L

## 2021-06-09 LAB — CBC WITH DIFFERENTIAL/PLATELET
Absolute Monocytes: 624 cells/uL (ref 200–950)
Basophils Absolute: 96 cells/uL (ref 0–200)
Basophils Relative: 1 %
Eosinophils Absolute: 394 cells/uL (ref 15–500)
Eosinophils Relative: 4.1 %
HCT: 38.7 % (ref 35.0–45.0)
Hemoglobin: 12.7 g/dL (ref 11.7–15.5)
Lymphs Abs: 3302 cells/uL (ref 850–3900)
MCH: 28.8 pg (ref 27.0–33.0)
MCHC: 32.8 g/dL (ref 32.0–36.0)
MCV: 87.8 fL (ref 80.0–100.0)
MPV: 10.4 fL (ref 7.5–12.5)
Monocytes Relative: 6.5 %
Neutro Abs: 5184 cells/uL (ref 1500–7800)
Neutrophils Relative %: 54 %
Platelets: 334 10*3/uL (ref 140–400)
RBC: 4.41 10*6/uL (ref 3.80–5.10)
RDW: 12 % (ref 11.0–15.0)
Total Lymphocyte: 34.4 %
WBC: 9.6 10*3/uL (ref 3.8–10.8)

## 2021-06-09 LAB — TSH: TSH: 2.48 mIU/L

## 2021-06-13 ENCOUNTER — Other Ambulatory Visit: Payer: BC Managed Care – PPO

## 2021-06-13 DIAGNOSIS — E875 Hyperkalemia: Secondary | ICD-10-CM

## 2021-06-13 DIAGNOSIS — N289 Disorder of kidney and ureter, unspecified: Secondary | ICD-10-CM

## 2021-06-14 LAB — BASIC METABOLIC PANEL WITH GFR
BUN/Creatinine Ratio: 28 (calc) — ABNORMAL HIGH (ref 6–22)
BUN: 26 mg/dL — ABNORMAL HIGH (ref 7–25)
CO2: 26 mmol/L (ref 20–32)
Calcium: 9.8 mg/dL (ref 8.6–10.4)
Chloride: 101 mmol/L (ref 98–110)
Creat: 0.94 mg/dL (ref 0.50–1.03)
Glucose, Bld: 98 mg/dL (ref 65–99)
Potassium: 5.1 mmol/L (ref 3.5–5.3)
Sodium: 136 mmol/L (ref 135–146)
eGFR: 73 mL/min/{1.73_m2} (ref >=60)

## 2021-06-21 ENCOUNTER — Encounter: Payer: Self-pay | Admitting: Adult Health

## 2021-07-01 ENCOUNTER — Encounter: Payer: Self-pay | Admitting: Adult Health

## 2021-07-14 ENCOUNTER — Encounter: Payer: Self-pay | Admitting: Adult Health

## 2021-08-14 ENCOUNTER — Other Ambulatory Visit: Payer: Self-pay | Admitting: Nurse Practitioner

## 2021-08-14 DIAGNOSIS — E1165 Type 2 diabetes mellitus with hyperglycemia: Secondary | ICD-10-CM

## 2021-09-12 NOTE — Progress Notes (Deleted)
3 MONTH FOLLOW UP  Assessment and Plan:   Type 2 diabetes mellitus with hyperglycemia, without long-term current use of insulin (HCC) Currently prescribed metformin 2000 mg daily  Semaglutide 1 mg SQ QW Glipizide 5 mg with each meal - d/c if able -cautioned hypoglycemia risks Education: Reviewed 'ABCs' of diabetes management (respective goals in parentheses):  A1C (<7), blood pressure (<130/80), and cholesterol (LDL <70) Eye Exam yearly and Dental Exam every 6 months- requested to forward diabetic eye exam report - reminded to schedule  Dietary recommendations Physical Activity recommendations -     COMPLETE METABOLIC PANEL WITH GFR -     Hemoglobin A1c  Hyperlipidemia associated with type 2 diabetes mellitus (HCC) Rosuvastatin 40 mg and zetia 10 mg daily LDL goal <70 Continue low cholesterol diet and exercise.  Check lipid panel.  -     Lipid panel -     TSH  CKD II associated with T2DM (HCC) Emphasized better glucose control, continue ARB  Diabetic peripheral neuropathy (HCC) Glucose control, check feet daily, OV if any wounds  Diabetic retinopathy ([place]) Needs better glucose control, reminded to schedule diabetic eye exam  Obesity (BMI 30.0-34.9) Long discussion about weight loss, diet, and exercise Recommended diet heavy in fruits and veggies and low in animal meats, cheeses, and dairy products, appropriate calorie intake Discussed appropriate weight for height and initial goal (200 lb) Follow up at next visit [place] -  Major depressive disorder in partial remission, unspecified whether recurrent ([place]) Doing fairly with trazodone; she will request previous psych records Lifestyle discussed: diet/exerise, sleep hygiene, stress management, hydration  Hypertension Continue losartan 25 mg for BP and renal protection Monitor blood pressure at home; call if consistently over 130/80 Continue DASH diet.   Reminder to go to the ER if any CP, SOB, nausea, dizziness, severe  HA, changes vision/speech, left arm numbness and tingling and jaw pain.  Medication management -     CBC with Differential/Platelet -     COMPLETE METABOLIC PANEL WITH GFR -     Magnesium  Former smoker (50 pack year hx, quit 05/14/2021) 50+ year smoking hx; successfully quit with chantix Monitor; review maintenance strategies    No orders of the defined types were placed in this encounter.  Discussed med's effects and SE's. Further disposition pending lab results. Over 30 minutes of exam, counseling, chart review, and complex, high level critical decision making was performed this visit.   Future Appointments  Date Time Provider [place]  09/13/2021  4:00 PM Alycia Rossetti, NP [facility] None  02/27/2022  3:00 PM Darrol Jump, NP [facility] None    HPI  56 y.o. female  presents for 3 month follow up on T2DM, hld, CKD, obesity, smoking, depression, vit D def.    She does have hx of recurrent major depression; taking trazodone 50 mg with good results, working with therapist re her husband's situation.   She is an intermittent smoker with 50+ pack year history, recently quit again 05/14/2021 with chantix. Denies current respiratory sx.   Patient was referred to cardiology due to report of chest pain episode with sig coronary risk factors, possible EKG changes. She underwent coronary CT by Dr. Irish Lack 03/28/2021 that showed CAD-RADS 1, minimal, recommended for medication management and risk factor reduction.   Did incidentally show some R sided pulm nodules ~3 mm, recommended for 1 year follow up.   BMI is There is no height or weight on file to calculate BMI., she has been working on  diet, exercise is limited. 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 Has had limited benefit with phentermine/topamax/welbutrin. Now on ozempic 1 mg/week, admits to emotional eating.  Wt Readings from Last 3 Encounters:  06/08/21 217 lb  (98.4 kg)  04/26/21 210 lb (95.3 kg)  03/15/21 211 lb (95.7 kg)    She has not been checking BPs at home, reports was been normal at dentist, Today their BP is    BP Readings from Last 3 Encounters:  06/08/21 128/78  04/26/21 (!) 142/76  03/28/21 139/68  She does not workout. She denies chest pain, shortness of breath, dizziness.    She is on cholesterol medication (rosuvastatin 40 mg daily and newly on zetia 10 mg). Her cholesterol is not at goal. The cholesterol last visit was:   Lab Results  Component Value Date   CHOL 287 (H) 06/08/2021   HDL 85 06/08/2021   LDLCALC 169 (H) 06/08/2021   TRIG 173 (H) 06/08/2021   CHOLHDL 3.4 06/08/2021   She has been working on diet and exercise for T2DM (prescribed metformin ER 1000 mg BID - admits only taking 500 mg at night, ozempic 64m, would like to go up next refill, glipizide 5 mg with meals - typically once at night), she is on bASA, she is on ACE/ARB, she is on statin and denies foot ulcerations, increased appetite, nausea, polydipsia, polyuria, visual disturbances, vomiting, and weight loss.  She does have mild tingling burning in bil toes.  She does have a meter, not checking  Last A1C in the office was:  Lab Results  Component Value Date   HGBA1C 10.5 (H) 06/08/2021   CKD II on losartan 25 mg. Last GFR: Lab Results  Component Value Date   EGFR 73 06/13/2021   Lab Results  Component Value Date   MICRALBCREAT 19 02/23/2021   Patient is on Vitamin D supplement Lab Results  Component Value Date   VD25OH 49 02/23/2021       Current Medications:  Current Outpatient Medications on File Prior to Visit  Medication Sig Dispense Refill   aspirin EC 81 MG tablet Take 81 mg by mouth daily. Swallow whole.     Cholecalciferol (VITAMIN D3) 250 MCG (10000 UT) TABS Take one tablet daily 30 tablet 0   ezetimibe (ZETIA) 10 MG tablet Take 1 tab daily for cholesterol and heart attack risk reduction. 90 tablet 3   glipiZIDE (GLUCOTROL) 5  MG tablet TAKE 1/2 TO 1 (ONE-HALF TO ONE) TABLET BY MOUTH 2 TO 3 TIMES DAILY WITH MEALS FOR  DIABETES 90 tablet 0   glucose blood (ONETOUCH VERIO) test strip Check fasting sugar daily (prior to breakfast). 100 each 12   losartan (COZAAR) 25 MG tablet Take 1 tab daily for blood pressure and kidney protection. 90 tablet 3   Lysine (GNP L-LYSINE) 600 MG TABS Take one tablet daily     Magnesium 500 MG TABS Take one tablet daily 30 tablet    metFORMIN (GLUCOPHAGE-XR) 500 MG 24 hr tablet TAKE 2 TABLETS(1000 MG) BY MOUTH TWICE DAILY WITH A MEAL (Patient taking differently: Takes one table in the evening) 360 tablet 1   metoprolol tartrate (LOPRESSOR) 100 MG tablet Take one tablet by mouth 2 hours prior to CT Scan 1 tablet 0   rosuvastatin (CRESTOR) 40 MG tablet Takes 1 tablet everyday 90 tablet 3   Semaglutide, 1 MG/DOSE, (OZEMPIC, 1 MG/DOSE,) 4 MG/3ML SOPN Inject 1 mg into the skin once a  week. 9 mL 1   tiZANidine (ZANAFLEX) 4 MG capsule TAKE 1 CAPSULE BY MOUTH 3 TIMES DAILY AS NEEDED FOR MUSCLE SPASMS. 270 capsule 1   traZODone (DESYREL) 150 MG tablet Take 1/3-1 tablet daily for mood and sleep. 90 tablet 3   No current facility-administered medications on file prior to visit.   Allergies:  Allergies  Allergen Reactions   Atorvastatin     Myalgias   Buspirone     "felt like I was going to pass out"    Pravastatin    Medical History:  She has Type 2 diabetes mellitus with hyperglycemia, without long-term current use of insulin ([place]); Hypertension; Hyperlipidemia associated with type 2 diabetes mellitus ([place]); Obesity (BMI 30.0-34.9); Major depression in partial remission ([place]); Discoloration and thickening of nails both feet; Chronic venous insufficiency; Vitamin D deficiency; CKD stage 2 due to type 2 diabetes mellitus ([place]); Diabetic peripheral neuropathy associated with type 2 diabetes mellitus ([place]); TMJ (temporomandibular joint disorder); Former smoker (quit 05/14/2021, 50 pack year hx);  Diabetic retinopathy ([place]); Recurrent low back pain episodes; Financial difficulty; At risk for medication nonadherence; and Pulmonary nodules on their problem list.   Surgical History:  She has a past surgical history that includes Cholecystectomy (2005) and Cesarean section (2000). Family History:  Herfamily history includes Renal Disease in her son. She was adopted. Social History:  She reports that she quit smoking about 3 months ago. Her smoking use included cigarettes. She started smoking about 40 years ago. She has a 50.00 pack-year smoking history. She has never used smokeless tobacco. She reports current alcohol use. She reports that she does not use drugs.   Review of Systems: Review of Systems  Constitutional:  Negative for malaise/fatigue and weight loss.  HENT:  Negative for hearing loss and tinnitus.   Eyes:  Negative for blurred vision and double vision.  Respiratory:  Negative for cough, shortness of breath and wheezing.   Cardiovascular:  Negative for chest pain, palpitations, orthopnea, claudication and leg swelling (chronic, mild, bil).  Gastrointestinal:  Negative for abdominal pain, blood in stool, constipation, diarrhea, heartburn, melena, nausea and vomiting.  Genitourinary: Negative.   Musculoskeletal:  Positive for back pain (lumbar, ortho following) and joint pain (L shoulder). Negative for myalgias.  Skin:  Negative for rash.  Neurological:  Positive for tingling (bil feet). Negative for dizziness, sensory change, weakness and headaches.  Endo/Heme/Allergies:  Negative for polydipsia.  Psychiatric/Behavioral:  Negative for depression, hallucinations, substance abuse and suicidal ideas. The patient is not nervous/anxious and does not have insomnia.   All other systems reviewed and are negative.   Physical Exam: Estimated body mass index is 32.99 kg/m as calculated from the following:   Height as of 03/15/21: 5' 8" (1.727 m).   Weight as of 06/08/21: 217 lb (98.4  kg). There were no vitals taken for this visit. General Appearance: Well nourished, appears older than stated age, in no apparent distress.  Eyes: PERRLA, EOMs, conjunctiva no swelling or erythema Sinuses: No Frontal/maxillary tenderness  ENT/Mouth: Ext aud canals clear, normal light reflex with TMs without erythema, bulging. Good dentition. No erythema, swelling, or exudate on post pharynx. Tonsils not swollen or erythematous. Hearing normal. Missing extensive teeth.  Neck: Supple, thyroid normal. No bruits  Respiratory: Respiratory effort normal, BS equal bilaterally without rales, rhonchi, wheezing or stridor.  Cardio: RRR without murmurs, rubs or gallops. Brisk peripheral pulses with 1+ bilateral non-pitting edema to ankles.  Abdomen: Soft, nontender, no guarding, rebound, hernias, masses, or organomegaly.  Lymphatics: Non tender without lymphadenopathy.  Musculoskeletal: Full ROM bil lower extremities, 5/5 strength, and normal gait. L shoulder with limited abduction and external rotation with pain.  Skin: Warm, dry without rashes, lesions, ecchymosis.  Neuro: Cranial nerves intact, reflexes equal bilaterally. Normal muscle tone, no cerebellar symptoms.  Psych: Awake and oriented X 3, normal affect, Insight and Judgment appropriate.   Alycia Rossetti, DNP, AGNP-C 12:18 PM [facility]

## 2021-09-13 ENCOUNTER — Ambulatory Visit: Payer: BC Managed Care – PPO | Admitting: Nurse Practitioner

## 2021-09-13 DIAGNOSIS — E669 Obesity, unspecified: Secondary | ICD-10-CM

## 2021-09-13 DIAGNOSIS — Z79899 Other long term (current) drug therapy: Secondary | ICD-10-CM

## 2021-09-13 DIAGNOSIS — E11319 Type 2 diabetes mellitus with unspecified diabetic retinopathy without macular edema: Secondary | ICD-10-CM

## 2021-09-13 DIAGNOSIS — E1169 Type 2 diabetes mellitus with other specified complication: Secondary | ICD-10-CM

## 2021-09-13 DIAGNOSIS — Z87891 Personal history of nicotine dependence: Secondary | ICD-10-CM

## 2021-09-13 DIAGNOSIS — F324 Major depressive disorder, single episode, in partial remission: Secondary | ICD-10-CM

## 2021-09-13 DIAGNOSIS — I10 Essential (primary) hypertension: Secondary | ICD-10-CM

## 2021-09-13 DIAGNOSIS — E1142 Type 2 diabetes mellitus with diabetic polyneuropathy: Secondary | ICD-10-CM

## 2021-09-13 DIAGNOSIS — E1122 Type 2 diabetes mellitus with diabetic chronic kidney disease: Secondary | ICD-10-CM

## 2021-09-13 DIAGNOSIS — E1165 Type 2 diabetes mellitus with hyperglycemia: Secondary | ICD-10-CM

## 2021-10-04 NOTE — Progress Notes (Unsigned)
3 MONTH FOLLOW UP  Assessment and Plan:   Type 2 diabetes mellitus with hyperglycemia, without long-term current use of insulin (HCC) Currently prescribed metformin 2000 mg daily  Semaglutide 1 mg SQ QW Glipizide 5 mg with each meal - d/c if able -cautioned hypoglycemia risks Education: Reviewed 'ABCs' of diabetes management (respective goals in parentheses):  A1C (<7), blood pressure (<130/80), and cholesterol (LDL <70) Eye Exam yearly and Dental Exam every 6 months- requested to forward diabetic eye exam report - reminded to schedule  Dietary recommendations Physical Activity recommendations -     COMPLETE METABOLIC PANEL WITH GFR -     Hemoglobin A1c  Hyperlipidemia associated with type 2 diabetes mellitus (HCC) Rosuvastatin 40 mg and zetia 10 mg daily LDL goal <70 Continue low cholesterol diet and exercise.  Check lipid panel.  -     Lipid panel -     TSH  CKD II associated with T2DM (HCC) Emphasized better glucose control, continue ARB  Diabetic peripheral neuropathy (HCC) Glucose control, check feet daily, OV if any wounds  Diabetic retinopathy ([place]) Needs better glucose control, reminded to schedule diabetic eye exam  Obesity (BMI 30.0-34.9) Long discussion about weight loss, diet, and exercise Recommended diet heavy in fruits and veggies and low in animal meats, cheeses, and dairy products, appropriate calorie intake Discussed appropriate weight for height and initial goal (200 lb) Follow up at next visit [place] -  Major depressive disorder in partial remission, unspecified whether recurrent ([place]) Doing fairly with trazodone; she will request previous psych records Lifestyle discussed: diet/exerise, sleep hygiene, stress management, hydration  Hypertension Continue losartan 25 mg for BP and renal protection Monitor blood pressure at home; call if consistently over 130/80 Continue DASH diet.   Reminder to go to the ER if any CP, SOB, nausea, dizziness, severe  HA, changes vision/speech, left arm numbness and tingling and jaw pain.  Medication management -     CBC with Differential/Platelet -     COMPLETE METABOLIC PANEL WITH GFR -     Magnesium    Discussed med's effects and SE's. Further disposition pending lab results. Over 30 minutes of exam, counseling, chart review, and complex, high level critical decision making was performed this visit.   Future Appointments  Date Time Provider [place]  10/05/2021  4:00 PM Alycia Rossetti, NP [facility] None  02/27/2022  3:00 PM Darrol Jump, NP [facility] None    HPI  56 y.o. female  presents for 3 month follow up on T2DM, hld, CKD, obesity, smoking, depression, vit D def.    She does have hx of recurrent major depression; taking trazodone 50 mg with good results, working with therapist re her husband's situation.   She is an intermittent smoker with 50+ pack year history, recently quit again 05/14/2021 with chantix. Denies current respiratory sx.   Patient was referred to cardiology due to report of chest pain episode with sig coronary risk factors, possible EKG changes. She underwent coronary CT by Dr. Irish Lack 03/28/2021 that showed CAD-RADS 1, minimal, recommended for medication management and risk factor reduction.   Did incidentally show some R sided pulm nodules ~3 mm, recommended for 1 year follow up.   BMI is There is no height or weight on file to calculate BMI., she has been working on diet, exercise is limited. 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 Has had limited benefit with phentermine/topamax/welbutrin. Now on ozempic 1 mg/week, admits to emotional eating.  Wt Readings from Last 3 Encounters:  06/08/21 217 lb (98.4 kg)  04/26/21 210 lb (95.3 kg)  03/15/21 211 lb (95.7 kg)    She has not been checking BPs at home, reports was been normal at dentist, Today their BP is    BP Readings from Last 3 Encounters:   06/08/21 128/78  04/26/21 (!) 142/76  03/28/21 139/68  She does not workout. She denies chest pain, shortness of breath, dizziness.    She is on cholesterol medication (rosuvastatin 40 mg daily and newly on zetia 10 mg). Her cholesterol is not at goal. The cholesterol last visit was:   Lab Results  Component Value Date   CHOL 287 (H) 06/08/2021   HDL 85 06/08/2021   LDLCALC 169 (H) 06/08/2021   TRIG 173 (H) 06/08/2021   CHOLHDL 3.4 06/08/2021   She has been working on diet and exercise for T2DM (prescribed metformin ER 1000 mg BID - admits only taking 500 mg at night, ozempic $RemoveBefore'1mg'[id number]$ , would like to go up next refill, glipizide 5 mg with meals - typically once at night), she is on bASA, she is on ACE/ARB, she is on statin and denies foot ulcerations, increased appetite, nausea, polydipsia, polyuria, visual disturbances, vomiting, and weight loss.  She does have mild tingling burning in bil toes.  She does have a meter, not checking  Last A1C in the office was:  Lab Results  Component Value Date   HGBA1C 10.5 (H) 06/08/2021   CKD II on losartan 25 mg. Last GFR: Lab Results  Component Value Date   EGFR 73 06/13/2021   Lab Results  Component Value Date   MICRALBCREAT 19 02/23/2021   Patient is on Vitamin D supplement Lab Results  Component Value Date   VD25OH 49 02/23/2021       Current Medications:  Current Outpatient Medications on File Prior to Visit  Medication Sig Dispense Refill   aspirin EC 81 MG tablet Take 81 mg by mouth daily. Swallow whole.     Cholecalciferol (VITAMIN D3) 250 MCG (10000 UT) TABS Take one tablet daily 30 tablet 0   ezetimibe (ZETIA) 10 MG tablet Take 1 tab daily for cholesterol and heart attack risk reduction. 90 tablet 3   glipiZIDE (GLUCOTROL) 5 MG tablet TAKE 1/2 TO 1 (ONE-HALF TO ONE) TABLET BY MOUTH 2 TO 3 TIMES DAILY WITH MEALS FOR  DIABETES 90 tablet 0   glucose blood (ONETOUCH VERIO) test strip Check fasting sugar daily (prior to breakfast).  100 each 12   losartan (COZAAR) 25 MG tablet Take 1 tab daily for blood pressure and kidney protection. 90 tablet 3   Lysine (GNP L-LYSINE) 600 MG TABS Take one tablet daily     Magnesium 500 MG TABS Take one tablet daily 30 tablet    metFORMIN (GLUCOPHAGE-XR) 500 MG 24 hr tablet TAKE 2 TABLETS(1000 MG) BY MOUTH TWICE DAILY WITH A MEAL (Patient taking differently: Takes one table in the evening) 360 tablet 1   metoprolol tartrate (LOPRESSOR) 100 MG tablet Take one tablet by mouth 2 hours prior to CT Scan 1 tablet 0   rosuvastatin (CRESTOR) 40 MG tablet Takes 1 tablet everyday 90 tablet 3   Semaglutide, 1 MG/DOSE, (OZEMPIC, 1 MG/DOSE,) 4 MG/3ML SOPN Inject 1 mg into the skin once a week. 9 mL 1   tiZANidine (ZANAFLEX) 4 MG capsule TAKE 1 CAPSULE BY MOUTH 3 TIMES DAILY AS NEEDED FOR MUSCLE SPASMS. 270 capsule 1   traZODone (DESYREL) 150 MG tablet  Take 1/3-1 tablet daily for mood and sleep. 90 tablet 3   No current facility-administered medications on file prior to visit.   Allergies:  Allergies  Allergen Reactions   Atorvastatin     Myalgias   Buspirone     "felt like I was going to pass out"    Pravastatin    Medical History:  She has Type 2 diabetes mellitus with hyperglycemia, without long-term current use of insulin ([place]); Hypertension; Hyperlipidemia associated with type 2 diabetes mellitus ([place]); Obesity (BMI 30.0-34.9); Major depression in partial remission ([place]); Discoloration and thickening of nails both feet; Chronic venous insufficiency; Vitamin D deficiency; CKD stage 2 due to type 2 diabetes mellitus ([place]); Diabetic peripheral neuropathy associated with type 2 diabetes mellitus ([place]); TMJ (temporomandibular joint disorder); Former smoker (quit 05/14/2021, 50 pack year hx); Diabetic retinopathy ([place]); Recurrent low back pain episodes; Financial difficulty; At risk for medication nonadherence; and Pulmonary nodules on their problem list.   Surgical History:  She has a past surgical  history that includes Cholecystectomy (2005) and Cesarean section (2000). Family History:  Herfamily history includes Renal Disease in her son. She was adopted. Social History:  She reports that she quit smoking about 4 months ago. Her smoking use included cigarettes. She started smoking about 40 years ago. She has a 50.00 pack-year smoking history. She has never used smokeless tobacco. She reports current alcohol use. She reports that she does not use drugs.   Review of Systems: Review of Systems  Constitutional:  Negative for malaise/fatigue and weight loss.  HENT:  Negative for hearing loss and tinnitus.   Eyes:  Negative for blurred vision and double vision.  Respiratory:  Negative for cough, shortness of breath and wheezing.   Cardiovascular:  Negative for chest pain, palpitations, orthopnea, claudication and leg swelling (chronic, mild, bil).  Gastrointestinal:  Negative for abdominal pain, blood in stool, constipation, diarrhea, heartburn, melena, nausea and vomiting.  Genitourinary: Negative.   Musculoskeletal:  Positive for back pain (lumbar, ortho following) and joint pain (L shoulder). Negative for myalgias.  Skin:  Negative for rash.  Neurological:  Positive for tingling (bil feet). Negative for dizziness, sensory change, weakness and headaches.  Endo/Heme/Allergies:  Negative for polydipsia.  Psychiatric/Behavioral:  Negative for depression, hallucinations, substance abuse and suicidal ideas. The patient is not nervous/anxious and does not have insomnia.   All other systems reviewed and are negative.   Physical Exam: Estimated body mass index is 32.99 kg/m as calculated from the following:   Height as of 03/15/21: $RemoveBe'5\' 8"'cqtOQXEdo$  (1.727 m).   Weight as of 06/08/21: 217 lb (98.4 kg). There were no vitals taken for this visit. General Appearance: Well nourished, appears older than stated age, in no apparent distress.  Eyes: PERRLA, EOMs, conjunctiva no swelling or erythema Sinuses: No  Frontal/maxillary tenderness  ENT/Mouth: Ext aud canals clear, normal light reflex with TMs without erythema, bulging. Good dentition. No erythema, swelling, or exudate on post pharynx. Tonsils not swollen or erythematous. Hearing normal. Missing extensive teeth.  Neck: Supple, thyroid normal. No bruits  Respiratory: Respiratory effort normal, BS equal bilaterally without rales, rhonchi, wheezing or stridor.  Cardio: RRR without murmurs, rubs or gallops. Brisk peripheral pulses with 1+ bilateral non-pitting edema to ankles.  Abdomen: Soft, nontender, no guarding, rebound, hernias, masses, or organomegaly.  Lymphatics: Non tender without lymphadenopathy.  Musculoskeletal: Full ROM bil lower extremities, 5/5 strength, and normal gait. L shoulder with limited abduction and external rotation with pain.  Skin: Warm, dry without rashes,  lesions, ecchymosis.  Neuro: Cranial nerves intact, reflexes equal bilaterally. Normal muscle tone, no cerebellar symptoms.  Psych: Awake and oriented X 3, normal affect, Insight and Judgment appropriate.   Alycia Rossetti, DNP, AGNP-C 12:37 PM [facility]

## 2021-10-05 ENCOUNTER — Encounter: Payer: Self-pay | Admitting: Nurse Practitioner

## 2021-10-05 ENCOUNTER — Ambulatory Visit (INDEPENDENT_AMBULATORY_CARE_PROVIDER_SITE_OTHER): Payer: BC Managed Care – PPO | Admitting: Nurse Practitioner

## 2021-10-05 VITALS — BP 130/60 | HR 84 | Temp 97.5°F | Ht 68.0 in | Wt 221.6 lb

## 2021-10-05 DIAGNOSIS — I10 Essential (primary) hypertension: Secondary | ICD-10-CM

## 2021-10-05 DIAGNOSIS — Z79899 Other long term (current) drug therapy: Secondary | ICD-10-CM

## 2021-10-05 DIAGNOSIS — N182 Chronic kidney disease, stage 2 (mild): Secondary | ICD-10-CM

## 2021-10-05 DIAGNOSIS — E1165 Type 2 diabetes mellitus with hyperglycemia: Secondary | ICD-10-CM | POA: Diagnosis not present

## 2021-10-05 DIAGNOSIS — E1122 Type 2 diabetes mellitus with diabetic chronic kidney disease: Secondary | ICD-10-CM | POA: Diagnosis not present

## 2021-10-05 DIAGNOSIS — F324 Major depressive disorder, single episode, in partial remission: Secondary | ICD-10-CM

## 2021-10-05 DIAGNOSIS — E11319 Type 2 diabetes mellitus with unspecified diabetic retinopathy without macular edema: Secondary | ICD-10-CM | POA: Diagnosis not present

## 2021-10-05 DIAGNOSIS — E785 Hyperlipidemia, unspecified: Secondary | ICD-10-CM

## 2021-10-05 DIAGNOSIS — E1169 Type 2 diabetes mellitus with other specified complication: Secondary | ICD-10-CM

## 2021-10-05 DIAGNOSIS — E1142 Type 2 diabetes mellitus with diabetic polyneuropathy: Secondary | ICD-10-CM

## 2021-10-05 DIAGNOSIS — E669 Obesity, unspecified: Secondary | ICD-10-CM

## 2021-10-05 MED ORDER — SEMAGLUTIDE(0.25 OR 0.5MG/DOS) 2 MG/3ML ~~LOC~~ SOPN: 0.5000 mg | PEN_INJECTOR | SUBCUTANEOUS | 0 refills | Status: DC

## 2021-10-06 ENCOUNTER — Encounter: Payer: Self-pay | Admitting: Nurse Practitioner

## 2021-10-06 LAB — COMPLETE METABOLIC PANEL WITH GFR
AG Ratio: 1.5 (calc) (ref 1.0–2.5)
ALT: 20 U/L (ref 6–29)
AST: 18 U/L (ref 10–35)
Albumin: 4 g/dL (ref 3.6–5.1)
Alkaline phosphatase (APISO): 70 U/L (ref 37–153)
BUN/Creatinine Ratio: 26 (calc) — ABNORMAL HIGH (ref 6–22)
BUN: 29 mg/dL — ABNORMAL HIGH (ref 7–25)
CO2: 28 mmol/L (ref 20–32)
Calcium: 9.8 mg/dL (ref 8.6–10.4)
Chloride: 102 mmol/L (ref 98–110)
Creat: 1.11 mg/dL — ABNORMAL HIGH (ref 0.50–1.03)
Globulin: 2.7 g/dL (calc) (ref 1.9–3.7)
Glucose, Bld: 284 mg/dL — ABNORMAL HIGH (ref 65–99)
Potassium: 5.4 mmol/L — ABNORMAL HIGH (ref 3.5–5.3)
Sodium: 137 mmol/L (ref 135–146)
Total Bilirubin: 0.2 mg/dL (ref 0.2–1.2)
Total Protein: 6.7 g/dL (ref 6.1–8.1)
eGFR: 59 mL/min/{1.73_m2} — ABNORMAL LOW (ref >=60)

## 2021-10-06 LAB — CBC WITH DIFFERENTIAL/PLATELET
Absolute Monocytes: 710 cells/uL (ref 200–950)
Basophils Absolute: 111 cells/uL (ref 0–200)
Basophils Relative: 1 %
Eosinophils Absolute: 533 cells/uL — ABNORMAL HIGH (ref 15–500)
Eosinophils Relative: 4.8 %
HCT: 37.6 % (ref 35.0–45.0)
Hemoglobin: 12.6 g/dL (ref 11.7–15.5)
Lymphs Abs: 3319 cells/uL (ref 850–3900)
MCH: 29.6 pg (ref 27.0–33.0)
MCHC: 33.5 g/dL (ref 32.0–36.0)
MCV: 88.3 fL (ref 80.0–100.0)
MPV: 10.2 fL (ref 7.5–12.5)
Monocytes Relative: 6.4 %
Neutro Abs: 6427 cells/uL (ref 1500–7800)
Neutrophils Relative %: 57.9 %
Platelets: 296 10*3/uL (ref 140–400)
RBC: 4.26 10*6/uL (ref 3.80–5.10)
RDW: 11.8 % (ref 11.0–15.0)
Total Lymphocyte: 29.9 %
WBC: 11.1 10*3/uL — ABNORMAL HIGH (ref 3.8–10.8)

## 2021-10-06 LAB — LIPID PANEL
Cholesterol: 128 mg/dL (ref <200)
HDL: 63 mg/dL (ref >=50)
LDL Cholesterol (Calc): 44 mg/dL (calc)
Non-HDL Cholesterol (Calc): 65 mg/dL (calc) (ref <130)
Total CHOL/HDL Ratio: 2 (calc) (ref <5.0)
Triglycerides: 129 mg/dL (ref <150)

## 2021-10-06 LAB — HEMOGLOBIN A1C
Hgb A1c MFr Bld: 13.3 % of total Hgb — ABNORMAL HIGH (ref <5.7)
Mean Plasma Glucose: 335 mg/dL
eAG (mmol/L): 18.6 mmol/L

## 2021-10-08 ENCOUNTER — Other Ambulatory Visit: Payer: Self-pay | Admitting: Nurse Practitioner

## 2021-10-08 DIAGNOSIS — E1165 Type 2 diabetes mellitus with hyperglycemia: Secondary | ICD-10-CM

## 2021-10-08 DIAGNOSIS — B3731 Acute candidiasis of vulva and vagina: Secondary | ICD-10-CM

## 2021-10-08 MED ORDER — FREESTYLE LIBRE 3 SENSOR MISC: 3 refills | Status: DC

## 2021-10-08 MED ORDER — MONISTAT 7 COMBO PACK APP 100 & 2 MG-% (9GM) VA KIT: PACK | VAGINAL | 1 refills | Status: DC

## 2021-10-26 ENCOUNTER — Encounter: Payer: Self-pay | Admitting: Nurse Practitioner

## 2021-12-13 NOTE — Progress Notes (Unsigned)
Assessment and Plan:  There are no diagnoses linked to this encounter.    Further disposition pending results of labs. Discussed med's effects and SE's.   Over 30 minutes of exam, counseling, chart review, and critical decision making was performed.   Future Appointments  Date Time Provider [place]  12/14/2021  9:00 AM Alycia Rossetti, NP [facility] None  01/11/2022  4:00 PM Alycia Rossetti, NP [facility] None  02/27/2022  3:00 PM Darrol Jump, NP [facility] None    ------------------------------------------------------------------------------------------------------------------   HPI There were no vitals taken for this visit. 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 Swallow whole.   Cholecalciferol (VITAMIN D3) 250 MCG (10000 UT) TABS Take one tablet daily   Continuous Blood Gluc Sensor (FREESTYLE LIBRE 3 SENSOR) MISC Place 1 sensor on the skin every 14 days. Use to check glucose continuously   ezetimibe (ZETIA) 10 MG tablet Take 1 tab daily for cholesterol and heart attack risk reduction.   glipiZIDE (GLUCOTROL) 5 MG tablet TAKE 1/2 TO 1 (ONE-HALF TO ONE) TABLET BY MOUTH 2 TO 3 TIMES DAILY WITH MEALS FOR  DIABETES   glucose blood (ONETOUCH VERIO) test strip Check fasting sugar daily (prior to breakfast).   losartan (COZAAR) 25 MG tablet Take 1 tab daily for blood pressure and kidney protection.   Lysine (GNP L-LYSINE) 600 MG TABS Take one tablet daily   Magnesium 500 MG TABS Take one tablet daily   metFORMIN (GLUCOPHAGE-XR) 500 MG 24 hr tablet TAKE 2 TABLETS(1000 MG) BY MOUTH TWICE DAILY WITH A MEAL (Patient taking  differently: Takes one table in the evening)   Miconazole Nitrate Applicator (MONISTAT 7 COMBO PACK APP) 100 & 2 MG-% (9GM) KIT Apply vaginally at bedtime x 7   rosuvastatin (CRESTOR) 40 MG tablet Takes 1 tablet everyday   Semaglutide,0.25 or 0.5MG/DOS, 2 MG/3ML SOPN Inject 0.5 mg into the skin once a week.   tiZANidine (ZANAFLEX) 4 MG capsule TAKE 1 CAPSULE BY MOUTH 3 TIMES DAILY AS NEEDED FOR MUSCLE SPASMS.   traZODone (DESYREL) 150 MG tablet Take 1/3-1 tablet daily for mood and sleep.   No current facility-administered medications on file prior to visit.    ROS: all negative except above.   Physical Exam:  There were no vitals taken for this visit.  General Appearance: Well nourished, in no apparent distress. Eyes: PERRLA, EOMs, conjunctiva no swelling or erythema Sinuses: No Frontal/maxillary tenderness ENT/Mouth: Ext aud canals clear, TMs without erythema, bulging. No erythema, swelling, or exudate on post pharynx.  Tonsils not swollen or erythematous. Hearing normal.  Neck: Supple, thyroid normal.  Respiratory: Respiratory effort normal, BS equal bilaterally without rales, rhonchi, wheezing or stridor.  Cardio: RRR with no MRGs. Brisk peripheral pulses without edema.  Abdomen: Soft, + BS.  Non tender, no guarding, rebound, hernias, masses. Lymphatics: Non tender without lymphadenopathy.  Musculoskeletal: Full ROM, 5/5 strength, normal gait.  Skin: Warm, dry without rashes, lesions, ecchymosis.  Neuro: Cranial nerves intact. Normal muscle tone, no cerebellar symptoms. Sensation intact.  Psych: Awake and oriented X  3, normal affect, Insight and Judgment appropriate.     Alycia Rossetti, NP 9:18 AM [facility]

## 2021-12-14 ENCOUNTER — Ambulatory Visit (INDEPENDENT_AMBULATORY_CARE_PROVIDER_SITE_OTHER): Payer: BC Managed Care – PPO | Admitting: Nurse Practitioner

## 2021-12-14 ENCOUNTER — Encounter: Payer: Self-pay | Admitting: Nurse Practitioner

## 2021-12-14 VITALS — BP 128/58 | HR 76 | Temp 97.5°F | Ht 68.0 in | Wt 212.0 lb

## 2021-12-14 DIAGNOSIS — R051 Acute cough: Secondary | ICD-10-CM | POA: Diagnosis not present

## 2021-12-14 DIAGNOSIS — F17200 Nicotine dependence, unspecified, uncomplicated: Secondary | ICD-10-CM | POA: Diagnosis not present

## 2021-12-14 DIAGNOSIS — E1165 Type 2 diabetes mellitus with hyperglycemia: Secondary | ICD-10-CM

## 2021-12-14 DIAGNOSIS — I10 Essential (primary) hypertension: Secondary | ICD-10-CM

## 2021-12-14 DIAGNOSIS — E669 Obesity, unspecified: Secondary | ICD-10-CM | POA: Diagnosis not present

## 2021-12-14 MED ORDER — DEXAMETHASONE SODIUM PHOSPHATE 10 MG/ML IJ SOLN
10.0000 mg | Freq: Once | INTRAMUSCULAR | Status: AC
  Administered 2021-12-14: 10 mg via INTRAMUSCULAR

## 2021-12-14 MED ORDER — VARENICLINE TARTRATE (STARTER) 0.5 MG X 11 & 1 MG X 42 PO TBPK
ORAL_TABLET | ORAL | 0 refills | Status: AC
Start: 2021-12-14 — End: ?

## 2021-12-14 MED ORDER — ALBUTEROL SULFATE HFA 108 (90 BASE) MCG/ACT IN AERS: 2.0000 | INHALATION_SPRAY | Freq: Four times a day (QID) | RESPIRATORY_TRACT | 2 refills | Status: DC | PRN

## 2021-12-14 NOTE — Patient Instructions (Signed)
Will begin Chantix to start smoking cessation  Varenicline Tablets What is this medication? VARENICLINE (var e NI kleen) helps you quit smoking. It reduces cravings for nicotine, the addictive substance found in tobacco. It is most effective when used in combination with a stop-smoking program. This medicine may be used for other purposes; ask your health care provider or pharmacist if you have questions. COMMON BRAND NAME(S): Chantix What should I tell my care team before I take this medication? They need to know if you have any of these conditions: Heart disease Frequently drink alcohol Kidney disease Mental health condition On hemodialysis Seizures History of stroke Suicidal thoughts, plans, or attempt by you or a family member An unusual or allergic reaction to varenicline, other medications, foods, dyes, or preservatives Pregnant or trying to get pregnant Breast-feeding How should I use this medication? Take this medication by mouth after eating. Take with a full glass of water. Follow the directions on the prescription label. Take your doses at regular intervals. Do not take your medication more often than directed. There are 3 ways you can use this medication to help you quit smoking; talk to your care team to decide which plan is right for you: 1) you can choose a quit date and start this medication 1 week before the quit date, or, 2) you can start taking this medication before you choose a quit date, and then pick a quit date between day 8 and 35 days of treatment, or, 3) if you are not sure that you are able or willing to quit smoking right away, start taking this medication and slowly decrease the amount you smoke as directed by your care team with the goal of being cigarette-free by week 12 of treatment. Stick to your plan; ask about support groups or other ways to help you remain cigarette-free. If you are motivated to quit smoking and did not succeed during a previous attempt  with this medication for reasons other than side effects, or if you returned to smoking after this treatment, speak with your care team about whether another course of this medication may be right for you. A special MedGuide will be given to you by the pharmacist with each prescription and refill. Be sure to read this information carefully each time. Talk to your care team about the use of this medication in children. This medication is not approved for use in children. Overdosage: If you think you have taken too much of this medicine contact a poison control center or emergency room at once. NOTE: This medicine is only for you. Do not share this medicine with others. What if I miss a dose? If you miss a dose, take it as soon as you can. If it is almost time for your next dose, take only that dose. Do not take double or extra doses. What may interact with this medication? Alcohol Insulin Other medications used to help people quit smoking Theophylline Warfarin This list may not describe all possible interactions. Give your health care provider a list of all the medicines, herbs, non-prescription drugs, or dietary supplements you use. Also tell them if you smoke, drink alcohol, or use illegal drugs. Some items may interact with your medicine. What should I watch for while using this medication? It is okay if you do not succeed at your attempt to quit and have a cigarette. You can still continue your quit attempt and keep using this medication as directed. Just throw away your cigarettes and get back to  your quit plan. Talk to your care team before using other treatments to quit smoking. Using this medication with other treatments to quit smoking may increase the risk for side effects compared to using a treatment alone. This medication may affect your coordination, reaction time, or judgment. Do not drive or operate machinery until you know how this medication affects you. Sit up or stand slowly to  reduce the risk of dizzy or fainting spells. Decrease the number of alcoholic beverages that you drink during treatment with this medication until you know if this medication affects your ability to tolerate alcohol. Some people have experienced increased drunkenness (intoxication), unusual or sometimes aggressive behavior, or no memory of things that have happened (amnesia) during treatment with this medication. You may do unusual sleep behaviors or activities you do not remember the day after taking this medication. Activities include driving, making or eating food, talking on the phone, sexual activity, or sleep walking. Stop taking this medication and call your care team right away if you find out you have done activities like this. Patients and their families should watch out for new or worsening depression or thoughts of suicide. Also watch out for sudden changes in feelings such as feeling anxious, agitated, panicky, irritable, hostile, aggressive, impulsive, severely restless, overly excited and hyperactive, or not being able to sleep. If this happens, call your care team. If you have diabetes, and you quit smoking, the effects of insulin may be increased. You may need to reduce your insulin dose. Check with your care team about how you should adjust your insulin dose. What side effects may I notice from receiving this medication? Side effects that you should report to your care team as soon as possible: Allergic reactions or angioedema--skin rash, itching or hives, swelling of the face, eyes, lips, tongue, arms, or legs, trouble swallowing or breathing Heart attack--pain or tightness in the chest, shoulders, arms, or jaw, nausea, shortness of breath, cold or clammy skin, feeling faint or lightheaded Mood and behavior changes--anxiety, nervousness, confusion, hallucinations, irritability, hostility, thoughts of suicide or self-harm, worsening mood, feelings of depression Redness, blistering,  peeling, or loosening of the skin, including inside the mouth Stroke--sudden numbness or weakness of the face, arm, or leg, trouble speaking, confusion, trouble walking, loss of balance or coordination, dizziness, severe headache, change in vision Seizures Side effects that usually do not require medical attention (report to your care team if they continue or are bothersome): Constipation Drowsiness Gas Nausea Trouble sleeping Upset stomach Vivid dreams or nightmares Vomiting This list may not describe all possible side effects. Call your doctor for medical advice about side effects. You may report side effects to FDA at 1-800-FDA-1088. Where should I keep my medication? Keep out of the reach of children and pets. Store at room temperature between 15 and 30 degrees C (59 and 86 degrees F). Throw away any unused medication after the expiration date. NOTE: This sheet is a summary. It may not cover all possible information. If you have questions about this medicine, talk to your doctor, pharmacist, or health care provider.  2023 Elsevier/Gold Standard (2020-11-24 00:00:00)   Managing the Challenge of Quitting Smoking Quitting smoking is a physical and mental challenge. You may have cravings, withdrawal symptoms, and temptation to smoke. Before quitting, work with your health care provider to make a plan that can help you manage quitting. Making a plan before you quit may keep you from smoking when you have the urge to smoke while trying to quit.  How to manage lifestyle changes Managing stress Stress can make you want to smoke, and wanting to smoke may cause stress. It is important to find ways to manage your stress. You could try some of the following: Practice relaxation techniques. Breathe slowly and deeply, in through your nose and out through your mouth. Listen to music. Soak in a bath or take a shower. Imagine a peaceful place or vacation. Get some support. Talk with family or  friends about your stress. Join a support group. Talk with a counselor or therapist. Get some physical activity. Go for a walk, run, or bike ride. Play a favorite sport. Practice yoga.  Medicines Talk with your health care provider about medicines that might help you deal with cravings and make quitting easier for you. Relationships Social situations can be difficult when you are quitting smoking. To manage this, you can: Avoid parties and other social situations where people might be smoking. Avoid alcohol. Leave right away if you have the urge to smoke. Explain to your family and friends that you are quitting smoking. Ask for support and let them know you might be a bit grumpy. Plan activities where smoking is not an option. General instructions Be aware that many people gain weight after they quit smoking. However, not everyone does. To keep from gaining weight, have a plan in place before you quit, and stick to the plan after you quit. Your plan should include: Eating healthy snacks. When you have a craving, it may help to: Eat popcorn, or try carrots, celery, or other cut vegetables. Chew sugar-free gum. Changing how you eat. Eat small portion sizes at meals. Eat 4-6 small meals throughout the day instead of 1-2 large meals a day. Be mindful when you eat. You should avoid watching television or doing other things that might distract you as you eat. Exercising regularly. Make time to exercise each day. If you do not have time for a long workout, do short bouts of exercise for 5-10 minutes several times a day. Do some form of strengthening exercise, such as weight lifting. Do some exercise that gets your heart beating and causes you to breathe deeply, such as walking fast, running, swimming, or biking. This is very important. Drinking plenty of water or other low-calorie or no-calorie drinks. Drink enough fluid to keep your urine pale yellow.  How to recognize withdrawal  symptoms Your body and mind may experience discomfort as you try to get used to not having nicotine in your system. These effects are called withdrawal symptoms. They may include: Feeling hungrier than normal. Having trouble concentrating. Feeling irritable or restless. Having trouble sleeping. Feeling depressed. Craving a cigarette. These symptoms may surprise you, but they are normal to have when quitting smoking. To manage withdrawal symptoms: Avoid places, people, and activities that trigger your cravings. Remember why you want to quit. Get plenty of sleep. Avoid coffee and other drinks that contain caffeine. These may worsen some of your symptoms. How to manage cravings Come up with a plan for how to deal with your cravings. The plan should include the following: A definition of the specific situation you want to deal with. An activity or action you will take to replace smoking. A clear idea for how this action will help. The name of someone who could help you with this. Cravings usually last for 5-10 minutes. Consider taking the following actions to help you with your plan to deal with cravings: Keep your mouth busy. Chew sugar-free gum. Suck  on hard candies or a straw. Brush your teeth. Keep your hands and body busy. Change to a different activity right away. Squeeze or play with a ball. Do an activity or a hobby, such as making bead jewelry, practicing needlepoint, or working with wood. Mix up your normal routine. Take a short exercise break. Go for a quick walk, or run up and down stairs. Focus on doing something kind or helpful for someone else. Call a friend or family member to talk during a craving. Join a support group. Contact a quitline. Where to find support To get help or find a support group: Call the National Cancer Institute's Smoking Quitline: 1-800-QUIT-NOW 954-239-4594) Text QUIT to SmokefreeTXT: 263785 Where to find more information Visit these websites to  find more information on quitting smoking: U.[place]. Department of Health and Human Services: www.smokefree.gov American Lung Association: www.freedomfromsmoking.org Centers for Disease Control and Prevention (CDC): FootballExhibition.com.br American Heart Association: www.heart.org Contact a health care provider if: You want to change your plan for quitting. The medicines you are taking are not helping. Your eating feels out of control or you cannot sleep. You feel depressed or become very anxious. Summary Quitting smoking is a physical and mental challenge. You will face cravings, withdrawal symptoms, and temptation to smoke again. Preparation can help you as you go through these challenges. Try different techniques to manage stress, handle social situations, and prevent weight gain. You can deal with cravings by keeping your mouth busy (such as by chewing gum), keeping your hands and body busy, calling family or friends, or contacting a quitline for people who want to quit smoking. You can deal with withdrawal symptoms by avoiding places where people smoke, getting plenty of rest, and avoiding drinks that contain caffeine. This information is not intended to replace advice given to you by your health care provider. Make sure you discuss any questions you have with your health care provider. Document Revised: 12/23/2020 Document Reviewed: 12/23/2020 Elsevier Patient Education  2023 [employer].

## 2022-01-10 NOTE — Progress Notes (Deleted)
3 MONTH FOLLOW UP  Assessment and Plan:   Type 2 diabetes mellitus with hyperglycemia, without long-term current use of insulin (HCC) Currently prescribed metformin 2000 mg daily  Had stopped Ozempic and would like to restart- will start at 0.5 mg dose and increase as tolerated Glipizide 5 mg twice a day Education: Reviewed 'ABCs' of diabetes management (respective goals in parentheses):  A1C (<7), blood pressure (<130/80), and cholesterol (LDL <70) Eye Exam yearly and Dental Exam every 6 months- requested to forward diabetic eye exam report - reminded to schedule  Dietary recommendations Physical Activity recommendations -     COMPLETE METABOLIC PANEL WITH GFR -     Hemoglobin A1c  Hyperlipidemia associated with type 2 diabetes mellitus (HCC) Rosuvastatin 40 mg and zetia 10 mg daily LDL goal <70 Continue low cholesterol diet and exercise.  Check lipid panel.  -     Lipid panel   CKD II associated with T2DM (HCC) Emphasized better glucose control, continue ARB  Diabetic peripheral neuropathy (HCC) Glucose control, check feet daily, OV if any wounds  Diabetic retinopathy ([place]) Needs better glucose control, reminded to schedule diabetic eye exam  Type 2 DM with Obesity (BMI 30.0-34.9)(HCC) Long discussion about weight loss, diet, and exercise Recommended diet heavy in fruits and veggies and low in animal meats, cheeses, and dairy products, appropriate calorie intake Discussed appropriate weight for height and initial goal (200 lb) Follow up at next visit Ozempic - is restarting  Major depressive disorder in partial remission, unspecified whether recurrent ([place]) Doing fairly with trazodone; she will request previous psych records Lifestyle discussed: diet/exerise, sleep hygiene, stress management, hydration  Hypertension Continue losartan 25 mg for BP and renal protection Monitor blood pressure at home; call if consistently over 130/80 Continue DASH diet.   Reminder to go  to the ER if any CP, SOB, nausea, dizziness, severe HA, changes vision/speech, left arm numbness and tingling and jaw pain.  Medication management -     CBC with Differential/Platelet -     COMPLETE METABOLIC PANEL WITH GFR - TSH  Vitamin D Deficiency Continue Vit D supplementation to maintain value in therapeutic level of 60-100  - Vit D  Discussed med's effects and SE's. Further disposition pending lab results. Over 30 minutes of exam, counseling, chart review, and complex, high level critical decision making was performed this visit.   Future Appointments  Date Time Provider [place]  01/11/2022  4:00 PM Alycia Rossetti, NP [facility] None  02/27/2022  3:00 PM Darrol Jump, NP [facility] None    HPI  56 y.o. female  presents for 3 month follow up on T2DM, hld, CKD, obesity, smoking, depression, vit D def.    She does have hx of recurrent major depression; taking trazodone 50 mg with good results, working with therapist re her husband's situation- kidney failure.   She is an intermittent smoker with 50+ pack year history, recently quit again 05/14/2021 with chantix and restarted smoking 1 month ago 1 ppd.   Patient was referred to cardiology due to report of chest pain episode with sig coronary risk factors, possible EKG changes. She underwent coronary CT by Dr. Irish Lack 03/28/2021 that showed CAD-RADS 1, minimal, recommended for medication management and risk factor reduction.   Did incidentally show some R sided pulm nodules ~3 mm, recommended for 1 year follow up.   BMI is There is no height or weight on file to calculate BMI., she has been working on diet, exercise is limited. 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 Has had limited benefit with phentermine/topamax/welbutrin. She stopped her Ozempic 4 months  ago but would like to restart Wt Readings from Last 3 Encounters:  12/14/21 212 lb (96.2 kg)  10/05/21 221  lb 9.6 oz (100.5 kg)  06/08/21 217 lb (98.4 kg)    She has not been checking BPs at home, reports was been normal at dentist, Today their BP is    BP Readings from Last 3 Encounters:  12/14/21 (!) 128/58  10/05/21 130/60  06/08/21 128/78  She does not workout. She denies chest pain, shortness of breath, dizziness.    She is on cholesterol medication (rosuvastatin 40 mg daily and zetia 10 mg). Her cholesterol is not at goal. The cholesterol last visit was:   Lab Results  Component Value Date   CHOL 128 10/05/2021   HDL 63 10/05/2021   LDLCALC 44 10/05/2021   TRIG 129 10/05/2021   CHOLHDL 2.0 10/05/2021   She has been working on diet and exercise for T2DM (prescribed metformin ER 1000 mg BID  and, glipizide 5 mg twice a day), she is on bASA, she is on ACE/ARB, she is on statin and denies foot ulcerations, increased appetite, nausea, polydipsia, polyuria, visual disturbances, vomiting, and weight loss.  She does have mild tingling burning in bil toes.  She does have a meter, reading high  Stop Ozempic 4 months ago but does wish to restart Last A1C in the office was:  Lab Results  Component Value Date   HGBA1C 13.3 (H) 10/05/2021   CKD II on losartan 25 mg. Last GFR: Lab Results  Component Value Date   EGFR 59 (L) 10/05/2021   Lab Results  Component Value Date   MICRALBCREAT 19 02/23/2021   Patient is on Vitamin D supplement Lab Results  Component Value Date   VD25OH 49 02/23/2021       Current Medications:  Current Outpatient Medications on File Prior to Visit  Medication Sig Dispense Refill   albuterol (VENTOLIN HFA) 108 (90 Base) MCG/ACT inhaler Inhale 2 puffs into the lungs every 6 (six) hours as needed for wheezing or shortness of breath. 8 g 2   aspirin EC 81 MG tablet Take 81 mg by mouth daily. Swallow whole.     Cholecalciferol (VITAMIN D3) 250 MCG (10000 UT) TABS Take one tablet daily 30 tablet 0   Continuous Blood Gluc Sensor (FREESTYLE LIBRE 3 SENSOR) MISC  Place 1 sensor on the skin every 14 days. Use to check glucose continuously 2 each 3   ezetimibe (ZETIA) 10 MG tablet Take 1 tab daily for cholesterol and heart attack risk reduction. 90 tablet 3   glipiZIDE (GLUCOTROL) 5 MG tablet TAKE 1/2 TO 1 (ONE-HALF TO ONE) TABLET BY MOUTH 2 TO 3 TIMES DAILY WITH MEALS FOR  DIABETES 90 tablet 0   glucose blood (ONETOUCH VERIO) test strip Check fasting sugar daily (prior to breakfast). 100 each 12   losartan (COZAAR) 25 MG tablet Take 1 tab daily for blood pressure and kidney protection. 90 tablet 3   Lysine (GNP L-LYSINE) 600 MG TABS Take one tablet daily     Magnesium 500 MG TABS Take one tablet daily 30 tablet    metFORMIN (GLUCOPHAGE-XR) 500 MG 24 hr tablet TAKE 2 TABLETS(1000 MG) BY MOUTH TWICE DAILY WITH A MEAL (Patient taking differently: Takes 2 tablets in the evening) 360 tablet 1   rosuvastatin (CRESTOR) 40 MG tablet Takes 1 tablet  everyday 90 tablet 3   Semaglutide,0.25 or 0.5MG /DOS, 2 MG/3ML SOPN Inject 0.5 mg into the skin once a week. (Patient not taking: Reported on 12/14/2021) 3 mL 0   tiZANidine (ZANAFLEX) 4 MG capsule TAKE 1 CAPSULE BY MOUTH 3 TIMES DAILY AS NEEDED FOR MUSCLE SPASMS. 270 capsule 1   traZODone (DESYREL) 150 MG tablet Take 1/3-1 tablet daily for mood and sleep. 90 tablet 3   Varenicline Tartrate, Starter, (CHANTIX STARTING MONTH PAK) 0.5 MG X 11 & 1 MG X 42 TBPK As directed- 0.5 mg for 11 days then 1 mg for 42 days 53 each 0   Zinc 50 MG TABS Take by mouth.     No current facility-administered medications on file prior to visit.   Allergies:  Allergies  Allergen Reactions   Atorvastatin     Myalgias   Buspirone     "felt like I was going to pass out"    Pravastatin    Medical History:  She has Type 2 diabetes mellitus with hyperglycemia, without long-term current use of insulin ([place]); Hypertension; Hyperlipidemia associated with type 2 diabetes mellitus ([place]); Obesity (BMI 30.0-34.9); Major depression in partial  remission ([place]); Discoloration and thickening of nails both feet; Chronic venous insufficiency; Vitamin D deficiency; CKD stage 2 due to type 2 diabetes mellitus ([place]); Diabetic peripheral neuropathy associated with type 2 diabetes mellitus ([place]); TMJ (temporomandibular joint disorder); Former smoker (quit 05/14/2021, 50 pack year hx); Diabetic retinopathy ([place]); Recurrent low back pain episodes; Financial difficulty; At risk for medication nonadherence; and Pulmonary nodules on their problem list.   Surgical History:  She has a past surgical history that includes Cholecystectomy (2005) and Cesarean section (2000). Family History:  Herfamily history includes Renal Disease in her son. She was adopted. Social History:  She reports that she quit smoking about 7 months ago. Her smoking use included cigarettes. She started smoking about 41 years ago. She has a 50.00 pack-year smoking history. She has never used smokeless tobacco. She reports current alcohol use. She reports that she does not use drugs.   Review of Systems: Review of Systems  Constitutional:  Negative for malaise/fatigue and weight loss.  HENT:  Negative for hearing loss and tinnitus.   Eyes:  Negative for blurred vision and double vision.  Respiratory:  Negative for cough, shortness of breath and wheezing.   Cardiovascular:  Negative for chest pain, palpitations, orthopnea, claudication and leg swelling (chronic, mild, bil).  Gastrointestinal:  Negative for abdominal pain, blood in stool, constipation, diarrhea, heartburn, melena, nausea and vomiting.  Genitourinary: Negative.   Musculoskeletal:  Positive for back pain (lumbar, ortho following) and joint pain (L shoulder). Negative for myalgias.  Skin:  Negative for rash.  Neurological:  Positive for tingling (bil feet). Negative for dizziness, sensory change, weakness and headaches.  Endo/Heme/Allergies:  Negative for polydipsia.  Psychiatric/Behavioral:  Negative for depression,  hallucinations, substance abuse and suicidal ideas. The patient is not nervous/anxious and does not have insomnia.   All other systems reviewed and are negative.   Physical Exam: Estimated body mass index is 32.23 kg/m as calculated from the following:   Height as of 12/14/21: 5\' 8"  (1.727 m).   Weight as of 12/14/21: 212 lb (96.2 kg). There were no vitals taken for this visit. General Appearance: Well nourished, appears older than stated age, in no apparent distress.  Eyes: PERRLA, EOMs, conjunctiva no swelling or erythema Sinuses: No Frontal/maxillary tenderness  ENT/Mouth: Ext aud canals clear, normal light reflex with TMs without  erythema, bulging. Good dentition. No erythema, swelling, or exudate on post pharynx. Tonsils not swollen or erythematous. Hearing normal. Missing extensive teeth.  Neck: Supple, thyroid normal. No bruits  Respiratory: Respiratory effort normal, BS equal bilaterally without rales, rhonchi, wheezing or stridor.  Cardio: RRR without murmurs, rubs or gallops. Brisk peripheral pulses with 1+ bilateral non-pitting edema to ankles.  Abdomen: Soft, nontender, no guarding, rebound, hernias, masses, or organomegaly.  Lymphatics: Non tender without lymphadenopathy.  Musculoskeletal: Full ROM bil lower extremities, 5/5 strength, and normal gait. L shoulder with limited abduction and external rotation with pain.  Skin: Warm, dry without rashes, lesions, ecchymosis.  Neuro: Cranial nerves intact, reflexes equal bilaterally. Normal muscle tone, no cerebellar symptoms.  Psych: Awake and oriented X 3, normal affect, Insight and Judgment appropriate.   Iceis Knab Mikki Santee, DNP, AGNP-C 1:23 PM [facility]

## 2022-01-11 ENCOUNTER — Ambulatory Visit: Payer: BC Managed Care – PPO | Admitting: Nurse Practitioner

## 2022-01-17 NOTE — Progress Notes (Deleted)
3 MONTH FOLLOW UP  Assessment and Plan:   Type 2 diabetes mellitus with hyperglycemia, without long-term current use of insulin (HCC) Currently prescribed metformin 2000 mg daily  Had stopped Ozempic and would like to restart- will start at 0.5 mg dose and increase as tolerated Glipizide 5 mg twice a day Education: Reviewed 'ABCs' of diabetes management (respective goals in parentheses):  A1C (<7), blood pressure (<130/80), and cholesterol (LDL <70) Eye Exam yearly and Dental Exam every 6 months- requested to forward diabetic eye exam report - reminded to schedule  Dietary recommendations Physical Activity recommendations -     COMPLETE METABOLIC PANEL WITH GFR -     Hemoglobin A1c  Hyperlipidemia associated with type 2 diabetes mellitus (HCC) Rosuvastatin 40 mg and zetia 10 mg daily LDL goal <70 Continue low cholesterol diet and exercise.  Check lipid panel.  -     Lipid panel   CKD II associated with T2DM (HCC) Emphasized better glucose control, continue ARB  Diabetic peripheral neuropathy (HCC) Glucose control, check feet daily, OV if any wounds  Diabetic retinopathy ([place]) Needs better glucose control, reminded to schedule diabetic eye exam  Type 2 DM with Obesity (BMI 30.0-34.9)(HCC) Long discussion about weight loss, diet, and exercise Recommended diet heavy in fruits and veggies and low in animal meats, cheeses, and dairy products, appropriate calorie intake Discussed appropriate weight for height and initial goal (200 lb) Follow up at next visit Ozempic - is restarting  Major depressive disorder in partial remission, unspecified whether recurrent ([place]) Doing fairly with trazodone; she will request previous psych records Lifestyle discussed: diet/exerise, sleep hygiene, stress management, hydration  Hypertension Continue losartan 25 mg for BP and renal protection Monitor blood pressure at home; call if consistently over 130/80 Continue DASH diet.   Reminder to go  to the ER if any CP, SOB, nausea, dizziness, severe HA, changes vision/speech, left arm numbness and tingling and jaw pain.  Medication management -     CBC with Differential/Platelet -     COMPLETE METABOLIC PANEL WITH GFR - TSH  Vitamin D Deficiency Continue Vit D supplementation to maintain value in therapeutic level of 60-100  - Vit D  Discussed med's effects and SE's. Further disposition pending lab results. Over 30 minutes of exam, counseling, chart review, and complex, high level critical decision making was performed this visit.   Future Appointments  Date Time Provider [place]  01/18/2022  4:00 PM Alycia Rossetti, NP [facility] None  02/27/2022  3:00 PM Darrol Jump, NP [facility] None    HPI  57 y.o. female  presents for 3 month follow up on T2DM, hld, CKD, obesity, smoking, depression, vit D def.    She does have hx of recurrent major depression; taking trazodone 50 mg with good results, working with therapist re her husband's situation- kidney failure.   She is an intermittent smoker with 50+ pack year history, recently quit again 05/14/2021 with chantix and restarted smoking 1 month ago 1 ppd.   Patient was referred to cardiology due to report of chest pain episode with sig coronary risk factors, possible EKG changes. She underwent coronary CT by Dr. Irish Lack 03/28/2021 that showed CAD-RADS 1, minimal, recommended for medication management and risk factor reduction.   Did incidentally show some R sided pulm nodules ~3 mm, recommended for 1 year follow up.   BMI is There is no height or weight on file to calculate BMI., she has been working on diet, exercise is limited. 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 Has had limited benefit with phentermine/topamax/welbutrin. She stopped her Ozempic 4 months  ago but would like to restart Wt Readings from Last 3 Encounters:  12/14/21 212 lb (96.2 kg)  10/05/21 221  lb 9.6 oz (100.5 kg)  06/08/21 217 lb (98.4 kg)    She has not been checking BPs at home, reports was been normal at dentist, Today their BP is    BP Readings from Last 3 Encounters:  12/14/21 (!) 128/58  10/05/21 130/60  06/08/21 128/78  She does not workout. She denies chest pain, shortness of breath, dizziness.    She is on cholesterol medication (rosuvastatin 40 mg daily and zetia 10 mg). Her cholesterol is not at goal. The cholesterol last visit was:   Lab Results  Component Value Date   CHOL 128 10/05/2021   HDL 63 10/05/2021   LDLCALC 44 10/05/2021   TRIG 129 10/05/2021   CHOLHDL 2.0 10/05/2021   She has been working on diet and exercise for T2DM (prescribed metformin ER 1000 mg BID  and, glipizide 5 mg twice a day), she is on bASA, she is on ACE/ARB, she is on statin and denies foot ulcerations, increased appetite, nausea, polydipsia, polyuria, visual disturbances, vomiting, and weight loss.  She does have mild tingling burning in bil toes.  She does have a meter, reading high  Stop Ozempic 4 months ago but does wish to restart Last A1C in the office was:  Lab Results  Component Value Date   HGBA1C 13.3 (H) 10/05/2021   CKD II on losartan 25 mg. Last GFR: Lab Results  Component Value Date   EGFR 59 (L) 10/05/2021   Lab Results  Component Value Date   MICRALBCREAT 19 02/23/2021   Patient is on Vitamin D supplement Lab Results  Component Value Date   VD25OH 49 02/23/2021       Current Medications:  Current Outpatient Medications on File Prior to Visit  Medication Sig Dispense Refill   albuterol (VENTOLIN HFA) 108 (90 Base) MCG/ACT inhaler Inhale 2 puffs into the lungs every 6 (six) hours as needed for wheezing or shortness of breath. 8 g 2   aspirin EC 81 MG tablet Take 81 mg by mouth daily. Swallow whole.     Cholecalciferol (VITAMIN D3) 250 MCG (10000 UT) TABS Take one tablet daily 30 tablet 0   Continuous Blood Gluc Sensor (FREESTYLE LIBRE 3 SENSOR) MISC  Place 1 sensor on the skin every 14 days. Use to check glucose continuously 2 each 3   ezetimibe (ZETIA) 10 MG tablet Take 1 tab daily for cholesterol and heart attack risk reduction. 90 tablet 3   glipiZIDE (GLUCOTROL) 5 MG tablet TAKE 1/2 TO 1 (ONE-HALF TO ONE) TABLET BY MOUTH 2 TO 3 TIMES DAILY WITH MEALS FOR  DIABETES 90 tablet 0   glucose blood (ONETOUCH VERIO) test strip Check fasting sugar daily (prior to breakfast). 100 each 12   losartan (COZAAR) 25 MG tablet Take 1 tab daily for blood pressure and kidney protection. 90 tablet 3   Lysine (GNP L-LYSINE) 600 MG TABS Take one tablet daily     Magnesium 500 MG TABS Take one tablet daily 30 tablet    metFORMIN (GLUCOPHAGE-XR) 500 MG 24 hr tablet TAKE 2 TABLETS(1000 MG) BY MOUTH TWICE DAILY WITH A MEAL (Patient taking differently: Takes 2 tablets in the evening) 360 tablet 1   rosuvastatin (CRESTOR) 40 MG tablet Takes 1 tablet  everyday 90 tablet 3   Semaglutide,0.25 or 0.5MG /DOS, 2 MG/3ML SOPN Inject 0.5 mg into the skin once a week. (Patient not taking: Reported on 12/14/2021) 3 mL 0   tiZANidine (ZANAFLEX) 4 MG capsule TAKE 1 CAPSULE BY MOUTH 3 TIMES DAILY AS NEEDED FOR MUSCLE SPASMS. 270 capsule 1   traZODone (DESYREL) 150 MG tablet Take 1/3-1 tablet daily for mood and sleep. 90 tablet 3   Varenicline Tartrate, Starter, (CHANTIX STARTING MONTH PAK) 0.5 MG X 11 & 1 MG X 42 TBPK As directed- 0.5 mg for 11 days then 1 mg for 42 days 53 each 0   Zinc 50 MG TABS Take by mouth.     No current facility-administered medications on file prior to visit.   Allergies:  Allergies  Allergen Reactions   Atorvastatin     Myalgias   Buspirone     "felt like I was going to pass out"    Pravastatin    Medical History:  She has Type 2 diabetes mellitus with hyperglycemia, without long-term current use of insulin ([place]); Hypertension; Hyperlipidemia associated with type 2 diabetes mellitus ([place]); Obesity (BMI 30.0-34.9); Major depression in partial  remission ([place]); Discoloration and thickening of nails both feet; Chronic venous insufficiency; Vitamin D deficiency; CKD stage 2 due to type 2 diabetes mellitus ([place]); Diabetic peripheral neuropathy associated with type 2 diabetes mellitus ([place]); TMJ (temporomandibular joint disorder); Former smoker (quit 05/14/2021, 50 pack year hx); Diabetic retinopathy ([place]); Recurrent low back pain episodes; Financial difficulty; At risk for medication nonadherence; and Pulmonary nodules on their problem list.   Surgical History:  She has a past surgical history that includes Cholecystectomy (2005) and Cesarean section (2000). Family History:  Herfamily history includes Renal Disease in her son. She was adopted. Social History:  She reports that she quit smoking about 8 months ago. Her smoking use included cigarettes. She started smoking about 41 years ago. She has a 50.00 pack-year smoking history. She has never used smokeless tobacco. She reports current alcohol use. She reports that she does not use drugs.   Review of Systems: Review of Systems  Constitutional:  Negative for malaise/fatigue and weight loss.  HENT:  Negative for hearing loss and tinnitus.   Eyes:  Negative for blurred vision and double vision.  Respiratory:  Negative for cough, shortness of breath and wheezing.   Cardiovascular:  Negative for chest pain, palpitations, orthopnea, claudication and leg swelling (chronic, mild, bil).  Gastrointestinal:  Negative for abdominal pain, blood in stool, constipation, diarrhea, heartburn, melena, nausea and vomiting.  Genitourinary: Negative.   Musculoskeletal:  Positive for back pain (lumbar, ortho following) and joint pain (L shoulder). Negative for myalgias.  Skin:  Negative for rash.  Neurological:  Positive for tingling (bil feet). Negative for dizziness, sensory change, weakness and headaches.  Endo/Heme/Allergies:  Negative for polydipsia.  Psychiatric/Behavioral:  Negative for depression,  hallucinations, substance abuse and suicidal ideas. The patient is not nervous/anxious and does not have insomnia.   All other systems reviewed and are negative.   Physical Exam: Estimated body mass index is 32.23 kg/m as calculated from the following:   Height as of 12/14/21: 5\' 8"  (1.727 m).   Weight as of 12/14/21: 212 lb (96.2 kg). There were no vitals taken for this visit. General Appearance: Well nourished, appears older than stated age, in no apparent distress.  Eyes: PERRLA, EOMs, conjunctiva no swelling or erythema Sinuses: No Frontal/maxillary tenderness  ENT/Mouth: Ext aud canals clear, normal light reflex with TMs without  erythema, bulging. Good dentition. No erythema, swelling, or exudate on post pharynx. Tonsils not swollen or erythematous. Hearing normal. Missing extensive teeth.  Neck: Supple, thyroid normal. No bruits  Respiratory: Respiratory effort normal, BS equal bilaterally without rales, rhonchi, wheezing or stridor.  Cardio: RRR without murmurs, rubs or gallops. Brisk peripheral pulses with 1+ bilateral non-pitting edema to ankles.  Abdomen: Soft, nontender, no guarding, rebound, hernias, masses, or organomegaly.  Lymphatics: Non tender without lymphadenopathy.  Musculoskeletal: Full ROM bil lower extremities, 5/5 strength, and normal gait. L shoulder with limited abduction and external rotation with pain.  Skin: Warm, dry without rashes, lesions, ecchymosis.  Neuro: Cranial nerves intact, reflexes equal bilaterally. Normal muscle tone, no cerebellar symptoms.  Psych: Awake and oriented X 3, normal affect, Insight and Judgment appropriate.   Alycia Rossetti, DNP, AGNP-C 9:26 AM [facility]

## 2022-01-18 ENCOUNTER — Ambulatory Visit: Payer: BC Managed Care – PPO | Admitting: Nurse Practitioner

## 2022-01-18 DIAGNOSIS — Z79899 Other long term (current) drug therapy: Secondary | ICD-10-CM

## 2022-01-18 DIAGNOSIS — E1122 Type 2 diabetes mellitus with diabetic chronic kidney disease: Secondary | ICD-10-CM

## 2022-01-18 DIAGNOSIS — E559 Vitamin D deficiency, unspecified: Secondary | ICD-10-CM

## 2022-01-18 DIAGNOSIS — E875 Hyperkalemia: Secondary | ICD-10-CM

## 2022-01-18 DIAGNOSIS — E11319 Type 2 diabetes mellitus with unspecified diabetic retinopathy without macular edema: Secondary | ICD-10-CM

## 2022-01-18 DIAGNOSIS — E1142 Type 2 diabetes mellitus with diabetic polyneuropathy: Secondary | ICD-10-CM

## 2022-01-18 DIAGNOSIS — F324 Major depressive disorder, single episode, in partial remission: Secondary | ICD-10-CM

## 2022-01-18 DIAGNOSIS — E1169 Type 2 diabetes mellitus with other specified complication: Secondary | ICD-10-CM

## 2022-01-18 DIAGNOSIS — E1165 Type 2 diabetes mellitus with hyperglycemia: Secondary | ICD-10-CM

## 2022-01-18 DIAGNOSIS — I10 Essential (primary) hypertension: Secondary | ICD-10-CM

## 2022-02-10 ENCOUNTER — Encounter: Payer: Self-pay | Admitting: Nurse Practitioner

## 2022-02-10 ENCOUNTER — Other Ambulatory Visit: Payer: Self-pay | Admitting: Nurse Practitioner

## 2022-02-10 DIAGNOSIS — R051 Acute cough: Secondary | ICD-10-CM

## 2022-02-10 DIAGNOSIS — M62830 Muscle spasm of back: Secondary | ICD-10-CM

## 2022-02-10 DIAGNOSIS — E1165 Type 2 diabetes mellitus with hyperglycemia: Secondary | ICD-10-CM

## 2022-02-10 MED ORDER — GLIPIZIDE 5 MG PO TABS: ORAL_TABLET | ORAL | 0 refills | Status: DC

## 2022-02-10 MED ORDER — ROSUVASTATIN CALCIUM 40 MG PO TABS: ORAL_TABLET | ORAL | 3 refills | Status: AC

## 2022-02-10 MED ORDER — METFORMIN HCL ER 500 MG PO TB24: ORAL_TABLET | ORAL | 1 refills | Status: AC

## 2022-02-10 MED ORDER — EZETIMIBE 10 MG PO TABS: ORAL_TABLET | ORAL | 3 refills | Status: AC

## 2022-02-10 MED ORDER — TIZANIDINE HCL 4 MG PO CAPS: ORAL_CAPSULE | ORAL | 1 refills | Status: DC

## 2022-02-10 MED ORDER — ALBUTEROL SULFATE HFA 108 (90 BASE) MCG/ACT IN AERS: 2.0000 | INHALATION_SPRAY | Freq: Four times a day (QID) | RESPIRATORY_TRACT | 2 refills | Status: AC | PRN

## 2022-02-10 MED ORDER — TRAZODONE HCL 150 MG PO TABS: ORAL_TABLET | ORAL | 3 refills | Status: AC

## 2022-02-10 MED ORDER — FREESTYLE LIBRE 3 SENSOR MISC: 3 refills | Status: DC

## 2022-02-10 MED ORDER — ONETOUCH VERIO VI STRP: ORAL_STRIP | 12 refills | Status: AC

## 2022-02-10 MED ORDER — LOSARTAN POTASSIUM 25 MG PO TABS: ORAL_TABLET | ORAL | 3 refills | Status: DC

## 2022-02-11 ENCOUNTER — Other Ambulatory Visit: Payer: Self-pay | Admitting: Nurse Practitioner

## 2022-02-11 DIAGNOSIS — E1165 Type 2 diabetes mellitus with hyperglycemia: Secondary | ICD-10-CM

## 2022-02-20 LAB — HM MAMMOGRAPHY

## 2022-02-22 ENCOUNTER — Encounter: Payer: Self-pay | Admitting: Internal Medicine

## 2022-02-27 ENCOUNTER — Encounter: Payer: BC Managed Care – PPO | Admitting: Nurse Practitioner

## 2022-03-27 LAB — EXTERNAL GENERIC LAB PROCEDURE: COLOGUARD: NEGATIVE

## 2022-03-27 LAB — COLOGUARD: COLOGUARD: NEGATIVE

## 2022-04-11 ENCOUNTER — Ambulatory Visit (INDEPENDENT_AMBULATORY_CARE_PROVIDER_SITE_OTHER): Payer: BC Managed Care – PPO | Admitting: Nurse Practitioner

## 2022-04-11 ENCOUNTER — Encounter: Payer: Self-pay | Admitting: Nurse Practitioner

## 2022-04-11 VITALS — BP 140/70 | HR 76 | Temp 97.6°F | Ht 67.5 in | Wt 211.2 lb

## 2022-04-11 DIAGNOSIS — E559 Vitamin D deficiency, unspecified: Secondary | ICD-10-CM | POA: Diagnosis not present

## 2022-04-11 DIAGNOSIS — Z13220 Encounter for screening for lipoid disorders: Secondary | ICD-10-CM | POA: Diagnosis not present

## 2022-04-11 DIAGNOSIS — Z79899 Other long term (current) drug therapy: Secondary | ICD-10-CM | POA: Diagnosis not present

## 2022-04-11 DIAGNOSIS — Z1211 Encounter for screening for malignant neoplasm of colon: Secondary | ICD-10-CM

## 2022-04-11 DIAGNOSIS — Z0000 Encounter for general adult medical examination without abnormal findings: Secondary | ICD-10-CM | POA: Diagnosis not present

## 2022-04-11 DIAGNOSIS — E1165 Type 2 diabetes mellitus with hyperglycemia: Secondary | ICD-10-CM

## 2022-04-11 DIAGNOSIS — F324 Major depressive disorder, single episode, in partial remission: Secondary | ICD-10-CM

## 2022-04-11 DIAGNOSIS — Z1389 Encounter for screening for other disorder: Secondary | ICD-10-CM

## 2022-04-11 DIAGNOSIS — F17200 Nicotine dependence, unspecified, uncomplicated: Secondary | ICD-10-CM

## 2022-04-11 DIAGNOSIS — E669 Obesity, unspecified: Secondary | ICD-10-CM

## 2022-04-11 DIAGNOSIS — Z136 Encounter for screening for cardiovascular disorders: Secondary | ICD-10-CM | POA: Diagnosis not present

## 2022-04-11 DIAGNOSIS — I10 Essential (primary) hypertension: Secondary | ICD-10-CM

## 2022-04-11 DIAGNOSIS — E11319 Type 2 diabetes mellitus with unspecified diabetic retinopathy without macular edema: Secondary | ICD-10-CM

## 2022-04-11 DIAGNOSIS — Z131 Encounter for screening for diabetes mellitus: Secondary | ICD-10-CM

## 2022-04-11 DIAGNOSIS — E1169 Type 2 diabetes mellitus with other specified complication: Secondary | ICD-10-CM

## 2022-04-11 DIAGNOSIS — L608 Other nail disorders: Secondary | ICD-10-CM

## 2022-04-11 DIAGNOSIS — Z0001 Encounter for general adult medical examination with abnormal findings: Secondary | ICD-10-CM

## 2022-04-11 DIAGNOSIS — I872 Venous insufficiency (chronic) (peripheral): Secondary | ICD-10-CM

## 2022-04-11 DIAGNOSIS — E66811 Obesity, class 1: Secondary | ICD-10-CM

## 2022-04-11 MED ORDER — MOUNJARO 2.5 MG/0.5ML ~~LOC~~ SOAJ: 2.5000 mg | SUBCUTANEOUS | 0 refills | Status: DC

## 2022-04-11 NOTE — Progress Notes (Signed)
Complete Physical  Assessment and Plan:  Encounter for Annual Physical Exam with abnormal findings Due annually  Health Maintenance reviewed Healthy lifestyle reviewed and goals set  Type 2 diabetes mellitus with hyperglycemia, without long-term current use of insulin (HCC) Start Mounjaro  Continue Metformin Education: Reviewed 'ABCs' of diabetes management  Discussed goals to be met and/or maintained include A1C (<7) Blood pressure (<130/80) Cholesterol (LDL <70) Continue Eye Exam yearly  Continue Dental Exam Q6 mo Discussed dietary recommendations Discussed Physical Activity recommendations Check A1C  Hyperlipidemia associated with type 2 diabetes mellitus (HCC) Continue Rosuvastatin Discussed lifestyle modifications. Recommended diet heavy in fruits and veggies, omega 3's. Decrease consumption of animal meats, cheeses, and dairy products. Remain active and exercise as tolerated. Continue to monitor. Check lipids/TSH  Obesity (BMI 30.0-34.9) Discussed appropriate BMI Diet modification. Physical activity. Encouraged/praised to build confidence.  Major depressive disorder in partial remission, unspecified whether recurrent (HCC) Continue trazodone and therapy  Lifestyle discussed: diet/exerise, sleep hygiene, stress management, hydration  Hypertension Discussed DASH (Dietary Approaches to Stop Hypertension) DASH diet is lower in sodium than a typical American diet. Cut back on foods that are high in saturated fat, cholesterol, and trans fats. Eat more whole-grain foods, fish, poultry, and nuts Remain active and exercise as tolerated daily.  Monitor BP at home-Call if greater than 130/80.  Check CMP/CBC   Medication management All medications discussed and reviewed in full. All questions and concerns regarding medications addressed.    Current intermittent smoker 50+ year smoking hx; back to smoking Discussed risks associated with tobacco use and advised to  reduce or quit Smoking cessation instruction/counseling given:  counseled patient on the dangers of tobacco use, advised patient to stop smoking, and reviewed strategies to maximize success Start low dose CT scan at age 57  Thickened nails with discoloration of both feet Has failed lamisil x 2; plan to refer to podiatry, but declines at this time  Continue to monitor  Edema/venous insufficiency Stable/chronic, NOT NEW, encourage compression, increased walking, elevate when able, low sodium diet Continue to monitor  Colon cancer screening Colonoscopy- patient declines a colonoscopy even though the risks and benefits were discussed at length. Colon cancer is 3rd most diagnosed cancer and 2nd leading cause of death in both men and women 22 years of age and older. Patient understands the risk of cancer and death with declining the test however they are willing to do cologuard screening instead. They understand that this is not as sensitive or specific as a colonoscopy and they are still recommended to get a colonoscopy.  Due for cologuard - ordered  Abnormal EKG Followed with Cardiology Lead misplacement Continue to monitor  Vitamin D deficiency Continue supplement for a goal of 60-100 Monitor levels   Screening for blood or protein in urine Check and monitor Urinalysis/Microalbumin Routine w reflex microscopic  Screening for colon cancer Cologuard due - ordered  Orders Placed This Encounter  Procedures   CBC with Differential/Platelet   COMPLETE METABOLIC PANEL WITH GFR   Magnesium   Lipid panel   TSH   Hemoglobin A1c   Insulin, random   VITAMIN D 25 Hydroxy (Vit-D Deficiency, Fractures)   Urinalysis, Routine w reflex microscopic   Microalbumin / creatinine urine ratio   Cologuard   EKG 12-Lead   Notify office for further evaluation and treatment, questions or concerns if any reported s/s fail to improve.   The patient was advised to call back or seek an in-person  evaluation if any symptoms worsen  or if the condition fails to improve as anticipated.   Further disposition pending results of labs. Discussed med's effects and SE's.    I discussed the assessment and treatment plan with the patient. The patient was provided an opportunity to ask questions and all were answered. The patient agreed with the plan and demonstrated an understanding of the instructions.  Discussed med's effects and SE's. Screening labs and tests as requested with regular follow-up as recommended.  I provided 35 minutes of face-to-face time during this encounter including counseling, chart review, and critical decision making was preformed.  Today's Plan of Care is based on a patient-centered health care approach known as shared decision making - the decisions, tests and treatments allow for patient preferences and values to be balanced with clinical evidence.    Future Appointments  Date Time Provider [place]  07/23/2022  2:30 PM Darrol Jump, NP [facility] None  04/11/2023  2:00 PM Darrol Jump, NP [facility] None    HPI  57 y.o. female  presents for CPE. She has Type 2 diabetes mellitus with hyperglycemia, without long-term current use of insulin ([place]); Hypertension; Hyperlipidemia associated with type 2 diabetes mellitus ([place]); Obesity (BMI 30.0-34.9); Major depression in partial remission ([place]); Discoloration and thickening of nails both feet; Chronic venous insufficiency; Vitamin D deficiency; CKD stage 2 due to type 2 diabetes mellitus ([place]); Diabetic peripheral neuropathy associated with type 2 diabetes mellitus ([place]); TMJ (temporomandibular joint disorder); Former smoker (quit 05/14/2021, 50 pack year hx); Diabetic retinopathy ([place]); Recurrent low back pain episodes; Financial difficulty; At risk for medication nonadherence; and Pulmonary nodules on their problem list.   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 She is working 2-3 jobs.  Does not  have much time for herself.  Takes care of husband who is on disability and dialysis.   She does have hx of recurrent major depression; taking trazodone 50 mg with good results, working with therapist re her husband's situation.   She does have some intermittent mid back pain, takes tizanidine 4 mg at night PRN, also takes rare ibuprofen. Has seen Dr. Louanne Skye for intermittent R shoulder pain, likely impingement, would like referral to see different provider today. Pain limits at job significantly. Would like to do PT but does not have the time.   She is an intermittent smoker with 50+ pack year history, recently quit again on 10/10/2020 but restarted, 1-1.5 pack currently. She had normal CXR 2019. Denies current respiratory sx. Will plan CT at age 79.   BMI is Body mass index is 32.59 kg/m., she has been working on diet, exercise is limited.  Wt Readings from Last 3 Encounters:  04/11/22 211 lb 3.2 oz (95.8 kg)  12/14/21 212 lb (96.2 kg)  10/05/21 221 lb 9.6 oz (100.5 kg)   She has not been checking BPs at home, reports was been normal at dentist, Today their BP is BP: (!) 140/70  She does not workout. She denies chest pain, shortness of breath, dizziness in the last few months, but notes back in Nov 2022 had about 1 week of chest tightness/radiating around her back x 1 week. She had leads misplaced during EKG work up that showed possible MI, however, had follow up with Cardiology and work up was negative.   She is on cholesterol medication (didn't tolerate atorvastatin, pravastatin, had stiffness and joint achiness, taking rosuvastatin 40 mg daily and tolerating, was off last check). Her cholesterol is not at goal. The cholesterol last visit was:  Lab Results  Component Value Date   CHOL 128 10/05/2021   HDL 63 10/05/2021   LDLCALC 44 10/05/2021   TRIG 129 10/05/2021   CHOLHDL 2.0 10/05/2021   She has been working on diet and exercise for T2DM (on metformin ER 1000 mg BID, ozempic 1mg  - was  tolerating but ran out would like to switch to [facility]), she is not on bASA, she is not on ACE/ARB, she is on statin and denies foot ulcerations, increased appetite, nausea, polydipsia, polyuria, visual disturbances, vomiting, and weight loss.  She does have mild tingling burning in bil toes. Hard to follow a strict diet due to being too busy with too many jobs and taking care of husband. Last A1C in the office was:  Lab Results  Component Value Date   HGBA1C 13.3 (H) 10/05/2021   Last GFR: Lab Results  Component Value Date   GFRNONAA 80 05/25/2020   Patient is not currently on Vitamin D supplement, unsure of dose. Will check levels today  Lab Results  Component Value Date   VD25OH 49 02/23/2021       Current Medications:  Current Outpatient Medications on File Prior to Visit  Medication Sig Dispense Refill   albuterol (VENTOLIN HFA) 108 (90 Base) MCG/ACT inhaler Inhale 2 puffs into the lungs every 6 (six) hours as needed for wheezing or shortness of breath. 8 g 2   aspirin EC 81 MG tablet Take 81 mg by mouth daily. Swallow whole.     Cholecalciferol (VITAMIN D3) 250 MCG (10000 UT) TABS Take one tablet daily 30 tablet 0   Continuous Blood Gluc Sensor (FREESTYLE LIBRE 3 SENSOR) MISC PLACE 1 SENSOR ONTO SKIN EVERY 14 DAYS 2 each 0   ezetimibe (ZETIA) 10 MG tablet Take 1 tab daily for cholesterol and heart attack risk reduction. 90 tablet 3   glipiZIDE (GLUCOTROL) 5 MG tablet TAKE 1/2 TO 1 (ONE-HALF TO ONE) TABLET BY MOUTH 2 TO 3 TIMES DAILY WITH MEALS FOR  DIABETES 90 tablet 0   glucose blood (ONETOUCH VERIO) test strip Check fasting sugar daily (prior to breakfast). 100 each 12   losartan (COZAAR) 25 MG tablet Take 1 tab daily for blood pressure and kidney protection. 90 tablet 3   Lysine (GNP L-LYSINE) 600 MG TABS Take one tablet daily     Magnesium 500 MG TABS Take one tablet daily 30 tablet    metFORMIN (GLUCOPHAGE-XR) 500 MG 24 hr tablet Takes 2 tablets in the evening 360 tablet 1    rosuvastatin (CRESTOR) 40 MG tablet Takes 1 tablet everyday 90 tablet 3   tiZANidine (ZANAFLEX) 4 MG capsule TAKE 1 CAPSULE BY MOUTH 3 TIMES DAILY AS NEEDED FOR MUSCLE SPASMS. 270 capsule 1   traZODone (DESYREL) 150 MG tablet Take 1/3-1 tablet daily for mood and sleep. 90 tablet 3   Zinc 50 MG TABS Take by mouth.     Semaglutide,0.25 or 0.5MG /DOS, 2 MG/3ML SOPN Inject 0.5 mg into the skin once a week. (Patient not taking: Reported on 12/14/2021) 3 mL 0   Varenicline Tartrate, Starter, (CHANTIX STARTING MONTH PAK) 0.5 MG X 11 & 1 MG X 42 TBPK As directed- 0.5 mg for 11 days then 1 mg for 42 days (Patient not taking: Reported on 04/11/2022) 53 each 0   No current facility-administered medications on file prior to visit.   Allergies:  Allergies  Allergen Reactions   Atorvastatin     Myalgias   Buspirone     "felt like I  was going to pass out"    Pravastatin    Medical History:  She has Type 2 diabetes mellitus with hyperglycemia, without long-term current use of insulin ([place]); Hypertension; Hyperlipidemia associated with type 2 diabetes mellitus ([place]); Obesity (BMI 30.0-34.9); Major depression in partial remission ([place]); Discoloration and thickening of nails both feet; Chronic venous insufficiency; Vitamin D deficiency; CKD stage 2 due to type 2 diabetes mellitus ([place]); Diabetic peripheral neuropathy associated with type 2 diabetes mellitus ([place]); TMJ (temporomandibular joint disorder); Former smoker (quit 05/14/2021, 50 pack year hx); Diabetic retinopathy ([place]); Recurrent low back pain episodes; Financial difficulty; At risk for medication nonadherence; and Pulmonary nodules on their problem list.   Health Maintenance:   Immunization History  Administered Date(s) Administered   Influenza,inj,Quad PF,6+ Mos 12/28/2016   PNEUMOCOCCAL CONJUGATE-20 02/23/2021   Tdap 10/26/2016   Health Maintenance  Topic Date Due   COVID-19 Vaccine (1) Never done   Zoster Vaccines- Shingrix (1 of 2) Never  done   Fecal DNA (Cologuard)  Never done   OPHTHALMOLOGY EXAM  03/10/2021   INFLUENZA VACCINE  08/15/2021   Diabetic kidney evaluation - Urine ACR  02/23/2022   FOOT EXAM  02/23/2022   Lung Cancer Screening  03/29/2022   HEMOGLOBIN A1C  04/05/2022   PAP SMEAR-Modifier  07/01/2022   Diabetic kidney evaluation - eGFR measurement  10/06/2022   MAMMOGRAM  02/21/2023   DTaP/Tdap/Td (2 - Td or Tdap) 10/27/2026   Hepatitis C Screening  Completed   HIV Screening  Completed   HPV VACCINES  Aged Out   Tetanus: 2018 Prevnar 20: 02/2021 Flu vaccine: will get at pharmacy  Shingrix: check insurance coverage  Covid 19: declines   LMP: No LMP recorded. Patient is postmenopausal. Pap: Has GYN - Dr. Charlotte Crumb - [facility], annually  MGM:  by Dr. Edwyna Ready, 02/2022 DEXA: - N/A  Colonoscopy: DUE - cologuard was ordered but never completed - requests to send again  EGD: n/a  Last Dental Exam: [facility], last exam 2023  Last Eye Exam: Last 03/10/2020 - retinopathy   Patient Care Team: Unk Pinto, MD as PCP - General (Internal Medicine) Jettie Booze, MD as PCP - Cardiology (Cardiology) Jessy Oto, MD (Inactive) as Consulting Physician (Orthopedic Surgery)  Surgical History:  She has a past surgical history that includes Cholecystectomy (2005) and Cesarean section (2000). Family History:  Herfamily history includes Renal Disease in her son. She was adopted. Social History:  She reports that she has been smoking cigarettes. She started smoking about 41 years ago. She has a 50.00 pack-year smoking history. She has never used smokeless tobacco. She reports current alcohol use. She reports that she does not use drugs.   Review of Systems: Review of Systems  Constitutional:  Negative for malaise/fatigue and weight loss.  HENT:  Negative for hearing loss and tinnitus.   Eyes:  Negative for blurred vision and double vision.  Respiratory:  Negative for cough, shortness of breath and  wheezing.   Cardiovascular:  Negative for chest pain, palpitations, orthopnea, claudication and leg swelling (chronic, mild, bil).  Gastrointestinal:  Negative for abdominal pain, blood in stool, constipation, diarrhea, heartburn, melena, nausea and vomiting.  Genitourinary: Negative.   Musculoskeletal:  Positive for back pain (lumbar, ortho following) and joint pain (L shoulder). Negative for myalgias.  Skin:  Negative for rash.  Neurological:  Positive for tingling (bil feet). Negative for dizziness, sensory change, weakness and headaches.  Endo/Heme/Allergies:  Negative for polydipsia.  Psychiatric/Behavioral:  Negative for  depression, hallucinations, substance abuse and suicidal ideas. The patient is not nervous/anxious and does not have insomnia.   All other systems reviewed and are negative.   Physical Exam: Estimated body mass index is 32.59 kg/m as calculated from the following:   Height as of this encounter: 5' 7.5" (1.715 m).   Weight as of this encounter: 211 lb 3.2 oz (95.8 kg). BP (!) 140/70   Pulse 76   Temp 97.6 F (36.4 C)   Ht 5' 7.5" (1.715 m)   Wt 211 lb 3.2 oz (95.8 kg)   SpO2 98%   BMI 32.59 kg/m  General Appearance: Well nourished, appears older than stated age, in no apparent distress.  Eyes: PERRLA, EOMs, conjunctiva no swelling or erythema Sinuses: No Frontal/maxillary tenderness  ENT/Mouth: Ext aud canals clear, normal light reflex with TMs without erythema, bulging. Good dentition. No erythema, swelling, or exudate on post pharynx. Tonsils not swollen or erythematous. Hearing normal. Missing extensive teeth.  Neck: Supple, thyroid normal. No bruits  Respiratory: Respiratory effort normal, BS equal bilaterally without rales, rhonchi, wheezing or stridor.  Cardio: RRR without murmurs, rubs or gallops. Brisk peripheral pulses with 1+ bilateral non-pitting edema to ankles.  Chest: symmetric, with normal excursions and percussion.  Breasts: Defer to  GYN Abdomen: Soft, nontender, no guarding, rebound, hernias, masses, or organomegaly.  Lymphatics: Non tender without lymphadenopathy.  Genitourinary: Defer to GYN Musculoskeletal: Full ROM bil lower extremities, 5/5 strength, and normal gait. L shoulder with limited abduction and external rotation with pain.  Skin: Warm, dry without rashes, lesions, ecchymosis. Bilateral toe nails yellowed, brittle distally.  Neuro: Cranial nerves intact, reflexes equal bilaterally. Normal muscle tone, no cerebellar symptoms. Sensation intact to monofilament bilaterally.  Psych: Awake and oriented X 3, normal affect, Insight and Judgment appropriate.   EKG: NSR  Rayven Hendrickson 9:47 PM [facility]

## 2022-04-11 NOTE — Patient Instructions (Signed)
Tirzepatide Injection What is this medication? TIRZEPATIDE (tir ZEP a tide) treats type 2 diabetes. It works by increasing insulin levels in your body, which decreases your blood sugar (glucose). Changes to diet and exercise are often combined with this medication. This medicine may be used for other purposes; ask your health care provider or pharmacist if you have questions. COMMON BRAND NAME(S): MOUNJARO What should I tell my care team before I take this medication? They need to know if you have any of these conditions: Endocrine tumors (MEN 2) or if someone in your family had these tumors Eye disease, vision problems Gallbladder disease History of pancreatitis Kidney disease Stomach or intestine problems Thyroid cancer or if someone in your family had thyroid cancer An unusual or allergic reaction to tirzepatide, other medications, foods, dyes, or preservatives Pregnant or trying to get pregnant Breast-feeding How should I use this medication? This medication is injected under the skin. You will be taught how to prepare and give it. It is given once every week (every 7 days). Keep taking it unless your health care provider tells you to stop. If you use this medication with insulin, you should inject this medication and the insulin separately. Do not mix them together. Do not give the injections right next to each other. Change (rotate) injection sites with each injection. This medication comes with INSTRUCTIONS FOR USE. Ask your pharmacist for directions on how to use this medication. Read the information carefully. Talk to your pharmacist or care team if you have questions. It is important that you put your used needles and syringes in a special sharps container. Do not put them in a trash can. If you do not have a sharps container, call your pharmacist or care team to get one. A special MedGuide will be given to you by the pharmacist with each prescription and refill. Be sure to read this  information carefully each time. Talk to your care team about the use of this medication in children. Special care may be needed. Overdosage: If you think you have taken too much of this medicine contact a poison control center or emergency room at once. NOTE: This medicine is only for you. Do not share this medicine with others. What if I miss a dose? If you miss a dose, take it as soon as you can unless it is more than 4 days (96 hours) late. If it is more than 4 days late, skip the missed dose. Take the next dose at the normal time. Do not take 2 doses within 3 days of each other. What may interact with this medication? Alcohol Antiviral medications for HIV or AIDS Aspirin and aspirin-like medications Beta blockers, such as atenolol, metoprolol, propranolol Certain medications for blood pressure, heart disease, irregular heart beat Chromium Clonidine Diuretics Estrogen or progestin hormones Fenofibrate Gemfibrozil Guanethidine Isoniazid Lanreotide Female hormones or anabolic steroids MAOIs, such as Marplan, Nardil, and Parnate Medications for weight loss Medications for allergies, asthma, cold, or cough Medications for depression, anxiety, or mental health conditions Niacin Nicotine NSAIDs, medications for pain and inflammation, such as ibuprofen or naproxen Octreotide Other medications for diabetes, such as glyburide, glipizide, or glimepiride Pasireotide Pentamidine Phenytoin Probenecid Quinolone antibiotics, such as ciprofloxacin, levofloxacin, ofloxacin Reserpine Some herbal dietary supplements Steroid medications, such as prednisone or cortisone Sulfamethoxazole; trimethoprim Thyroid hormones Warfarin This list may not describe all possible interactions. Give your health care provider a list of all the medicines, herbs, non-prescription drugs, or dietary supplements you use. Also tell them   if you smoke, drink alcohol, or use illegal drugs. Some items may interact with  your medicine. What should I watch for while using this medication? Visit your care team for regular checks on your progress. Drink plenty of fluids while taking this medication. Check with your care team if you get an attack of severe diarrhea, nausea, and vomiting. The loss of too much body fluid can make it dangerous for you to take this medication. A test called the HbA1C (A1C) will be monitored. This is a simple blood test. It measures your blood sugar control over the last 2 to 3 months. You will receive this test every 3 to 6 months. Learn how to check your blood sugar. Learn the symptoms of low and high blood sugar and how to manage them. Always carry a quick-source of sugar with you in case you have symptoms of low blood sugar. Examples include hard sugar candy or glucose tablets. Make sure others know that you can choke if you eat or drink when you develop serious symptoms of low blood sugar, such as seizures or unconsciousness. They must get medical help at once. Tell your care team if you have high blood sugar. You might need to change the dose of your medication. If you are sick or exercising more than usual, you might need to change the dose of your medication. Do not skip meals. Ask your care team if you should avoid alcohol. Many nonprescription cough and cold products contain sugar or alcohol. These can affect blood sugar. Pens should never be shared. Even if the needle is changed, sharing may result in passing of viruses like hepatitis or HIV. Wear a medical ID bracelet or chain, and carry a card that describes your disease and details of your medication and dosage times. Birth control may not work properly while you are taking this medication. If you take birth control pills by mouth, your care team may recommend another type of birth control for 4 weeks after you start this medication and for 4 weeks after each increase in your dose of this medication. Ask your care team which birth  control methods you should use. What side effects may I notice from receiving this medication? Side effects that you should report to your care team as soon as possible: Allergic reactions or angioedema--skin rash, itching or hives, swelling of the face, eyes, lips, tongue, arms, or legs, trouble swallowing or breathing Change in vision Dehydration--increased thirst, dry mouth, feeling faint or lightheaded, headache, dark yellow or brown urine Gallbladder problems--severe stomach pain, nausea, vomiting, fever Kidney injury--decrease in the amount of urine, swelling of the ankles, hands, or feet Pancreatitis--severe stomach pain that spreads to your back or gets worse after eating or when touched, fever, nausea, vomiting Thyroid cancer--new mass or lump in the neck, pain or trouble swallowing, trouble breathing, hoarseness Side effects that usually do not require medical attention (report these to your care team if they continue or are bothersome): Constipation Diarrhea Loss of appetite Nausea Stomach pain Upset stomach Vomiting This list may not describe all possible side effects. Call your doctor for medical advice about side effects. You may report side effects to FDA at 1-800-FDA-1088. Where should I keep my medication? Keep out of the reach of children and pets. Refrigeration (preferred): Store in the refrigerator. Keep this medication in the original carton until you are ready to take it. Do not freeze. Protect from light. Get rid of opened vials after use, even if there is medication   left. Get rid of any unopened vials or pens after the expiration date. Room Temperature: This medication may be stored at room temperature below 30 degrees C (86 degrees F) for up to 21 days. Keep this medication in the original carton until you are ready to take it. Protect from light. Avoid exposure to extreme heat. Get rid of opened vials after use, even if there is medication left. Get rid of any unopened  vials or pens after 21 days, or after they expire, whichever is first. To get rid of medications that are no longer needed or have expired: Take the medication to a medication take-back program. Check with your pharmacy or law enforcement to find a location. If you cannot return the medication, ask your pharmacist or care team how to get rid of this medication safely. NOTE: This sheet is a summary. It may not cover all possible information. If you have questions about this medicine, talk to your doctor, pharmacist, or health care provider.  2023 Elsevier/Gold Standard (2021-11-16 00:00:00)  

## 2022-04-12 LAB — LIPID PANEL
Cholesterol: 183 mg/dL (ref <200)
HDL: 64 mg/dL (ref >=50)
LDL Cholesterol (Calc): 94 mg/dL (calc)
Non-HDL Cholesterol (Calc): 119 mg/dL (calc) (ref <130)
Total CHOL/HDL Ratio: 2.9 (calc) (ref <5.0)
Triglycerides: 148 mg/dL (ref <150)

## 2022-04-12 LAB — COMPLETE METABOLIC PANEL WITH GFR
AG Ratio: 1.5 (calc) (ref 1.0–2.5)
ALT: 14 U/L (ref 6–29)
AST: 16 U/L (ref 10–35)
Albumin: 4.2 g/dL (ref 3.6–5.1)
Alkaline phosphatase (APISO): 84 U/L (ref 37–153)
BUN: 23 mg/dL (ref 7–25)
CO2: 29 mmol/L (ref 20–32)
Calcium: 9.8 mg/dL (ref 8.6–10.4)
Chloride: 104 mmol/L (ref 98–110)
Creat: 0.83 mg/dL (ref 0.50–1.03)
Globulin: 2.8 g/dL (calc) (ref 1.9–3.7)
Glucose, Bld: 130 mg/dL — ABNORMAL HIGH (ref 65–99)
Potassium: 4.7 mmol/L (ref 3.5–5.3)
Sodium: 141 mmol/L (ref 135–146)
Total Bilirubin: 0.3 mg/dL (ref 0.2–1.2)
Total Protein: 7 g/dL (ref 6.1–8.1)
eGFR: 84 mL/min/{1.73_m2} (ref >=60)

## 2022-04-12 LAB — URINALYSIS, ROUTINE W REFLEX MICROSCOPIC
Bilirubin Urine: NEGATIVE
Hgb urine dipstick: NEGATIVE
Hyaline Cast: NONE SEEN /LPF
Ketones, ur: NEGATIVE
Leukocytes,Ua: NEGATIVE
Nitrite: NEGATIVE
RBC / HPF: NONE SEEN /HPF (ref 0–2)
Specific Gravity, Urine: 1.026 (ref 1.001–1.035)
pH: 5.5 (ref 5.0–8.0)

## 2022-04-12 LAB — CBC WITH DIFFERENTIAL/PLATELET
Absolute Monocytes: 724 cells/uL (ref 200–950)
Basophils Absolute: 108 cells/uL (ref 0–200)
Basophils Relative: 1 %
Eosinophils Absolute: 378 cells/uL (ref 15–500)
Eosinophils Relative: 3.5 %
HCT: 42 % (ref 35.0–45.0)
Hemoglobin: 13.9 g/dL (ref 11.7–15.5)
Lymphs Abs: 3380 cells/uL (ref 850–3900)
MCH: 28.6 pg (ref 27.0–33.0)
MCHC: 33.1 g/dL (ref 32.0–36.0)
MCV: 86.4 fL (ref 80.0–100.0)
MPV: 10.5 fL (ref 7.5–12.5)
Monocytes Relative: 6.7 %
Neutro Abs: 6210 cells/uL (ref 1500–7800)
Neutrophils Relative %: 57.5 %
Platelets: 305 10*3/uL (ref 140–400)
RBC: 4.86 10*6/uL (ref 3.80–5.10)
RDW: 11.9 % (ref 11.0–15.0)
Total Lymphocyte: 31.3 %
WBC: 10.8 10*3/uL (ref 3.8–10.8)

## 2022-04-12 LAB — MICROALBUMIN / CREATININE URINE RATIO
Creatinine, Urine: 150 mg/dL (ref 20–275)
Microalb Creat Ratio: 64 mg/g creat — ABNORMAL HIGH (ref <30)
Microalb, Ur: 9.6 mg/dL

## 2022-04-12 LAB — HEMOGLOBIN A1C
Hgb A1c MFr Bld: 11.7 % of total Hgb — ABNORMAL HIGH (ref <5.7)
Mean Plasma Glucose: 289 mg/dL
eAG (mmol/L): 16 mmol/L

## 2022-04-12 LAB — TSH: TSH: 1.2 mIU/L

## 2022-04-12 LAB — INSULIN, RANDOM: Insulin: 20.1 u[IU]/mL — ABNORMAL HIGH

## 2022-04-12 LAB — VITAMIN D 25 HYDROXY (VIT D DEFICIENCY, FRACTURES): Vit D, 25-Hydroxy: 54 ng/mL (ref 30–100)

## 2022-04-12 LAB — MAGNESIUM: Magnesium: 1.8 mg/dL (ref 1.5–2.5)

## 2022-04-12 LAB — MICROSCOPIC MESSAGE

## 2022-04-14 ENCOUNTER — Other Ambulatory Visit: Payer: Self-pay | Admitting: Nurse Practitioner

## 2022-04-14 DIAGNOSIS — E1165 Type 2 diabetes mellitus with hyperglycemia: Secondary | ICD-10-CM

## 2022-04-16 MED ORDER — FREESTYLE LIBRE 3 SENSOR MISC: 0 refills | Status: DC

## 2022-04-17 ENCOUNTER — Telehealth: Payer: Self-pay | Admitting: Nurse Practitioner

## 2022-04-17 DIAGNOSIS — E1165 Type 2 diabetes mellitus with hyperglycemia: Secondary | ICD-10-CM

## 2022-04-17 MED ORDER — FREESTYLE LIBRE 3 SENSOR MISC: 0 refills | Status: DC

## 2022-04-17 NOTE — Telephone Encounter (Signed)
Can you switch the pharmacy on the continous blood glucometer and testing supplies that were sent in yesterday to [facility]?

## 2022-04-17 NOTE — Addendum Note (Signed)
Addended by: Chancy Hurter on: 04/17/2022 11:50 AM   Modules accepted: Orders

## 2022-04-18 NOTE — Telephone Encounter (Signed)
Pt got a call back from pharmacy that it requires a PA

## 2022-04-24 IMAGING — CT CT HEART MORP W/ CTA COR W/ SCORE W/ CA W/CM &/OR W/O CM
4 of 7 series · 8 of 20 positions shown, 9 images · IV contrast (APPLIED)
Comparison: None.

Addendum:
CLINICAL DATA: Chest pain

EXAM:
Cardiac/Coronary CTA
TECHNIQUE: A non-contrast, gated CT scan was obtained with axial slices of 3 mm
through the heart for calcium scoring. Calcium scoring was performed
using the Agatston method. A 120 kV prospective, gated, contrast
cardiac scan was obtained. Gantry rotation speed was 250 msecs and
collimation was 0.6 mm. Two sublingual nitroglycerin tablets (0.8
mg) were given. The 3D data set was reconstructed in 5% intervals of
the 35-75% of the R-R cycle. Diastolic phases were analyzed on a
dedicated workstation using MPR, MIP, and VRT modes. The patient
received 95 cc of contrast.

[Series 6: ts diast sharp · axial · 0.39mm/px · z∈[+7,+44]mm · 2 of 273 slices shown]
[im 91/273  lung]
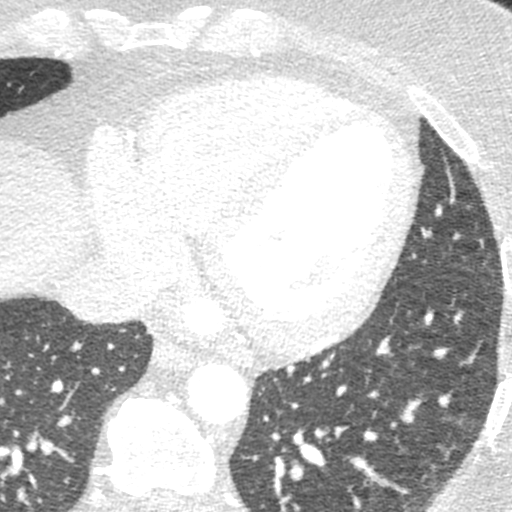
[im 182/273  lung]
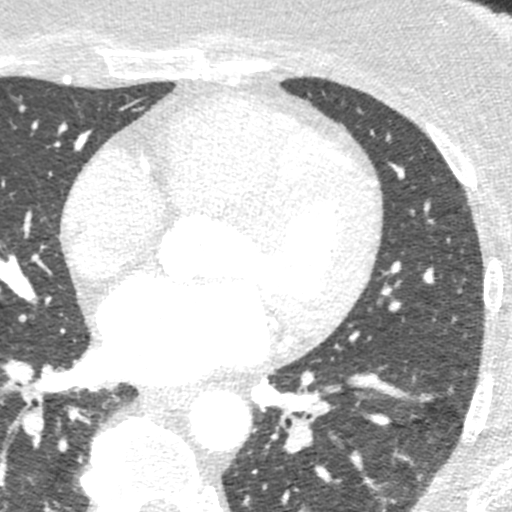

[Series 7: best diast · axial · 0.39mm/px · z∈[+7,+44]mm · 2 of 273 slices shown, 3 images]
[im 91/273  vessel]
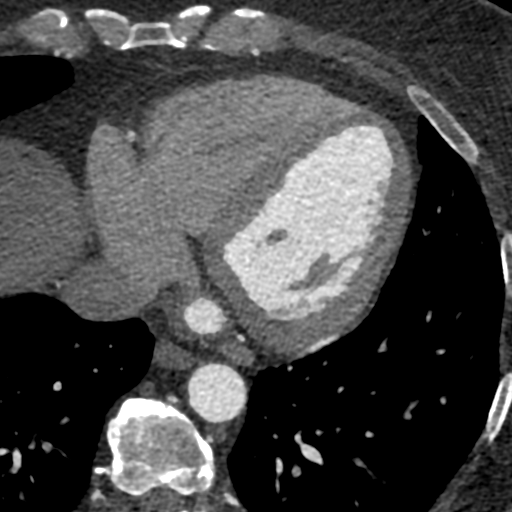
[im 91/273  lung]
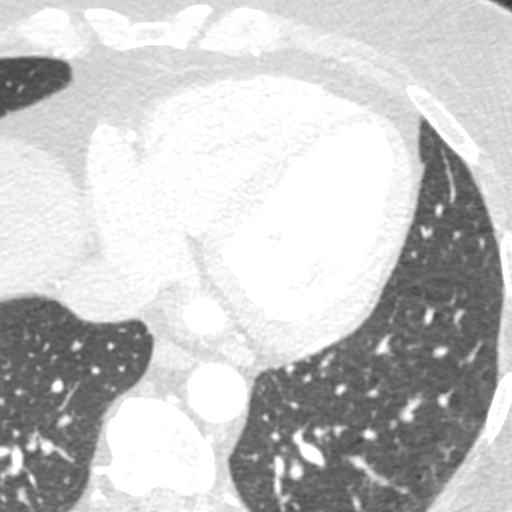
[im 182/273  vessel]
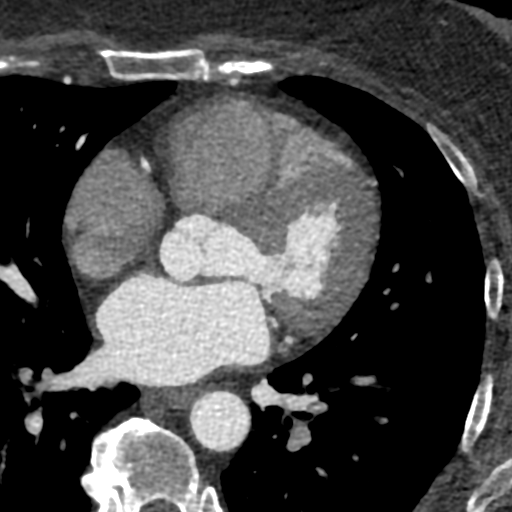

[Series 8: ts syst sharp · axial · 0.39mm/px · z∈[+7,+44]mm · 2 of 273 slices shown]
[im 91/273  lung]
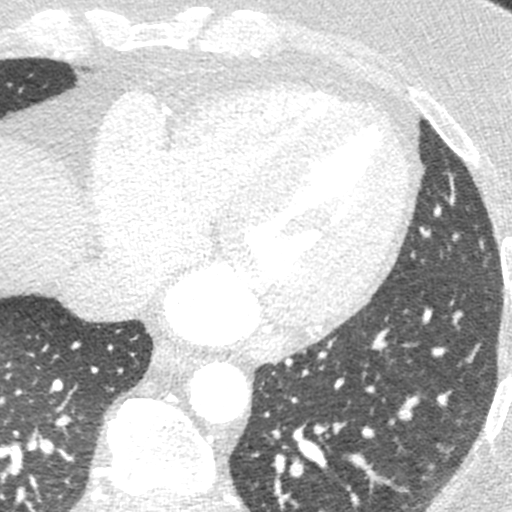
[im 182/273  lung]
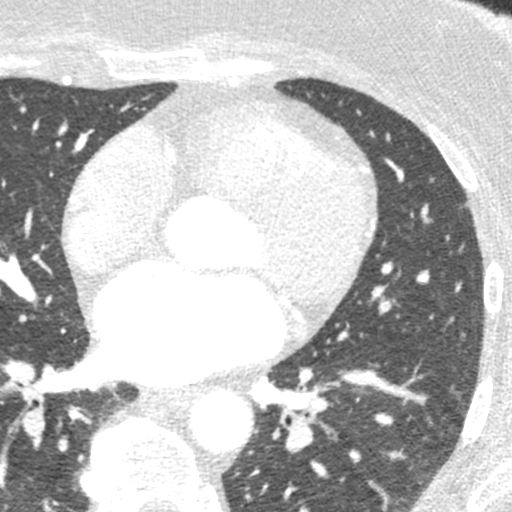

[Series 9: best syst · axial · 0.39mm/px · z∈[+7,+44]mm · 2 of 273 slices shown]
[im 91/273  vessel]
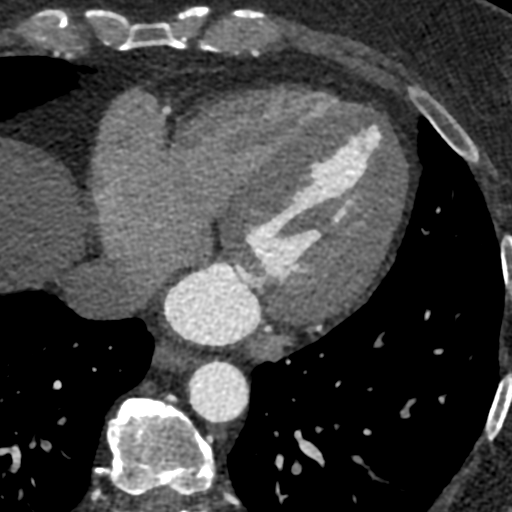
[im 182/273  vessel]
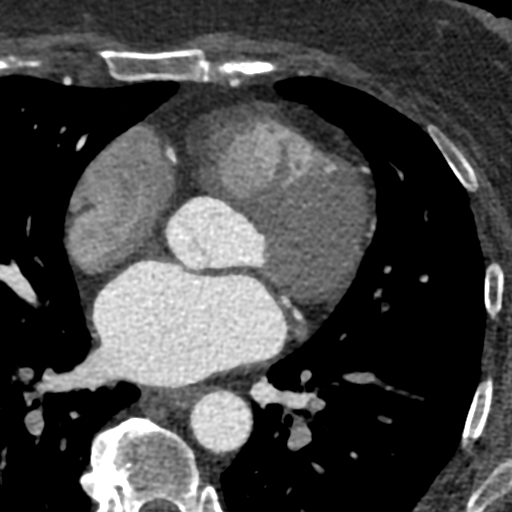

[8 of 20 positions shown; findings below may reference images not displayed]

FINDINGS: Image quality: Excellent.

Noise artifact is: Limited.

Coronary Arteries:  Normal coronary origin.  Right dominance.

Left main: The left main is a large caliber vessel with a normal
take off from the left coronary cusp that trifurcates to form a left
anterior descending artery, Ramus and a left circumflex artery.
There is no plaque or stenosis.

Left anterior descending artery: The LAD is minimal calcified plaque
in the proximal LAD with associated stenosis of < 25%. The LAD gives
off 1 patent diagonal branch.

Ramus is a very small caliber sized artery with no plaque or
stenosis in the proximal portion.

Left circumflex artery: The LCX is non-dominant and patent with no
evidence of plaque or stenosis. The LCX gives off 1 patent obtuse
marginal branch. There is minimal calcified plaque in the ostium of
the LCx and OM1 with associated stenosis of < 25%.

Right coronary artery: The RCA is dominant with normal take off from
the right coronary cusp. There is minimal calcified plaque in the
proximal RCA with associated stenosis of < 25%. The RCA terminates
as a PDA and right posterolateral branch without evidence of plaque
or stenosis.

Right Atrium: Right atrial size is within normal limits.

Right Ventricle: The right ventricular cavity is within normal
limits.

Left Atrium: Left atrial size is normal in size with no left atrial
appendage filling defect.

Left Ventricle: The ventricular cavity size is within normal limits.
There are no stigmata of prior infarction. There is no abnormal
filling defect.

Pulmonary arteries: Normal in size without proximal filling defect.

Pulmonary veins: Normal pulmonary venous drainage.

Pericardium: Normal thickness with no significant effusion or
calcium present.

Cardiac valves: The aortic valve is trileaflet without significant
calcification. The mitral valve is normal structure without
significant calcification.

Aorta: Normal caliber with no significant disease.

Extra-cardiac findings: See attached radiology report for
non-cardiac structures.
IMPRESSION: 1. Coronary calcium score of 9.99. This was 85th percentile for
age-, sex, and race-matched controls.

2.  Normal coronary origin with right dominance.

3.  Minimal atherosclerosis.  CAD RADS 1.

4.  Recommend preventive therapy and risk factor modification.

5.  Consider non atherosclerotic causes of chest pain.

RECOMMENDATIONS:
1. CAD-RADS 0: No evidence of CAD (0%). Consider non-atherosclerotic
causes of chest pain.

2. CAD-RADS 1: Minimal non-obstructive CAD (0-24%). Consider
non-atherosclerotic causes of chest pain. Consider preventive
therapy and risk factor modification.

3. CAD-RADS 2: Mild non-obstructive CAD (25-49%). Consider
non-atherosclerotic causes of chest pain. Consider preventive
therapy and risk factor modification.

4. CAD-RADS 3: Moderate stenosis. Consider symptom-guided
anti-ischemic pharmacotherapy as well as risk factor modification
per guideline directed care. Additional analysis with CT FFR will be
submitted.

5. CAD-RADS 4: Severe stenosis. (70-99% or > 50% left main). Cardiac
catheterization or CT FFR is recommended. Consider symptom-guided
anti-ischemic pharmacotherapy as well as risk factor modification
per guideline directed care. Invasive coronary angiography
recommended with revascularization per published guideline
statements.

6. CAD-RADS 5: Total coronary occlusion (100%). Consider cardiac
catheterization or viability assessment. Consider symptom-guided
anti-ischemic pharmacotherapy as well as risk factor modification
per guideline directed care.

7. CAD-RADS N: Non-diagnostic study. Obstructive CAD can't be
excluded. Alternative evaluation is recommended.

EXAM:
OVER-READ INTERPRETATION  CT CHEST

The following report is an over-read performed by radiologist Dr.
does not include interpretation of cardiac or coronary anatomy or
pathology. The coronary CTA interpretation by the cardiologist is
attached.
FINDINGS: Visualized mediastinal structures are normal. Calcifications in the
spleen. No acute abnormality in the visualized upper abdomen. Normal
caliber of the visualized thoracic aorta. 3 mm pleural-based nodule
in the right middle lobe on sequence 11, image 14. No significant
airspace disease or consolidation in the visualized lungs. No
pleural effusions. 2 mm peripheral nodular density in the right
lower lobe on image 34. No acute bone abnormality.
IMPRESSION: 1. No acute extracardiac findings.
2. Small pulmonary nodules, largest measuring 3 mm. These are likely
incidental findings but indeterminate. No follow-up needed if
patient is low-risk.This recommendation follows the consensus
statement: Guidelines for Management of Incidental Pulmonary Nodules
Detected on CT Images: From the [HOSPITAL] 2689; Radiology
2689; [DATE].

*** End of Addendum ***
FINDINGS: Image quality: Excellent.

Noise artifact is: Limited.

Coronary Arteries:  Normal coronary origin.  Right dominance.

Left main: The left main is a large caliber vessel with a normal
take off from the left coronary cusp that trifurcates to form a left
anterior descending artery, Ramus and a left circumflex artery.
There is no plaque or stenosis.

Left anterior descending artery: The LAD is minimal calcified plaque
in the proximal LAD with associated stenosis of < 25%. The LAD gives
off 1 patent diagonal branch.

Ramus is a very small caliber sized artery with no plaque or
stenosis in the proximal portion.

Left circumflex artery: The LCX is non-dominant and patent with no
evidence of plaque or stenosis. The LCX gives off 1 patent obtuse
marginal branch. There is minimal calcified plaque in the ostium of
the LCx and OM1 with associated stenosis of < 25%.

Right coronary artery: The RCA is dominant with normal take off from
the right coronary cusp. There is minimal calcified plaque in the
proximal RCA with associated stenosis of < 25%. The RCA terminates
as a PDA and right posterolateral branch without evidence of plaque
or stenosis.

Right Atrium: Right atrial size is within normal limits.

Right Ventricle: The right ventricular cavity is within normal
limits.

Left Atrium: Left atrial size is normal in size with no left atrial
appendage filling defect.

Left Ventricle: The ventricular cavity size is within normal limits.
There are no stigmata of prior infarction. There is no abnormal
filling defect.

Pulmonary arteries: Normal in size without proximal filling defect.

Pulmonary veins: Normal pulmonary venous drainage.

Pericardium: Normal thickness with no significant effusion or
calcium present.

Cardiac valves: The aortic valve is trileaflet without significant
calcification. The mitral valve is normal structure without
significant calcification.

Aorta: Normal caliber with no significant disease.

Extra-cardiac findings: See attached radiology report for
non-cardiac structures.
IMPRESSION: 1. Coronary calcium score of 9.99. This was 85th percentile for
age-, sex, and race-matched controls.

2.  Normal coronary origin with right dominance.

3.  Minimal atherosclerosis.  CAD RADS 1.

4.  Recommend preventive therapy and risk factor modification.

5.  Consider non atherosclerotic causes of chest pain.

RECOMMENDATIONS:
1. CAD-RADS 0: No evidence of CAD (0%). Consider non-atherosclerotic
causes of chest pain.

2. CAD-RADS 1: Minimal non-obstructive CAD (0-24%). Consider
non-atherosclerotic causes of chest pain. Consider preventive
therapy and risk factor modification.

3. CAD-RADS 2: Mild non-obstructive CAD (25-49%). Consider
non-atherosclerotic causes of chest pain. Consider preventive
therapy and risk factor modification.

4. CAD-RADS 3: Moderate stenosis. Consider symptom-guided
anti-ischemic pharmacotherapy as well as risk factor modification
per guideline directed care. Additional analysis with CT FFR will be
submitted.

5. CAD-RADS 4: Severe stenosis. (70-99% or > 50% left main). Cardiac
catheterization or CT FFR is recommended. Consider symptom-guided
anti-ischemic pharmacotherapy as well as risk factor modification
per guideline directed care. Invasive coronary angiography
recommended with revascularization per published guideline
statements.

6. CAD-RADS 5: Total coronary occlusion (100%). Consider cardiac
catheterization or viability assessment. Consider symptom-guided
anti-ischemic pharmacotherapy as well as risk factor modification
per guideline directed care.

7. CAD-RADS N: Non-diagnostic study. Obstructive CAD can't be
excluded. Alternative evaluation is recommended.

## 2022-04-25 ENCOUNTER — Other Ambulatory Visit: Payer: Self-pay

## 2022-04-25 DIAGNOSIS — E1165 Type 2 diabetes mellitus with hyperglycemia: Secondary | ICD-10-CM

## 2022-04-25 MED ORDER — DEXCOM G6 RECEIVER DEVI: 11 refills | Status: DC

## 2022-04-25 MED ORDER — DEXCOM G6 SENSOR MISC: 11 refills | Status: DC

## 2022-04-25 NOTE — Telephone Encounter (Signed)
Sent in a different monitor

## 2022-05-24 ENCOUNTER — Other Ambulatory Visit: Payer: Self-pay | Admitting: Internal Medicine

## 2022-05-24 ENCOUNTER — Other Ambulatory Visit: Payer: Self-pay | Admitting: Nurse Practitioner

## 2022-05-24 DIAGNOSIS — Z79899 Other long term (current) drug therapy: Secondary | ICD-10-CM

## 2022-05-24 DIAGNOSIS — E1165 Type 2 diabetes mellitus with hyperglycemia: Secondary | ICD-10-CM

## 2022-05-24 MED ORDER — TIRZEPATIDE 5 MG/0.5ML ~~LOC~~ SOAJ: SUBCUTANEOUS | 0 refills | Status: DC

## 2022-05-25 ENCOUNTER — Other Ambulatory Visit: Payer: Self-pay | Admitting: Nurse Practitioner

## 2022-05-25 DIAGNOSIS — E1165 Type 2 diabetes mellitus with hyperglycemia: Secondary | ICD-10-CM

## 2022-05-25 DIAGNOSIS — M62830 Muscle spasm of back: Secondary | ICD-10-CM

## 2022-07-03 ENCOUNTER — Other Ambulatory Visit: Payer: Self-pay | Admitting: Internal Medicine

## 2022-07-03 DIAGNOSIS — E1122 Type 2 diabetes mellitus with diabetic chronic kidney disease: Secondary | ICD-10-CM

## 2022-07-03 MED ORDER — TIRZEPATIDE 7.5 MG/0.5ML ~~LOC~~ SOAJ
SUBCUTANEOUS | 0 refills | Status: DC
Start: 2022-07-03 — End: 2022-08-29

## 2022-07-10 ENCOUNTER — Encounter: Payer: Self-pay | Admitting: Nurse Practitioner

## 2022-07-23 ENCOUNTER — Encounter: Payer: Self-pay | Admitting: Nurse Practitioner

## 2022-07-23 ENCOUNTER — Ambulatory Visit (INDEPENDENT_AMBULATORY_CARE_PROVIDER_SITE_OTHER): Payer: BC Managed Care – PPO | Admitting: Nurse Practitioner

## 2022-07-23 VITALS — BP 124/62 | HR 73 | Temp 97.7°F | Ht 67.5 in | Wt 189.4 lb

## 2022-07-23 DIAGNOSIS — F324 Major depressive disorder, single episode, in partial remission: Secondary | ICD-10-CM | POA: Diagnosis not present

## 2022-07-23 DIAGNOSIS — F17200 Nicotine dependence, unspecified, uncomplicated: Secondary | ICD-10-CM

## 2022-07-23 DIAGNOSIS — E559 Vitamin D deficiency, unspecified: Secondary | ICD-10-CM

## 2022-07-23 DIAGNOSIS — E1165 Type 2 diabetes mellitus with hyperglycemia: Secondary | ICD-10-CM | POA: Diagnosis not present

## 2022-07-23 DIAGNOSIS — E1169 Type 2 diabetes mellitus with other specified complication: Secondary | ICD-10-CM | POA: Diagnosis not present

## 2022-07-23 DIAGNOSIS — E785 Hyperlipidemia, unspecified: Secondary | ICD-10-CM

## 2022-07-23 DIAGNOSIS — I10 Essential (primary) hypertension: Secondary | ICD-10-CM

## 2022-07-23 DIAGNOSIS — E669 Obesity, unspecified: Secondary | ICD-10-CM

## 2022-07-23 DIAGNOSIS — Z79899 Other long term (current) drug therapy: Secondary | ICD-10-CM

## 2022-07-23 LAB — CBC WITH DIFFERENTIAL/PLATELET
Absolute Monocytes: 666 cells/uL (ref 200–950)
Basophils Relative: 1.1 %
HCT: 40.2 % (ref 35.0–45.0)
Hemoglobin: 13.3 g/dL (ref 11.7–15.5)
Lymphs Abs: 3303 cells/uL (ref 850–3900)
MCH: 29 pg (ref 27.0–33.0)
MCHC: 33.1 g/dL (ref 32.0–36.0)
MPV: 10.6 fL (ref 7.5–12.5)
Platelets: 335 10*3/uL (ref 140–400)
RBC: 4.58 10*6/uL (ref 3.80–5.10)
WBC: 9.8 10*3/uL (ref 3.8–10.8)

## 2022-07-23 NOTE — Patient Instructions (Signed)

## 2022-07-23 NOTE — Progress Notes (Signed)
Follow Up  Assessment and Plan:  Type 2 diabetes mellitus with hyperglycemia, without long-term current use of insulin (HCC) Continue Mounjaro Continue Glipizide PRN Continue Libre censoring. Education: Reviewed 'ABCs' of diabetes management  Discussed goals to be met and/or maintained include A1C (<7) Blood pressure (<130/80) Cholesterol (LDL <70) Continue Eye Exam yearly  Continue Dental Exam Q6 mo Discussed dietary recommendations Discussed Physical Activity recommendations Check A1C  Hyperlipidemia associated with type 2 diabetes mellitus (HCC) Continue Rosuvastatin Discussed lifestyle modifications. Recommended diet heavy in fruits and veggies, omega 3's. Decrease consumption of animal meats, cheeses, and dairy products. Remain active and exercise as tolerated. Continue to monitor. Check lipids/TSH  Obesity (BMI 30.0-34.9) Trending down Discussed appropriate BMI Diet modification. Physical activity. Encouraged/praised to build confidence.  Major depressive disorder in partial remission, unspecified whether recurrent (HCC) Continue trazodone and therapy  Lifestyle discussed: diet/exerise, sleep hygiene, stress management, hydration  Hypertension Discussed DASH (Dietary Approaches to Stop Hypertension) DASH diet is lower in sodium than a typical American diet. Cut back on foods that are high in saturated fat, cholesterol, and trans fats. Eat more whole-grain foods, fish, poultry, and nuts Remain active and exercise as tolerated daily.  Monitor BP at home-Call if greater than 130/80.  Check CMP/CBC  Medication management All medications discussed and reviewed in full. All questions and concerns regarding medications addressed.    Current intermittent smoker 50+ year smoking hx; back to smoking Discussed risks associated with tobacco use and advised to reduce or quit Smoking cessation instruction/counseling given:  counseled patient on the dangers of tobacco  use, advised patient to stop smoking, and reviewed strategies to maximize success Start low dose CT scan at age 26  Edema/venous insufficiency Stable/chronic, NOT NEW, encourage compression, increased walking, elevate when able, low sodium diet Continue to monitor  Vitamin D deficiency Continue supplement for a goal of 60-100 Monitor levels   Orders Placed This Encounter  Procedures   CBC with Differential/Platelet   COMPLETE METABOLIC PANEL WITH GFR   Lipid panel   Hemoglobin A1C w/out eAG    Notify office for further evaluation and treatment, questions or concerns if any reported s/s fail to improve.   The patient was advised to call back or seek an in-person evaluation if any symptoms worsen or if the condition fails to improve as anticipated.   Further disposition pending results of labs. Discussed med's effects and SE's.    I discussed the assessment and treatment plan with the patient. The patient was provided an opportunity to ask questions and all were answered. The patient agreed with the plan and demonstrated an understanding of the instructions.  Discussed med's effects and SE's. Screening labs and tests as requested with regular follow-up as recommended.  I provided 25 minutes of face-to-face time during this encounter including counseling, chart review, and critical decision making was preformed.  Today's Plan of Care is based on a patient-centered health care approach known as shared decision making - the decisions, tests and treatments allow for patient preferences and values to be balanced with clinical evidence.    Future Appointments  Date Time Provider Department Center  10/25/2022  4:00 PM Adela Glimpse, NP [facility] None  04/11/2023  2:00 PM Adela Glimpse, NP [facility] None    HPI  56 y.o. female  presents for general follow up. She has Type 2 diabetes mellitus with hyperglycemia, without long-term current use of insulin (HCC); Hypertension;  Hyperlipidemia associated with type 2 diabetes mellitus (HCC); Obesity (BMI 30.0-34.9); Major depression  in partial remission (HCC); Discoloration and thickening of nails both feet; Chronic venous insufficiency; Vitamin D deficiency; CKD stage 2 due to type 2 diabetes mellitus (HCC); Diabetic peripheral neuropathy associated with type 2 diabetes mellitus (HCC); TMJ (temporomandibular joint disorder); Former smoker (quit 05/14/2021, 50 pack year hx); Diabetic retinopathy (HCC); Recurrent low back pain episodes; Financial difficulty; At risk for medication nonadherence; and Pulmonary nodules on their problem list.   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 She is working 2-3 jobs.  Does not have much time for herself.  Takes care of husband who is on disability and dialysis.   She does have hx of recurrent major depression; taking trazodone 50 mg with good results, working with therapist re her husband's situation.   She does have some intermittent mid back pain, takes tizanidine 4 mg at night PRN, also takes rare ibuprofen. Has seen Dr. Otelia Sergeant for intermittent R shoulder pain, likely impingement, would like referral to see different provider today. Pain limits at job significantly. Would like to do PT but does not have the time.   She is an intermittent smoker with 50+ pack year history, recently quit again on 10/10/2020 but restarted, 1-1.5 pack currently. She had normal CXR 2019. Denies current respiratory sx. Will plan CT at age 62.   BMI is Body mass index is 29.23 kg/m., she has been working on diet, exercise is limited. Recently started [facility], unable to tolerate Ozempic.  Has had success with weight loss and BG control. Wt Readings from Last 3 Encounters:  07/23/22 189 lb 6.4 oz (85.9 kg)  04/11/22 211 lb 3.2 oz (95.8 kg)  12/14/21 212 lb (96.2 kg)   She has not been checking BPs at home, reports was been normal at dentist, Today their BP is BP: 124/62  She does not workout. She  denies chest pain, shortness of breath, dizziness in the last few months, but notes back in Nov 2022 had about 1 week of chest tightness/radiating around her back x 1 week. She had leads misplaced during EKG work up that showed possible MI, however, had follow up with Cardiology and work up was negative.   She is on cholesterol medication (didn't tolerate atorvastatin, pravastatin, had stiffness and joint achiness, taking rosuvastatin 40 mg daily and tolerating, was off last check). Her cholesterol is not at goal. The cholesterol last visit was:   Lab Results  Component Value Date   CHOL 183 04/11/2022   HDL 64 04/11/2022   LDLCALC 94 04/11/2022   TRIG 148 04/11/2022   CHOLHDL 2.9 04/11/2022   She has been working on diet and exercise for T2DM.  Was on metformin ER 1000 mg BID, ozempic 1mg  - was tolerating had some increase in SE.  Start Mounajro 3 months ago without SE.  She is no longer taking Metformin.  Will take glipizide PRN if BG >200, she is not on bASA, she is not on ACE/ARB, she is on statin and denies foot ulcerations, increased appetite, nausea, polydipsia, polyuria, visual disturbances, vomiting, and weight loss.  She does have mild tingling burning in bil toes.  Last A1C in the office was:  Lab Results  Component Value Date   HGBA1C 11.7 (H) 04/11/2022   Last GFR: Lab Results  Component Value Date   GFRNONAA 80 05/25/2020   Patient is not currently on Vitamin D supplement, unsure of dose. Will check levels today  Lab Results  Component Value Date   VD25OH 54 04/11/2022  Current Medications:  Current Outpatient Medications on File Prior to Visit  Medication Sig Dispense Refill   albuterol (VENTOLIN HFA) 108 (90 Base) MCG/ACT inhaler Inhale 2 puffs into the lungs every 6 (six) hours as needed for wheezing or shortness of breath. 8 g 2   Cholecalciferol (VITAMIN D3) 250 MCG (10000 UT) TABS Take one tablet daily 30 tablet 0   Continuous Blood Gluc Receiver (DEXCOM G6  RECEIVER) DEVI Use to check blood sugar three times a day or as directed 1 each 11   Continuous Blood Gluc Sensor (DEXCOM G6 SENSOR) MISC Use to check blood sugar three times a day or as directed. 1 each 11   ezetimibe (ZETIA) 10 MG tablet Take 1 tab daily for cholesterol and heart attack risk reduction. 90 tablet 3   glucose blood (ONETOUCH VERIO) test strip Check fasting sugar daily (prior to breakfast). 100 each 12   losartan (COZAAR) 25 MG tablet Take 1 tab daily for blood pressure and kidney protection. 90 tablet 3   Lysine (GNP L-LYSINE) 600 MG TABS Take one tablet daily     Magnesium 500 MG TABS Take one tablet daily 30 tablet    rosuvastatin (CRESTOR) 40 MG tablet Takes 1 tablet everyday 90 tablet 3   tirzepatide (MOUNJARO) 7.5 MG/0.5ML Pen Inject  1 pen (7.5 mg)  into Skin  every 7 days  for Diabetes  (Dx:  e11.29) 2 mL 0   tiZANidine (ZANAFLEX) 4 MG capsule TAKE 1 CAPSULE BY MOUTH 3 TIMES DAILY ASNEEDED FOR MUSCLE SPASMS 270 capsule 1   traZODone (DESYREL) 150 MG tablet Take 1/3-1 tablet daily for mood and sleep. 90 tablet 3   Zinc 50 MG TABS Take by mouth.     aspirin EC 81 MG tablet Take 81 mg by mouth daily. Swallow whole. (Patient not taking: Reported on 07/23/2022)     glipiZIDE (GLUCOTROL) 5 MG tablet TAKE 1/2 TO 1 TABLET BY MOUTH 2 TO 3 TIMES DAILY WITH MEALS FOR DIABETES (Patient not taking: Reported on 07/23/2022) 90 tablet 0   metFORMIN (GLUCOPHAGE-XR) 500 MG 24 hr tablet Takes 2 tablets in the evening (Patient not taking: Reported on 07/23/2022) 360 tablet 1   Varenicline Tartrate, Starter, (CHANTIX STARTING MONTH PAK) 0.5 MG X 11 & 1 MG X 42 TBPK As directed- 0.5 mg for 11 days then 1 mg for 42 days (Patient not taking: Reported on 04/11/2022) 53 each 0   No current facility-administered medications on file prior to visit.   Allergies:  Allergies  Allergen Reactions   Atorvastatin     Myalgias   Buspirone     "felt like I was going to pass out"    Pravastatin    Medical  History:  She has Type 2 diabetes mellitus with hyperglycemia, without long-term current use of insulin (HCC); Hypertension; Hyperlipidemia associated with type 2 diabetes mellitus (HCC); Obesity (BMI 30.0-34.9); Major depression in partial remission (HCC); Discoloration and thickening of nails both feet; Chronic venous insufficiency; Vitamin D deficiency; CKD stage 2 due to type 2 diabetes mellitus (HCC); Diabetic peripheral neuropathy associated with type 2 diabetes mellitus (HCC); TMJ (temporomandibular joint disorder); Former smoker (quit 05/14/2021, 50 pack year hx); Diabetic retinopathy (HCC); Recurrent low back pain episodes; Financial difficulty; At risk for medication nonadherence; and Pulmonary nodules on their problem list.   Health Maintenance:   Immunization History  Administered Date(s) Administered   Influenza,inj,Quad PF,6+ Mos 12/28/2016   PNEUMOCOCCAL CONJUGATE-20 02/23/2021   Tdap 10/26/2016   Health Maintenance  Topic Date Due   COVID-19 Vaccine (1) Never done   Zoster Vaccines- Shingrix (1 of 2) Never done   Fecal DNA (Cologuard)  Never done   OPHTHALMOLOGY EXAM  03/10/2021   FOOT EXAM  02/23/2022   Lung Cancer Screening  03/29/2022   PAP SMEAR-Modifier  07/01/2022   INFLUENZA VACCINE  08/16/2022   HEMOGLOBIN A1C  10/12/2022   MAMMOGRAM  02/21/2023   Diabetic kidney evaluation - eGFR measurement  04/11/2023   Diabetic kidney evaluation - Urine ACR  04/11/2023   DTaP/Tdap/Td (2 - Td or Tdap) 10/27/2026   Hepatitis C Screening  Completed   HIV Screening  Completed   HPV VACCINES  Aged Out   Patient Care Team: Lucky Cowboy, MD as PCP - General (Internal Medicine) Corky Crafts, MD as PCP - Cardiology (Cardiology) Kerrin Champagne, MD (Inactive) as Consulting Physician (Orthopedic Surgery)  Surgical History:  She has a past surgical history that includes Cholecystectomy (2005) and Cesarean section (2000). Family History:  Herfamily history includes Renal  Disease in her son. She was adopted. Social History:  She reports that she has been smoking cigarettes. She started smoking about 41 years ago. She has a 50.00 pack-year smoking history. She has never used smokeless tobacco. She reports current alcohol use. She reports that she does not use drugs.   Review of Systems: A complete ROS was performed with pertinent positives/negatives noted in the HPI. The remainder of the ROS are negative.  Physical Exam: Estimated body mass index is 29.23 kg/m as calculated from the following:   Height as of this encounter: 5' 7.5" (1.715 m).   Weight as of this encounter: 189 lb 6.4 oz (85.9 kg). BP 124/62   Pulse 73   Temp 97.7 F (36.5 C)   Ht 5' 7.5" (1.715 m)   Wt 189 lb 6.4 oz (85.9 kg)   SpO2 98%   BMI 29.23 kg/m  General Appearance: Well nourished, appears older than stated age, in no apparent distress.  Eyes: PERRLA, EOMs, conjunctiva no swelling or erythema Sinuses: No Frontal/maxillary tenderness  ENT/Mouth: Ext aud canals clear, normal light reflex with TMs without erythema, bulging. Good dentition. No erythema, swelling, or exudate on post pharynx. Tonsils not swollen or erythematous. Hearing normal. Missing extensive teeth.  Neck: Supple, thyroid normal. No bruits  Respiratory: Respiratory effort normal, BS equal bilaterally without rales, rhonchi, wheezing or stridor.  Cardio: RRR without murmurs, rubs or gallops. Brisk peripheral pulses with 1+ bilateral non-pitting edema to ankles.  Chest: symmetric, with normal excursions and percussion.  Breasts: Defer to GYN Abdomen: Soft, nontender, no guarding, rebound, hernias, masses, or organomegaly.  Lymphatics: Non tender without lymphadenopathy.  Genitourinary: Defer to GYN Musculoskeletal: Full ROM bil lower extremities, 5/5 strength, and normal gait. L shoulder with limited abduction and external rotation with pain.  Skin: Warm, dry without rashes, lesions, ecchymosis. Bilateral toe nails  yellowed, brittle distally.  Neuro: Cranial nerves intact, reflexes equal bilaterally. Normal muscle tone, no cerebellar symptoms. Sensation intact to monofilament bilaterally.  Psych: Awake and oriented X 3, normal affect, Insight and Judgment appropriate.    Hiep Ollis 3:22 PM [facility]

## 2022-07-24 LAB — CBC WITH DIFFERENTIAL/PLATELET
Basophils Absolute: 108 cells/uL (ref 0–200)
Eosinophils Absolute: 353 cells/uL (ref 15–500)
Eosinophils Relative: 3.6 %
MCV: 87.8 fL (ref 80.0–100.0)
Monocytes Relative: 6.8 %
Neutro Abs: 5370 cells/uL (ref 1500–7800)
Neutrophils Relative %: 54.8 %
RDW: 12.7 % (ref 11.0–15.0)
Total Lymphocyte: 33.7 %

## 2022-07-24 LAB — COMPLETE METABOLIC PANEL WITH GFR
AG Ratio: 1.5 (calc) (ref 1.0–2.5)
ALT: 13 U/L (ref 6–29)
AST: 14 U/L (ref 10–35)
Albumin: 4 g/dL (ref 3.6–5.1)
Alkaline phosphatase (APISO): 61 U/L (ref 37–153)
BUN: 23 mg/dL (ref 7–25)
CO2: 27 mmol/L (ref 20–32)
Calcium: 9.7 mg/dL (ref 8.6–10.4)
Chloride: 106 mmol/L (ref 98–110)
Creat: 0.88 mg/dL (ref 0.50–1.03)
Globulin: 2.6 g/dL (calc) (ref 1.9–3.7)
Glucose, Bld: 98 mg/dL (ref 65–99)
Potassium: 4.5 mmol/L (ref 3.5–5.3)
Sodium: 140 mmol/L (ref 135–146)
Total Bilirubin: 0.3 mg/dL (ref 0.2–1.2)
Total Protein: 6.6 g/dL (ref 6.1–8.1)
eGFR: 78 mL/min/{1.73_m2} (ref >=60)

## 2022-07-24 LAB — LIPID PANEL
Cholesterol: 199 mg/dL (ref <200)
HDL: 58 mg/dL (ref >=50)
LDL Cholesterol (Calc): 116 mg/dL (calc) — ABNORMAL HIGH
Non-HDL Cholesterol (Calc): 141 mg/dL (calc) — ABNORMAL HIGH (ref <130)
Total CHOL/HDL Ratio: 3.4 (calc) (ref <5.0)
Triglycerides: 136 mg/dL (ref <150)

## 2022-07-24 LAB — HEMOGLOBIN A1C W/OUT EAG: Hgb A1c MFr Bld: 6.9 % of total Hgb — ABNORMAL HIGH (ref <5.7)

## 2022-08-04 ENCOUNTER — Encounter: Payer: Self-pay | Admitting: Nurse Practitioner

## 2022-08-04 DIAGNOSIS — E1165 Type 2 diabetes mellitus with hyperglycemia: Secondary | ICD-10-CM

## 2022-08-05 MED ORDER — DEXCOM G6 RECEIVER DEVI
11 refills | Status: DC
Start: 2022-08-05 — End: 2022-08-31

## 2022-08-29 ENCOUNTER — Encounter: Payer: Self-pay | Admitting: Nurse Practitioner

## 2022-08-29 ENCOUNTER — Ambulatory Visit (INDEPENDENT_AMBULATORY_CARE_PROVIDER_SITE_OTHER): Payer: BC Managed Care – PPO | Admitting: Nurse Practitioner

## 2022-08-29 VITALS — BP 110/70 | HR 91 | Temp 97.5°F | Ht 67.5 in | Wt 184.4 lb

## 2022-08-29 DIAGNOSIS — E1165 Type 2 diabetes mellitus with hyperglycemia: Secondary | ICD-10-CM

## 2022-08-29 DIAGNOSIS — N182 Chronic kidney disease, stage 2 (mild): Secondary | ICD-10-CM

## 2022-08-29 DIAGNOSIS — M7918 Myalgia, other site: Secondary | ICD-10-CM | POA: Diagnosis not present

## 2022-08-29 DIAGNOSIS — R229 Localized swelling, mass and lump, unspecified: Secondary | ICD-10-CM

## 2022-08-29 DIAGNOSIS — E1122 Type 2 diabetes mellitus with diabetic chronic kidney disease: Secondary | ICD-10-CM

## 2022-08-29 MED ORDER — MOUNJARO 10 MG/0.5ML ~~LOC~~ SOAJ: 10.0000 mg | SUBCUTANEOUS | 0 refills | Status: DC

## 2022-08-29 MED ORDER — FREESTYLE LIBRE 3 SENSOR MISC: 3 refills | Status: DC

## 2022-08-29 NOTE — Patient Instructions (Signed)
 Tirzepatide Injection What is this medication? TIRZEPATIDE (tir ZEP a tide) treats type 2 diabetes. It works by increasing insulin levels in your body, which decreases your blood sugar (glucose). It also reduces the amount of sugar released into your blood and slows down your digestion. Changes to diet and exercise are often combined with this medication. This medicine may be used for other purposes; ask your health care provider or pharmacist if you have questions. COMMON BRAND NAME(S): MOUNJARO What should I tell my care team before I take this medication? They need to know if you have any of these conditions: Endocrine tumors (MEN 2) or if someone in your family had these tumors Eye disease, vision problems Gallbladder disease History of pancreatitis Kidney disease Stomach or intestine problems Thyroid cancer or if someone in your family had thyroid cancer An unusual or allergic reaction to tirzepatide, other medications, foods, dyes, or preservatives Pregnant or trying to get pregnant Breast-feeding How should I use this medication? This medication is injected under the skin. You will be taught how to prepare and give it. It is given once every week (every 7 days). Keep taking it unless your health care provider tells you to stop. If you use this medication with insulin, you should inject this medication and the insulin separately. Do not mix them together. Do not give the injections right next to each other. Change (rotate) injection sites with each injection. This medication comes with INSTRUCTIONS FOR USE. Ask your pharmacist for directions on how to use this medication. Read the information carefully. Talk to your pharmacist or care team if you have questions. It is important that you put your used needles and syringes in a special sharps container. Do not put them in a trash can. If you do not have a sharps container, call your pharmacist or care team to get one. A special MedGuide  will be given to you by the pharmacist with each prescription and refill. Be sure to read this information carefully each time. Talk to your care team about the use of this medication in children. Special care may be needed. Overdosage: If you think you have taken too much of this medicine contact a poison control center or emergency room at once. NOTE: This medicine is only for you. Do not share this medicine with others. What if I miss a dose? If you miss a dose, take it as soon as you can unless it is more than 4 days (96 hours) late. If it is more than 4 days late, skip the missed dose. Take the next dose at the normal time. Do not take 2 doses within 3 days of each other. What may interact with this medication? Alcohol Antiviral medications for HIV or AIDS Aspirin and aspirin-like medications Beta blockers, such as atenolol, metoprolol, propranolol Certain medications for blood pressure, heart disease, irregular heart beat Chromium Clonidine Diuretics Estrogen or progestin hormones Fenofibrate Gemfibrozil Guanethidine Isoniazid Lanreotide Female hormones or anabolic steroids MAOIs, such as Marplan, Nardil, and Parnate Medications for weight loss Medications for allergies, asthma, cold, or cough Medications for depression, anxiety, or mental health conditions Niacin Nicotine NSAIDs, medications for pain and inflammation, such as ibuprofen or naproxen Octreotide Other medications for diabetes, such as glyburide, glipizide, or glimepiride Pasireotide Pentamidine Phenytoin Probenecid Quinolone antibiotics, such as ciprofloxacin, levofloxacin, ofloxacin Reserpine Some herbal dietary supplements Steroid medications, such as prednisone or cortisone Sulfamethoxazole; trimethoprim Thyroid hormones Warfarin This list may not describe all possible interactions. Give your health care provider a  list of all the medicines, herbs, non-prescription drugs, or dietary supplements you use.  Also tell them if you smoke, drink alcohol, or use illegal drugs. Some items may interact with your medicine. What should I watch for while using this medication? Visit your care team for regular checks on your progress. You may need blood work done while you are taking this medication. Your care team will monitor your HbA1C (A1C). This test shows what your average blood sugar (glucose) level was over the past 2 to 3 months. Know the symptoms of low blood sugar and know how to treat it. Always carry a source of quick sugar with you. Examples include hard sugar candy or glucose tablets. Make sure others know that you can choke if you eat or drink if your blood sugar is too low and you are unable to care for yourself. Get medical help at once. Tell your care team if you have high blood sugar. Your medication dose may change if your body is under stress. Some types of stress that may affect your blood sugar include fever, infection, and surgery. Check with your care team if you have severe diarrhea, nausea, and vomiting, or if you sweat a lot. The loss of too much body fluid may make it dangerous for you to take this medication. Do not share pens or cartridges with anyone, even if the needle is changed. Each pen should only be used by one person. Sharing could cause an infection. Wear a medical ID bracelet or chain. Carry a card that describes your condition. List the medications and doses you take on the card. Talk to your care team about your risk of cancer. You may be more at risk for certain types of cancer if you take this medication. Estrogen and progestin hormones may not work as well while you are taking this medication. If you take these as pills by mouth, your care team may recommend another type of contraception for 4 weeks after you start this medication and for 4 weeks after each dose increase. Talk to your care team about contraceptive options. They can help you find the option that works for  you. What side effects may I notice from receiving this medication? Side effects that you should report to your care team as soon as possible: Allergic reactions or angioedema--skin rash, itching or hives, swelling of the face, eyes, lips, tongue, arms, or legs, trouble swallowing or breathing Change in vision Dehydration--increased thirst, dry mouth, feeling faint or lightheaded, headache, dark yellow or brown urine Gallbladder problems--severe stomach pain, nausea, vomiting, fever Kidney injury--decrease in the amount of urine, swelling of the ankles, hands, or feet Pancreatitis--severe stomach pain that spreads to your back or gets worse after eating or when touched, fever, nausea, vomiting Thoughts of suicide or self-harm, worsening mood, feelings of depression Thyroid cancer--new mass or lump in the neck, pain or trouble swallowing, trouble breathing, hoarseness Side effects that usually do not require medical attention (report these to your care team if they continue or are bothersome): Diarrhea Loss of appetite Nausea Upset stomach This list may not describe all possible side effects. Call your doctor for medical advice about side effects. You may report side effects to FDA at 1-800-FDA-1088. Where should I keep my medication? Keep out of the reach of children and pets. Refrigeration (preferred): Store in the refrigerator. Keep this medication in the original carton until you are ready to take it. Do not freeze. Protect from light. Get rid of opened vials  after use, even if there is medication left. Get rid of any unopened vials or pens after the expiration date. Room Temperature: This medication may be stored at room temperature below 30 degrees C (86 degrees F) for up to 21 days. Keep this medication in the original carton until you are ready to take it. Protect from light. Avoid exposure to extreme heat. Get rid of opened vials after use, even if there is medication left. Get rid of any  unopened vials or pens after 21 days, or after they expire, whichever is first. To get rid of medications that are no longer needed or have expired: Take the medication to a medication take-back program. Check with your pharmacy or law enforcement to find a location. If you cannot return the medication, ask your pharmacist or care team how to get rid of this medication safely. NOTE: This sheet is a summary. It may not cover all possible information. If you have questions about this medicine, talk to your doctor, pharmacist, or health care provider.  2024 Elsevier/Gold Standard (2022-04-26 00:00:00)

## 2022-08-29 NOTE — Progress Notes (Signed)
Assessment and Plan:  Danielle Mccall was seen today for an episodic visit.  Diagnoses and all order for this visit:  Type 2 diabetes mellitus with hyperglycemia, without long-term current use of insulin [facility]) Education: Reviewed 'ABCs' of diabetes management  Discussed goals to be met and/or maintained include A1C (<7) Blood pressure (<130/80) Cholesterol (LDL <70) Continue Eye Exam yearly  Continue Dental Exam Q6 mo Discussed dietary recommendations Discussed Physical Activity recommendations Check A1C  - tirzepatide (MOUNJARO) 10 MG/0.5ML Pen; Inject 10 mg into the skin once a week.  Dispense: 2 mL; Refill: 0  CKD stage 2 due to type 2 diabetes mellitus (HCC) Discussed how what you eat and drink can aide in kidney protection. Stay well hydrated. Avoid high salt foods. Avoid NSAIDS. Keep BP and BG well controlled.   Take medications as prescribed. Remain active and exercise as tolerated daily. Maintain weight.  Continue to monitor. Check CMP/GFR/Microablumin  Skin nodule/Buttock pain Continue to monitor for increase in redness, swelling, pustule, pain. Notify office for further review and evaluation.  - DG Sacrum/Coccyx; Future   Notify office for further evaluation and treatment, questions or concerns if s/s fail to improve. The risks and benefits of my recommendations, as well as other treatment options were discussed with the patient today. Questions were answered.  Further disposition pending results of labs. Discussed med's effects and SE's.    Over 20 minutes of exam, counseling, chart review, and critical decision making was performed.   Future Appointments  Date Time Provider Department Center  10/25/2022  4:00 PM Adela Glimpse, NP [facility] None  04/11/2023  2:00 PM , Archie Patten, NP [facility] None    ------------------------------------------------------------------------------------------------------------------   HPI BP 110/70   Pulse 91    Temp (!) 97.5 F (36.4 C)   Ht 5' 7.5" (1.715 m)   Wt 184 lb 6.4 oz (83.6 kg)   SpO2 98%   BMI 28.45 kg/m   57 y.o.female presents for evaluation of intermittent sacral pain that has become more frequent over the last few months.  Patient states that approximately 1 year ago she started to noticed intermittent tail bone pain when in the bath.  However, she has now noticed the pain every time she takes a bath or when she is laying down in her bed.  Of note she has lost >100 lb over the last year.  She denies any new or recent injury, fall or trauma.  The area of skin is normal and without redness, pustule or skin breakdown.    BMI is Body mass index is 28.45 kg/m., she has been working on diet and exercise and continue Mounjaro as directed. Wt Readings from Last 3 Encounters:  08/29/22 184 lb 6.4 oz (83.6 kg)  07/23/22 189 lb 6.4 oz (85.9 kg)  04/11/22 211 lb 3.2 oz (95.8 kg)    Her last A1c: Lab Results  Component Value Date   HGBA1C 6.9 (H) 07/23/2022     Past Medical History:  Diagnosis Date   Leg pain, left    Statin intolerance 03/28/2018     Allergies  Allergen Reactions   Atorvastatin     Myalgias   Buspirone     "felt like I was going to pass out"    Pravastatin     Current Outpatient Medications on File Prior to Visit  Medication Sig   albuterol (VENTOLIN HFA) 108 (90 Base) MCG/ACT inhaler Inhale 2 puffs into the lungs every 6 (six) hours as needed for wheezing or shortness  of breath.   Cholecalciferol (VITAMIN D3) 250 MCG (10000 UT) TABS Take one tablet daily   Continuous Blood Gluc Sensor (DEXCOM G6 SENSOR) MISC Use to check blood sugar three times a day or as directed.   Continuous Glucose Receiver (DEXCOM G6 RECEIVER) DEVI Use to check blood sugar three times a day or as directed   ezetimibe (ZETIA) 10 MG tablet Take 1 tab daily for cholesterol and heart attack risk reduction.   glucose blood (ONETOUCH VERIO) test strip Check fasting sugar daily (prior to  breakfast).   losartan (COZAAR) 25 MG tablet Take 1 tab daily for blood pressure and kidney protection.   Lysine (GNP L-LYSINE) 600 MG TABS Take one tablet daily   Magnesium 500 MG TABS Take one tablet daily   rosuvastatin (CRESTOR) 40 MG tablet Takes 1 tablet everyday   tirzepatide (MOUNJARO) 7.5 MG/0.5ML Pen Inject  1 pen (7.5 mg)  into Skin  every 7 days  for Diabetes  (Dx:  e11.29)   tiZANidine (ZANAFLEX) 4 MG capsule TAKE 1 CAPSULE BY MOUTH 3 TIMES DAILY ASNEEDED FOR MUSCLE SPASMS   traZODone (DESYREL) 150 MG tablet Take 1/3-1 tablet daily for mood and sleep.   Zinc 50 MG TABS Take by mouth.   aspirin EC 81 MG tablet Take 81 mg by mouth daily. Swallow whole. (Patient not taking: Reported on 07/23/2022)   glipiZIDE (GLUCOTROL) 5 MG tablet TAKE 1/2 TO 1 TABLET BY MOUTH 2 TO 3 TIMES DAILY WITH MEALS FOR DIABETES (Patient not taking: Reported on 07/23/2022)   metFORMIN (GLUCOPHAGE-XR) 500 MG 24 hr tablet Takes 2 tablets in the evening (Patient not taking: Reported on 07/23/2022)   Varenicline Tartrate, Starter, (CHANTIX STARTING MONTH PAK) 0.5 MG X 11 & 1 MG X 42 TBPK As directed- 0.5 mg for 11 days then 1 mg for 42 days (Patient not taking: Reported on 04/11/2022)   No current facility-administered medications on file prior to visit.    ROS: all negative except what is noted in the HPI.   Physical Exam:  BP 110/70   Pulse 91   Temp (!) 97.5 F (36.4 C)   Ht 5' 7.5" (1.715 m)   Wt 184 lb 6.4 oz (83.6 kg)   SpO2 98%   BMI 28.45 kg/m   General Appearance: NAD.  Awake, conversant and cooperative. Eyes: PERRLA, EOMs intact.  Sclera white.  Conjunctiva without erythema. Sinuses: No frontal/maxillary tenderness.  No nasal discharge. Nares patent.  ENT/Mouth: Ext aud canals clear.  Bilateral TMs w/DOL and without erythema or bulging. Hearing intact.  Posterior pharynx without swelling or exudate.  Tonsils without swelling or erythema.  Neck: Supple.  No masses, nodules or  thyromegaly. Respiratory: Effort is regular with non-labored breathing. Breath sounds are equal bilaterally without rales, rhonchi, wheezing or stridor.  Cardio: RRR with no MRGs. Brisk peripheral pulses without edema.  Abdomen: Active BS in all four quadrants.  Soft and non-tender without guarding, rebound tenderness, hernias or masses. Lymphatics: Non tender without lymphadenopathy.  Musculoskeletal: Full ROM, 5/5 strength, normal ambulation.  No clubbing or cyanosis. Skin: Sacral area WNL and without any skin changes.  Noted to have around hard moveable approximately 3 cm nodule.  Non tender to palpation.   Neuro: CN II-XII grossly normal. Normal muscle tone without cerebellar symptoms and intact sensation.   Psych: AO X 3,  appropriate mood and affect, insight and judgment.     Adela Glimpse, NP 4:10 PM [facility]

## 2022-08-31 ENCOUNTER — Other Ambulatory Visit: Payer: Self-pay

## 2022-08-31 DIAGNOSIS — E1165 Type 2 diabetes mellitus with hyperglycemia: Secondary | ICD-10-CM

## 2022-08-31 MED ORDER — DEXCOM G7 RECEIVER DEVI: 2 refills | Status: AC

## 2022-08-31 MED ORDER — DEXCOM G7 SENSOR MISC: 3 refills | Status: AC

## 2022-09-03 ENCOUNTER — Encounter: Payer: Self-pay | Admitting: Nurse Practitioner

## 2022-09-13 ENCOUNTER — Ambulatory Visit (INDEPENDENT_AMBULATORY_CARE_PROVIDER_SITE_OTHER): Payer: BC Managed Care – PPO | Admitting: Nurse Practitioner

## 2022-09-13 ENCOUNTER — Encounter: Payer: Self-pay | Admitting: Nurse Practitioner

## 2022-09-13 VITALS — BP 122/62 | HR 76 | Temp 97.4°F | Ht 67.5 in | Wt 175.8 lb

## 2022-09-13 DIAGNOSIS — F17200 Nicotine dependence, unspecified, uncomplicated: Secondary | ICD-10-CM

## 2022-09-13 DIAGNOSIS — H9202 Otalgia, left ear: Secondary | ICD-10-CM | POA: Diagnosis not present

## 2022-09-13 DIAGNOSIS — H60592 Other noninfective acute otitis externa, left ear: Secondary | ICD-10-CM | POA: Diagnosis not present

## 2022-09-13 NOTE — Progress Notes (Signed)
Assessment and Plan:  Danielle Mccall was seen today for an episodic visit.  Diagnoses and all order for this visit:  Other noninfective acute otitis externa of left ear May apply topical Hydrocortisone 1% cream to outer ear canal up to TID PRN. Discussed smokcing cessation. Continue to monitor for worsening symptoms. Contact office if s/s fail to improve.  Otalgia of left ear OTC Tylenol or Ibuprofen as needed as directed. May apply heating pad to ear PRN  Current smoker Smoking cessation instruction/counseling given:  counseled patient on the dangers of tobacco use, advised patient to stop smoking, and reviewed strategies to maximize success   Notify office for further evaluation and treatment, questions or concerns if s/s fail to improve. The risks and benefits of my recommendations, as well as other treatment options were discussed with the patient today. Questions were answered.  Further disposition pending results of labs. Discussed med's effects and SE's.    Over 15 minutes of exam, counseling, chart review, and critical decision making was performed.   Future Appointments  Date Time Provider Department Center  10/25/2022  4:00 PM Adela Glimpse, NP [facility] None  04/11/2023  2:00 PM Deniz Hannan, Archie Patten, NP [facility] None    ------------------------------------------------------------------------------------------------------------------   HPI BP 122/62   Pulse 76   Temp (!) 97.4 F (36.3 C)   Ht 5' 7.5" (1.715 m)   Wt 175 lb 12.8 oz (79.7 kg)   SpO2 98%   BMI 27.13 kg/m   Patient presents with pain, pressure.  Symptoms have been present for 1 week.  During that time there has not been pus draining out of the. Previous treatment: none. There has not been some improvement with current treatment.  There does not have been a recent history of swimming. She uses daily Q-tips. She is a current every day smoker.   Past Medical History:  Diagnosis Date   Leg pain,  left    Statin intolerance 03/28/2018     Allergies  Allergen Reactions   Atorvastatin     Myalgias   Buspirone     "felt like I was going to pass out"    Pravastatin     Current Outpatient Medications on File Prior to Visit  Medication Sig   albuterol (VENTOLIN HFA) 108 (90 Base) MCG/ACT inhaler Inhale 2 puffs into the lungs every 6 (six) hours as needed for wheezing or shortness of breath.   aspirin EC 81 MG tablet Take 81 mg by mouth daily. Swallow whole.   Cholecalciferol (VITAMIN D3) 250 MCG (10000 UT) TABS Take one tablet daily   Continuous Glucose Receiver (DEXCOM G7 RECEIVER) DEVI Use to check blood sugar up to four times a day or as directed   Continuous Glucose Sensor (DEXCOM G7 SENSOR) MISC Use to check blood sugar up to four times a day or as directed.   ezetimibe (ZETIA) 10 MG tablet Take 1 tab daily for cholesterol and heart attack risk reduction.   glipiZIDE (GLUCOTROL) 5 MG tablet TAKE 1/2 TO 1 TABLET BY MOUTH 2 TO 3 TIMES DAILY WITH MEALS FOR DIABETES   glucose blood (ONETOUCH VERIO) test strip Check fasting sugar daily (prior to breakfast).   losartan (COZAAR) 25 MG tablet Take 1 tab daily for blood pressure and kidney protection.   Lysine (GNP L-LYSINE) 600 MG TABS Take one tablet daily   Magnesium 500 MG TABS Take one tablet daily   metFORMIN (GLUCOPHAGE-XR) 500 MG 24 hr tablet Takes 2 tablets in the evening   rosuvastatin (  CRESTOR) 40 MG tablet Takes 1 tablet everyday   tirzepatide (MOUNJARO) 10 MG/0.5ML Pen Inject 10 mg into the skin once a week.   tiZANidine (ZANAFLEX) 4 MG capsule TAKE 1 CAPSULE BY MOUTH 3 TIMES DAILY ASNEEDED FOR MUSCLE SPASMS   traZODone (DESYREL) 150 MG tablet Take 1/3-1 tablet daily for mood and sleep.   Varenicline Tartrate, Starter, (CHANTIX STARTING MONTH PAK) 0.5 MG X 11 & 1 MG X 42 TBPK As directed- 0.5 mg for 11 days then 1 mg for 42 days   Zinc 50 MG TABS Take by mouth.   No current facility-administered medications on file prior to  visit.    ROS: all negative except what is noted in the HPI.   Physical Exam:  BP 122/62   Pulse 76   Temp (!) 97.4 F (36.3 C)   Ht 5' 7.5" (1.715 m)   Wt 175 lb 12.8 oz (79.7 kg)   SpO2 98%   BMI 27.13 kg/m   General Appearance: NAD.  Awake, conversant and cooperative. Eyes: PERRLA, EOMs intact.  Sclera white.  Conjunctiva without erythema. Sinuses: No frontal/maxillary tenderness.  No nasal discharge. Nares patent.  ENT/Mouth: Ext aud canals clear.  Left external canal with mild erythema.  TM w/DOL and without erythema or bulging. Hearing intact.  Posterior pharynx without swelling or exudate.  Tonsils without swelling or erythema.  Neck: Supple.  No masses, nodules or thyromegaly. Respiratory: Effort is regular with non-labored breathing. Breath sounds are equal bilaterally without rales, rhonchi, wheezing or stridor.  Cardio: RRR with no MRGs. Brisk peripheral pulses without edema.  Abdomen: Active BS in all four quadrants.  Soft and non-tender without guarding, rebound tenderness, hernias or masses. Lymphatics: Non tender without lymphadenopathy.  Musculoskeletal: Full ROM, 5/5 strength, normal ambulation.  No clubbing or cyanosis. Skin: Appropriate color for ethnicity. Warm without rashes, lesions, ecchymosis, ulcers.  Neuro: CN II-XII grossly normal. Normal muscle tone without cerebellar symptoms and intact sensation.   Psych: AO X 3,  appropriate mood and affect, insight and judgment.     Adela Glimpse, NP 3:39 PM [facility]

## 2022-09-13 NOTE — Patient Instructions (Signed)
Otitis Externa  Otitis externa is an infection of the outer ear canal. The outer ear canal is the area between the outside of the ear and the eardrum. Otitis externa is sometimes called swimmer's ear. What are the causes? Common causes of this condition include: Swimming in dirty water. Moisture in the ear. An injury to the inside of the ear. An object stuck in the ear. A cut or scrape on the outside of the ear or in the ear canal. What increases the risk? You are more likely to get this condition if you go swimming often. What are the signs or symptoms? Itching in the ear. This is often the first symptom. Swelling of the ear. Redness in the ear. Ear pain. The pain may get worse when you pull on your ear. Pus coming from the ear. How is this treated? This condition may be treated with: Antibiotic ear drops. These are often given for 10-14 days. Medicines to reduce itching and swelling. Follow these instructions at home: If you were prescribed antibiotic ear drops, use them as told by your doctor. Do not stop using them even if you start to feel better. Take over-the-counter and prescription medicines only as told by your doctor. Avoid getting water in your ears as told by your doctor. You may be told to avoid swimming or water sports for a few days. Keep all follow-up visits. How is this prevented? Keep your ears dry. Use the corner of a towel to dry your ears after you swim or bathe. Try not to scratch or put things in your ear. Doing these things makes it easier for germs to grow in your ear. Avoid swimming in lakes, dirty water, or swimming pools that may not have the right amount of a chemical called chlorine. Contact a doctor if: You have a fever. Your ear is still red, swollen, or painful after 3 days. You still have pus coming from your ear after 3 days. Your redness, swelling, or pain gets worse. You have a very bad headache. Get help right away if: You have redness,  swelling, and pain or tenderness behind your ear. Summary Otitis externa is an infection of the outer ear canal. Symptoms include pain, redness, and swelling of the ear. If you were prescribed antibiotic ear drops, use them as told by your doctor. Do not stop using them even if you start to feel better. Try not to scratch or put things in your ear. This information is not intended to replace advice given to you by your health care provider. Make sure you discuss any questions you have with your health care provider. Document Revised: 03/16/2020 Document Reviewed: 03/16/2020 Elsevier Patient Education  2024 [employer]  

## 2022-10-01 ENCOUNTER — Encounter: Payer: Self-pay | Admitting: Nurse Practitioner

## 2022-10-01 ENCOUNTER — Other Ambulatory Visit: Payer: Self-pay | Admitting: Nurse Practitioner

## 2022-10-01 DIAGNOSIS — E1165 Type 2 diabetes mellitus with hyperglycemia: Secondary | ICD-10-CM

## 2022-10-01 MED ORDER — MOUNJARO 12.5 MG/0.5ML ~~LOC~~ SOAJ: 12.5000 mg | SUBCUTANEOUS | 2 refills | Status: DC

## 2022-10-02 ENCOUNTER — Telehealth: Payer: Self-pay

## 2022-10-02 NOTE — Telephone Encounter (Signed)
Mounjaro prior [place] approved   Prior auth completed and submitted.

## 2022-10-25 ENCOUNTER — Ambulatory Visit: Payer: BC Managed Care – PPO | Admitting: Nurse Practitioner

## 2022-10-25 NOTE — Progress Notes (Deleted)
Follow Up  Assessment and Plan:  Type 2 diabetes mellitus with hyperglycemia, without long-term current use of insulin (HCC) Continue Mounjaro Continue Glipizide PRN Continue Libre censoring. Education: Reviewed 'ABCs' of diabetes management  Discussed goals to be met and/or maintained include A1C (<7) Blood pressure (<130/80) Cholesterol (LDL <70) Continue Eye Exam yearly  Continue Dental Exam Q6 mo Discussed dietary recommendations Discussed Physical Activity recommendations Check A1C  Hyperlipidemia associated with type 2 diabetes mellitus (HCC) Continue Rosuvastatin Discussed lifestyle modifications. Recommended diet heavy in fruits and veggies, omega 3's. Decrease consumption of animal meats, cheeses, and dairy products. Remain active and exercise as tolerated. Continue to monitor. Check lipids/TSH  Obesity (BMI 30.0-34.9) Trending down Discussed appropriate BMI Diet modification. Physical activity. Encouraged/praised to build confidence.  Major depressive disorder in partial remission, unspecified whether recurrent (HCC) Continue trazodone and therapy  Lifestyle discussed: diet/exerise, sleep hygiene, stress management, hydration  Hypertension Discussed DASH (Dietary Approaches to Stop Hypertension) DASH diet is lower in sodium than a typical American diet. Cut back on foods that are high in saturated fat, cholesterol, and trans fats. Eat more whole-grain foods, fish, poultry, and nuts Remain active and exercise as tolerated daily.  Monitor BP at home-Call if greater than 130/80.  Check CMP/CBC  Medication management All medications discussed and reviewed in full. All questions and concerns regarding medications addressed.    Current intermittent smoker 50+ year smoking hx; back to smoking Discussed risks associated with tobacco use and advised to reduce or quit Smoking cessation instruction/counseling given:  counseled patient on the dangers of tobacco  use, advised patient to stop smoking, and reviewed strategies to maximize success Start low dose CT scan at age 73  Venous insufficiency Stable/chronic, NOT NEW, encourage compression, increased walking, elevate when able, low sodium diet Continue to monitor  Vitamin D deficiency Continue supplement for a goal of 60-100 Monitor levels     Notify office for further evaluation and treatment, questions or concerns if any reported s/s fail to improve.   The patient was advised to call back or seek an in-person evaluation if any symptoms worsen or if the condition fails to improve as anticipated.   Further disposition pending results of labs. Discussed med's effects and SE's.    I discussed the assessment and treatment plan with the patient. The patient was provided an opportunity to ask questions and all were answered. The patient agreed with the plan and demonstrated an understanding of the instructions.  Discussed med's effects and SE's. Screening labs and tests as requested with regular follow-up as recommended.  I provided 25 minutes of face-to-face time during this encounter including counseling, chart review, and critical decision making was preformed.  Today's Plan of Care is based on a patient-centered health care approach known as shared decision making - the decisions, tests and treatments allow for patient preferences and values to be balanced with clinical evidence.    Future Appointments  Date Time Provider Department Center  10/25/2022  4:00 PM Adela Glimpse, NP [facility] None  04/11/2023  2:00 PM Adela Glimpse, NP [facility] None    HPI  57 y.o. female  presents for general follow up. She has Type 2 diabetes mellitus with hyperglycemia, without long-term current use of insulin (HCC); Hypertension; Hyperlipidemia associated with type 2 diabetes mellitus (HCC); Obesity (BMI 30.0-34.9); Major depression in partial remission (HCC); Discoloration and thickening of nails  both feet; Chronic venous insufficiency; Vitamin D deficiency; CKD stage 2 due to type 2 diabetes mellitus (HCC); Diabetic peripheral  neuropathy associated with type 2 diabetes mellitus (HCC); TMJ (temporomandibular joint disorder); Former smoker (quit 05/14/2021, 50 pack year hx); Diabetic retinopathy (HCC); Recurrent low back pain episodes; Financial difficulty; At risk for medication nonadherence; and Pulmonary nodules on their problem list.   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 She is working 2-3 jobs.  Does not have much time for herself.  Takes care of husband who is on disability and dialysis.   She does have hx of recurrent major depression; taking trazodone 50 mg with good results, working with therapist re her husband's situation.   She does have some intermittent mid back pain, takes tizanidine 4 mg at night PRN, also takes rare ibuprofen. Has seen Dr. Otelia Sergeant for intermittent R shoulder pain, likely impingement, would like referral to see different provider today. Pain limits at job significantly. Would like to do PT but does not have the time.   She is an intermittent smoker with 50+ pack year history, recently quit again on 10/10/2020 but restarted, 1-1.5 pack currently. She had normal CXR 2019. Denies current respiratory sx. Will plan CT at age 47.   BMI is There is no height or weight on file to calculate BMI., she has been working on diet, exercise is limited. Recently started [facility], unable to tolerate Ozempic.  Has had success with weight loss and BG control. Wt Readings from Last 3 Encounters:  09/13/22 175 lb 12.8 oz (79.7 kg)  08/29/22 184 lb 6.4 oz (83.6 kg)  07/23/22 189 lb 6.4 oz (85.9 kg)   She has not been checking BPs at home, reports was been normal at dentist, Today their BP is    She does not workout. She denies chest pain, shortness of breath, dizziness in the last few months, but notes back in Nov 2022 had about 1 week of chest tightness/radiating  around her back x 1 week. She had leads misplaced during EKG work up that showed possible MI, however, had follow up with Cardiology and work up was negative.   She is on cholesterol medication (didn't tolerate atorvastatin, pravastatin, had stiffness and joint achiness, taking rosuvastatin 40 mg daily and tolerating, was off last check). Her cholesterol is not at goal. The cholesterol last visit was:   Lab Results  Component Value Date   CHOL 199 07/23/2022   HDL 58 07/23/2022   LDLCALC 116 (H) 07/23/2022   TRIG 136 07/23/2022   CHOLHDL 3.4 07/23/2022   She has been working on diet and exercise for T2DM.  Was on metformin ER 1000 mg BID, ozempic 1mg  - was tolerating had some increase in SE.  Start Mounajro 3 months ago without SE.  She is no longer taking Metformin.  Will take glipizide PRN if BG >200, she is not on bASA, she is not on ACE/ARB, she is on statin and denies foot ulcerations, increased appetite, nausea, polydipsia, polyuria, visual disturbances, vomiting, and weight loss.  She does have mild tingling burning in bil toes.  Last A1C in the office was:  Lab Results  Component Value Date   HGBA1C 6.9 (H) 07/23/2022   Last GFR: Lab Results  Component Value Date   GFRNONAA 80 05/25/2020   Patient is not currently on Vitamin D supplement, unsure of dose. Will check levels today  Lab Results  Component Value Date   VD25OH 54 04/11/2022       Current Medications:  Current Outpatient Medications on File Prior to Visit  Medication Sig Dispense Refill  albuterol (VENTOLIN HFA) 108 (90 Base) MCG/ACT inhaler Inhale 2 puffs into the lungs every 6 (six) hours as needed for wheezing or shortness of breath. 8 g 2   aspirin EC 81 MG tablet Take 81 mg by mouth daily. Swallow whole.     Cholecalciferol (VITAMIN D3) 250 MCG (10000 UT) TABS Take one tablet daily 30 tablet 0   Continuous Glucose Receiver (DEXCOM G7 RECEIVER) DEVI Use to check blood sugar up to four times a day or as  directed 1 each 2   Continuous Glucose Sensor (DEXCOM G7 SENSOR) MISC Use to check blood sugar up to four times a day or as directed. 2 each 3   ezetimibe (ZETIA) 10 MG tablet Take 1 tab daily for cholesterol and heart attack risk reduction. 90 tablet 3   glipiZIDE (GLUCOTROL) 5 MG tablet TAKE 1/2 TO 1 TABLET BY MOUTH 2 TO 3 TIMES DAILY WITH MEALS FOR DIABETES 90 tablet 0   glucose blood (ONETOUCH VERIO) test strip Check fasting sugar daily (prior to breakfast). 100 each 12   losartan (COZAAR) 25 MG tablet Take 1 tab daily for blood pressure and kidney protection. 90 tablet 3   Lysine (GNP L-LYSINE) 600 MG TABS Take one tablet daily     Magnesium 500 MG TABS Take one tablet daily 30 tablet    metFORMIN (GLUCOPHAGE-XR) 500 MG 24 hr tablet Takes 2 tablets in the evening 360 tablet 1   rosuvastatin (CRESTOR) 40 MG tablet Takes 1 tablet everyday 90 tablet 3   tirzepatide (MOUNJARO) 12.5 MG/0.5ML Pen Inject 12.5 mg into the skin once a week. 2 mL 2   tiZANidine (ZANAFLEX) 4 MG capsule TAKE 1 CAPSULE BY MOUTH 3 TIMES DAILY ASNEEDED FOR MUSCLE SPASMS 270 capsule 1   traZODone (DESYREL) 150 MG tablet Take 1/3-1 tablet daily for mood and sleep. 90 tablet 3   Varenicline Tartrate, Starter, (CHANTIX STARTING MONTH PAK) 0.5 MG X 11 & 1 MG X 42 TBPK As directed- 0.5 mg for 11 days then 1 mg for 42 days 53 each 0   Zinc 50 MG TABS Take by mouth.     No current facility-administered medications on file prior to visit.   Allergies:  Allergies  Allergen Reactions   Atorvastatin     Myalgias   Buspirone     "felt like I was going to pass out"    Pravastatin    Medical History:  She has Type 2 diabetes mellitus with hyperglycemia, without long-term current use of insulin (HCC); Hypertension; Hyperlipidemia associated with type 2 diabetes mellitus (HCC); Obesity (BMI 30.0-34.9); Major depression in partial remission (HCC); Discoloration and thickening of nails both feet; Chronic venous insufficiency; Vitamin  D deficiency; CKD stage 2 due to type 2 diabetes mellitus (HCC); Diabetic peripheral neuropathy associated with type 2 diabetes mellitus (HCC); TMJ (temporomandibular joint disorder); Former smoker (quit 05/14/2021, 50 pack year hx); Diabetic retinopathy (HCC); Recurrent low back pain episodes; Financial difficulty; At risk for medication nonadherence; and Pulmonary nodules on their problem list.   Health Maintenance:   Immunization History  Administered Date(s) Administered   Influenza,inj,Quad PF,6+ Mos 12/28/2016   PNEUMOCOCCAL CONJUGATE-20 02/23/2021   Tdap 10/26/2016   Health Maintenance  Topic Date Due   COVID-19 Vaccine (1) Never done   Zoster Vaccines- Shingrix (1 of 2) Never done   Fecal DNA (Cologuard)  Never done   OPHTHALMOLOGY EXAM  03/10/2021   FOOT EXAM  02/23/2022   Lung Cancer Screening  03/29/2022   Cervical  Cancer Screening (HPV/Pap Cotest)  07/01/2022   INFLUENZA VACCINE  08/16/2022   HEMOGLOBIN A1C  01/23/2023   MAMMOGRAM  02/21/2023   Diabetic kidney evaluation - Urine ACR  04/11/2023   Diabetic kidney evaluation - eGFR measurement  07/23/2023   DTaP/Tdap/Td (2 - Td or Tdap) 10/27/2026   Hepatitis C Screening  Completed   HIV Screening  Completed   HPV VACCINES  Aged Out   Patient Care Team: Lucky Cowboy, MD as PCP - General (Internal Medicine) Corky Crafts, MD as PCP - Cardiology (Cardiology) Kerrin Champagne, MD (Inactive) as Consulting Physician (Orthopedic Surgery)  Surgical History:  She has a past surgical history that includes Cholecystectomy (2005) and Cesarean section (2000). Family History:  Herfamily history includes Renal Disease in her son. She was adopted. Social History:  She reports that she has been smoking cigarettes. She started smoking about 41 years ago. She has a 50.4 pack-year smoking history. She has never used smokeless tobacco. She reports current alcohol use. She reports that she does not use drugs.   Review of  Systems: A complete ROS was performed with pertinent positives/negatives noted in the HPI. The remainder of the ROS are negative.  Physical Exam: Estimated body mass index is 27.13 kg/m as calculated from the following:   Height as of 09/13/22: 5' 7.5" (1.715 m).   Weight as of 09/13/22: 175 lb 12.8 oz (79.7 kg). There were no vitals taken for this visit. General Appearance: Well nourished, appears older than stated age, in no apparent distress.  Eyes: PERRLA, EOMs, conjunctiva no swelling or erythema Sinuses: No Frontal/maxillary tenderness  ENT/Mouth: Ext aud canals clear, normal light reflex with TMs without erythema, bulging. Good dentition. No erythema, swelling, or exudate on post pharynx. Tonsils not swollen or erythematous. Hearing normal. Missing extensive teeth.  Neck: Supple, thyroid normal. No bruits  Respiratory: Respiratory effort normal, BS equal bilaterally without rales, rhonchi, wheezing or stridor.  Cardio: RRR without murmurs, rubs or gallops. Brisk peripheral pulses with 1+ bilateral non-pitting edema to ankles.  Chest: symmetric, with normal excursions and percussion.  Breasts: Defer to GYN Abdomen: Soft, nontender, no guarding, rebound, hernias, masses, or organomegaly.  Lymphatics: Non tender without lymphadenopathy.  Genitourinary: Defer to GYN Musculoskeletal: Full ROM bil lower extremities, 5/5 strength, and normal gait. L shoulder with limited abduction and external rotation with pain.  Skin: Warm, dry without rashes, lesions, ecchymosis. Bilateral toe nails yellowed, brittle distally.  Neuro: Cranial nerves intact, reflexes equal bilaterally. Normal muscle tone, no cerebellar symptoms. Sensation intact to monofilament bilaterally.  Psych: Awake and oriented X 3, normal affect, Insight and Judgment appropriate.    Abimbola Aki 3:32 PM [facility]

## 2022-11-01 ENCOUNTER — Telehealth: Payer: Self-pay

## 2022-11-01 NOTE — Telephone Encounter (Signed)
Needs something for smoking cessation, for her work. Please advise.

## 2022-11-01 NOTE — Telephone Encounter (Signed)
Patient is going to call us back letting us know more specifically as to what she needs.

## 2022-11-05 ENCOUNTER — Ambulatory Visit: Payer: BC Managed Care – PPO | Admitting: Nurse Practitioner

## 2022-11-12 ENCOUNTER — Encounter: Payer: Self-pay | Admitting: Nurse Practitioner

## 2022-11-12 ENCOUNTER — Ambulatory Visit (INDEPENDENT_AMBULATORY_CARE_PROVIDER_SITE_OTHER): Payer: BC Managed Care – PPO | Admitting: Nurse Practitioner

## 2022-11-12 VITALS — BP 128/68 | HR 69 | Temp 97.6°F | Ht 67.5 in | Wt 169.6 lb

## 2022-11-12 DIAGNOSIS — Z79899 Other long term (current) drug therapy: Secondary | ICD-10-CM

## 2022-11-12 DIAGNOSIS — I10 Essential (primary) hypertension: Secondary | ICD-10-CM | POA: Diagnosis not present

## 2022-11-12 DIAGNOSIS — E66811 Obesity, class 1: Secondary | ICD-10-CM | POA: Diagnosis not present

## 2022-11-12 DIAGNOSIS — E785 Hyperlipidemia, unspecified: Secondary | ICD-10-CM | POA: Diagnosis not present

## 2022-11-12 DIAGNOSIS — I872 Venous insufficiency (chronic) (peripheral): Secondary | ICD-10-CM

## 2022-11-12 DIAGNOSIS — F324 Major depressive disorder, single episode, in partial remission: Secondary | ICD-10-CM

## 2022-11-12 DIAGNOSIS — E559 Vitamin D deficiency, unspecified: Secondary | ICD-10-CM

## 2022-11-12 DIAGNOSIS — Z87891 Personal history of nicotine dependence: Secondary | ICD-10-CM

## 2022-11-12 DIAGNOSIS — E1165 Type 2 diabetes mellitus with hyperglycemia: Secondary | ICD-10-CM

## 2022-11-12 DIAGNOSIS — E1169 Type 2 diabetes mellitus with other specified complication: Secondary | ICD-10-CM

## 2022-11-12 NOTE — Progress Notes (Signed)
Follow Up  Assessment and Plan:  Type 2 diabetes mellitus with hyperglycemia, without long-term current use of insulin (HCC) Continue Mounjaro Continue Glipizide PRN Continue Libre censoring. Education: Reviewed 'ABCs' of diabetes management  Discussed goals to be met and/or maintained include A1C (<7) Blood pressure (<130/80) Cholesterol (LDL <70) Continue Eye Exam yearly  Continue Dental Exam Q6 mo Discussed dietary recommendations Discussed Physical Activity recommendations Check A1C  Hyperlipidemia associated with type 2 diabetes mellitus (HCC) Decrease Rosuvastatin to 3 days weekly in hopes to improve BLE pain, cramping - possible statin myalgia.   Continue lifestyle modifications. Recommended diet heavy in fruits and veggies, omega 3's. Decrease consumption of animal meats, cheeses, and dairy products. Remain active and exercise as tolerated. Continue to monitor. Check lipids/TSH  Obesity (BMI 30.0-34.9) Trending down Discussed appropriate BMI Diet modification. Physical activity. Encouraged/praised to build confidence.  Major depressive disorder in partial remission, unspecified whether recurrent (HCC) Continue trazodone and therapy  Lifestyle discussed: diet/exerise, sleep hygiene, stress management, hydration  Hypertension Discussed DASH (Dietary Approaches to Stop Hypertension) DASH diet is lower in sodium than a typical American diet. Cut back on foods that are high in saturated fat, cholesterol, and trans fats. Eat more whole-grain foods, fish, poultry, and nuts Remain active and exercise as tolerated daily.  Monitor BP at home-Call if greater than 130/80.  Check CMP/CBC  Medication management All medications discussed and reviewed in full. All questions and concerns regarding medications addressed.    Current intermittent smoker Reports quitting 1 month ago  Smoking cessation instruction/counseling given:  counseled patient on the dangers of tobacco  use, advised patient to stop smoking, and reviewed strategies to maximize success   Venous insufficiency Stable/chronic, NOT NEW, encourage compression, increased walking, elevate when able, low sodium diet Continue to monitor  Vitamin D deficiency Continue supplement for a goal of 60-100 Monitor levels   Orders Placed This Encounter  Procedures   CBC with Differential/Platelet   COMPLETE METABOLIC PANEL WITH GFR   Lipid panel   Hemoglobin A1c   Notify office for further evaluation and treatment, questions or concerns if any reported s/s fail to improve.   The patient was advised to call back or seek an in-person evaluation if any symptoms worsen or if the condition fails to improve as anticipated.   Further disposition pending results of labs. Discussed med's effects and SE's.    I discussed the assessment and treatment plan with the patient. The patient was provided an opportunity to ask questions and all were answered. The patient agreed with the plan and demonstrated an understanding of the instructions.  Discussed med's effects and SE's. Screening labs and tests as requested with regular follow-up as recommended.  I provided 30 minutes of face-to-face time during this encounter including counseling, chart review, and critical decision making was preformed.  Today's Plan of Care is based on a patient-centered health care approach known as shared decision making - the decisions, tests and treatments allow for patient preferences and values to be balanced with clinical evidence.    Future Appointments  Date Time Provider Department Center  02/13/2023  2:00 PM Adela Glimpse, NP [facility] None  04/11/2023  2:00 PM Adela Glimpse, NP [facility] None    HPI  57 y.o. female  presents for general follow up. She has Type 2 diabetes mellitus with hyperglycemia, without long-term current use of insulin (HCC); Hypertension; Hyperlipidemia associated with type 2 diabetes mellitus  (HCC); Obesity (BMI 30.0-34.9); Major depression in partial remission (HCC); Discoloration and  thickening of nails both feet; Chronic venous insufficiency; Vitamin D deficiency; CKD stage 2 due to type 2 diabetes mellitus (HCC); Diabetic peripheral neuropathy associated with type 2 diabetes mellitus (HCC); TMJ (temporomandibular joint disorder); Former smoker (quit 05/14/2021, 50 pack year hx); Diabetic retinopathy (HCC); Recurrent low back pain episodes; Financial difficulty; At risk for medication nonadherence; and Pulmonary nodules on their problem list.   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 She is working 2-3 jobs.  Does not have much time for herself.  Takes care of husband who is on disability and dialysis.   She does have hx of recurrent major depression; taking trazodone 50 mg with good results, working with therapist re her husband's situation.   She does have some intermittent mid back pain, takes tizanidine 4 mg at night PRN, also takes rare ibuprofen. Has seen Dr. Otelia Sergeant for intermittent R shoulder pain, likely impingement, would like referral to see different provider today. Pain limits at job significantly. Would like to do PT but does not have the time.   She is an intermittent smoker with 50+ pack year history, recently quit again on 10/10/2020 but restarted, 1-1.5 pack currently. She had normal CXR 2019. Denies current respiratory sx. Will plan CT at age 78. Has noticed increase in BLE pain, thinks this could be related to taking statin daily.  BMI is Body mass index is 26.17 kg/m., she has been working on diet, exercise is limited. Recently started [facility], unable to tolerate Ozempic.  Has had success with weight loss and BG control. Wt Readings from Last 3 Encounters:  11/12/22 169 lb 9.6 oz (76.9 kg)  09/13/22 175 lb 12.8 oz (79.7 kg)  08/29/22 184 lb 6.4 oz (83.6 kg)   She has not been checking BPs at home, reports was been normal at dentist, Today their BP is  BP: 128/68  She does not workout. She denies chest pain, shortness of breath, dizziness in the last few months, but notes back in Nov 2022 had about 1 week of chest tightness/radiating around her back x 1 week. She had leads misplaced during EKG work up that showed possible MI, however, had follow up with Cardiology and work up was negative.   She is on cholesterol medication (didn't tolerate atorvastatin, pravastatin, had stiffness and joint achiness, has been taking Rosuvastatin but may have increase the BLE pain). Her cholesterol is not at goal. The cholesterol last visit was:   Lab Results  Component Value Date   CHOL 199 07/23/2022   HDL 58 07/23/2022   LDLCALC 116 (H) 07/23/2022   TRIG 136 07/23/2022   CHOLHDL 3.4 07/23/2022   She has been working on diet and exercise for T2DM.  Was on metformin ER 1000 mg BID, ozempic 1mg  - was tolerating had some increase in SE.  Start Mounajro 3 months ago without SE.  She is no longer taking Metformin.  Will take glipizide PRN if BG >200, she is not on bASA, she is not on ACE/ARB, she is on statin and denies foot ulcerations, increased appetite, nausea, polydipsia, polyuria, visual disturbances, vomiting, and weight loss.  She does have mild tingling burning in bil toes.  Last A1C in the office was:  Lab Results  Component Value Date   HGBA1C 6.9 (H) 07/23/2022   Last GFR: Lab Results  Component Value Date   GFRNONAA 80 05/25/2020   Patient is not currently on Vitamin D supplement, unsure of dose. Will check levels today  Lab Results  Component Value Date   VD25OH 54 04/11/2022       Current Medications:  Current Outpatient Medications on File Prior to Visit  Medication Sig Dispense Refill   albuterol (VENTOLIN HFA) 108 (90 Base) MCG/ACT inhaler Inhale 2 puffs into the lungs every 6 (six) hours as needed for wheezing or shortness of breath. 8 g 2   aspirin EC 81 MG tablet Take 81 mg by mouth daily. Swallow whole.     Cholecalciferol  (VITAMIN D3) 250 MCG (10000 UT) TABS Take one tablet daily 30 tablet 0   Continuous Glucose Receiver (DEXCOM G7 RECEIVER) DEVI Use to check blood sugar up to four times a day or as directed 1 each 2   Continuous Glucose Sensor (DEXCOM G7 SENSOR) MISC Use to check blood sugar up to four times a day or as directed. 2 each 3   ezetimibe (ZETIA) 10 MG tablet Take 1 tab daily for cholesterol and heart attack risk reduction. 90 tablet 3   glucose blood (ONETOUCH VERIO) test strip Check fasting sugar daily (prior to breakfast). 100 each 12   losartan (COZAAR) 25 MG tablet Take 1 tab daily for blood pressure and kidney protection. 90 tablet 3   Lysine (GNP L-LYSINE) 600 MG TABS Take one tablet daily     Magnesium 500 MG TABS Take one tablet daily 30 tablet    rosuvastatin (CRESTOR) 40 MG tablet Takes 1 tablet everyday 90 tablet 3   tirzepatide (MOUNJARO) 12.5 MG/0.5ML Pen Inject 12.5 mg into the skin once a week. 2 mL 2   tiZANidine (ZANAFLEX) 4 MG capsule TAKE 1 CAPSULE BY MOUTH 3 TIMES DAILY ASNEEDED FOR MUSCLE SPASMS 270 capsule 1   traZODone (DESYREL) 150 MG tablet Take 1/3-1 tablet daily for mood and sleep. 90 tablet 3   Varenicline Tartrate, Starter, (CHANTIX STARTING MONTH PAK) 0.5 MG X 11 & 1 MG X 42 TBPK As directed- 0.5 mg for 11 days then 1 mg for 42 days 53 each 0   Zinc 50 MG TABS Take by mouth.     glipiZIDE (GLUCOTROL) 5 MG tablet TAKE 1/2 TO 1 TABLET BY MOUTH 2 TO 3 TIMES DAILY WITH MEALS FOR DIABETES (Patient not taking: Reported on 11/12/2022) 90 tablet 0   metFORMIN (GLUCOPHAGE-XR) 500 MG 24 hr tablet Takes 2 tablets in the evening (Patient not taking: Reported on 11/12/2022) 360 tablet 1   No current facility-administered medications on file prior to visit.   Allergies:  Allergies  Allergen Reactions   Atorvastatin     Myalgias   Buspirone     "felt like I was going to pass out"    Pravastatin    Medical History:  She has Type 2 diabetes mellitus with hyperglycemia, without  long-term current use of insulin (HCC); Hypertension; Hyperlipidemia associated with type 2 diabetes mellitus (HCC); Obesity (BMI 30.0-34.9); Major depression in partial remission (HCC); Discoloration and thickening of nails both feet; Chronic venous insufficiency; Vitamin D deficiency; CKD stage 2 due to type 2 diabetes mellitus (HCC); Diabetic peripheral neuropathy associated with type 2 diabetes mellitus (HCC); TMJ (temporomandibular joint disorder); Former smoker (quit 05/14/2021, 50 pack year hx); Diabetic retinopathy (HCC); Recurrent low back pain episodes; Financial difficulty; At risk for medication nonadherence; and Pulmonary nodules on their problem list.   Health Maintenance:   Immunization History  Administered Date(s) Administered   Influenza,inj,Quad PF,6+ Mos 12/28/2016   PNEUMOCOCCAL CONJUGATE-20 02/23/2021   Tdap 10/26/2016   Health Maintenance  Topic Date Due   COVID-19  Vaccine (1) Never done   Zoster Vaccines- Shingrix (1 of 2) Never done   Fecal DNA (Cologuard)  Never done   OPHTHALMOLOGY EXAM  03/10/2021   FOOT EXAM  02/23/2022   Lung Cancer Screening  03/29/2022   Cervical Cancer Screening (HPV/Pap Cotest)  07/01/2022   INFLUENZA VACCINE  08/16/2022   HEMOGLOBIN A1C  01/23/2023   MAMMOGRAM  02/21/2023   Diabetic kidney evaluation - Urine ACR  04/11/2023   Diabetic kidney evaluation - eGFR measurement  07/23/2023   DTaP/Tdap/Td (2 - Td or Tdap) 10/27/2026   Hepatitis C Screening  Completed   HIV Screening  Completed   HPV VACCINES  Aged Out   Patient Care Team: Lucky Cowboy, MD as PCP - General (Internal Medicine) Corky Crafts, MD as PCP - Cardiology (Cardiology) Kerrin Champagne, MD (Inactive) as Consulting Physician (Orthopedic Surgery)  Surgical History:  She has a past surgical history that includes Cholecystectomy (2005) and Cesarean section (2000). Family History:  Herfamily history includes Renal Disease in her son. She was adopted. Social  History:  She reports that she has been smoking e-cigarettes. She has never used smokeless tobacco. She reports current alcohol use. She reports that she does not use drugs.   Review of Systems: A complete ROS was performed with pertinent positives/negatives noted in the HPI. The remainder of the ROS are negative.  Physical Exam: Estimated body mass index is 26.17 kg/m as calculated from the following:   Height as of this encounter: 5' 7.5" (1.715 m).   Weight as of this encounter: 169 lb 9.6 oz (76.9 kg). BP 128/68   Pulse 69   Temp 97.6 F (36.4 C)   Ht 5' 7.5" (1.715 m)   Wt 169 lb 9.6 oz (76.9 kg)   SpO2 99%   BMI 26.17 kg/m  General Appearance: Well nourished, appears older than stated age, in no apparent distress.  Eyes: PERRLA, EOMs, conjunctiva no swelling or erythema Sinuses: No Frontal/maxillary tenderness  ENT/Mouth: Ext aud canals clear, normal light reflex with TMs without erythema, bulging. Good dentition. No erythema, swelling, or exudate on post pharynx. Tonsils not swollen or erythematous. Hearing normal. Missing extensive teeth.  Neck: Supple, thyroid normal. No bruits  Respiratory: Respiratory effort normal, BS equal bilaterally without rales, rhonchi, wheezing or stridor.  Cardio: RRR without murmurs, rubs or gallops. Brisk peripheral pulses with 1+ bilateral non-pitting edema to ankles.  Chest: symmetric, with normal excursions and percussion.  Breasts: Defer to GYN Abdomen: Soft, nontender, no guarding, rebound, hernias, masses, or organomegaly.  Lymphatics: Non tender without lymphadenopathy.  Genitourinary: Defer to GYN Musculoskeletal: Full ROM bil lower extremities, 5/5 strength, and normal gait. L shoulder with limited abduction and external rotation with pain.  Skin: Warm, dry without rashes, lesions, ecchymosis. Bilateral toe nails yellowed, brittle distally.  Neuro: Cranial nerves intact, reflexes equal bilaterally. Normal muscle tone, no cerebellar  symptoms. Sensation intact to monofilament bilaterally.  Psych: Awake and oriented X 3, normal affect, Insight and Judgment appropriate.    Alakai Macbride 4:51 PM [facility]

## 2022-11-12 NOTE — Patient Instructions (Signed)

## 2022-11-13 LAB — CBC WITH DIFFERENTIAL/PLATELET
Absolute Lymphocytes: 3505 {cells}/uL (ref 850–3900)
Absolute Monocytes: 572 {cells}/uL (ref 200–950)
Basophils Absolute: 104 {cells}/uL (ref 0–200)
Basophils Relative: 1 %
Eosinophils Absolute: 354 {cells}/uL (ref 15–500)
Eosinophils Relative: 3.4 %
HCT: 38.3 % (ref 35.0–45.0)
Hemoglobin: 12.3 g/dL (ref 11.7–15.5)
MCH: 28.6 pg (ref 27.0–33.0)
MCHC: 32.1 g/dL (ref 32.0–36.0)
MCV: 89.1 fL (ref 80.0–100.0)
MPV: 10.4 fL (ref 7.5–12.5)
Monocytes Relative: 5.5 %
Neutro Abs: 5866 {cells}/uL (ref 1500–7800)
Neutrophils Relative %: 56.4 %
Platelets: 367 10*3/uL (ref 140–400)
RBC: 4.3 10*6/uL (ref 3.80–5.10)
RDW: 12.2 % (ref 11.0–15.0)
Total Lymphocyte: 33.7 %
WBC: 10.4 10*3/uL (ref 3.8–10.8)

## 2022-11-13 LAB — COMPLETE METABOLIC PANEL WITH GFR
AG Ratio: 1.6 (calc) (ref 1.0–2.5)
ALT: 93 U/L — ABNORMAL HIGH (ref 6–29)
AST: 31 U/L (ref 10–35)
Albumin: 4 g/dL (ref 3.6–5.1)
Alkaline phosphatase (APISO): 107 U/L (ref 37–153)
BUN/Creatinine Ratio: 32 (calc) — ABNORMAL HIGH (ref 6–22)
BUN: 28 mg/dL — ABNORMAL HIGH (ref 7–25)
CO2: 30 mmol/L (ref 20–32)
Calcium: 9.7 mg/dL (ref 8.6–10.4)
Chloride: 102 mmol/L (ref 98–110)
Creat: 0.88 mg/dL (ref 0.50–1.03)
Globulin: 2.5 g/dL (ref 1.9–3.7)
Glucose, Bld: 132 mg/dL — ABNORMAL HIGH (ref 65–99)
Potassium: 4.8 mmol/L (ref 3.5–5.3)
Sodium: 140 mmol/L (ref 135–146)
Total Bilirubin: 0.2 mg/dL (ref 0.2–1.2)
Total Protein: 6.5 g/dL (ref 6.1–8.1)
eGFR: 78 mL/min/{1.73_m2} (ref >=60)

## 2022-11-13 LAB — LIPID PANEL
Cholesterol: 210 mg/dL — ABNORMAL HIGH (ref <200)
HDL: 71 mg/dL (ref >=50)
LDL Cholesterol (Calc): 120 mg/dL — ABNORMAL HIGH
Non-HDL Cholesterol (Calc): 139 mg/dL — ABNORMAL HIGH (ref <130)
Total CHOL/HDL Ratio: 3 (calc) (ref <5.0)
Triglycerides: 89 mg/dL (ref <150)

## 2022-11-13 LAB — HEMOGLOBIN A1C
Hgb A1c MFr Bld: 6.9 %{Hb} — ABNORMAL HIGH (ref <5.7)
Mean Plasma Glucose: 151 mg/dL
eAG (mmol/L): 8.4 mmol/L

## 2022-11-28 ENCOUNTER — Encounter: Payer: Self-pay | Admitting: Nurse Practitioner

## 2022-11-29 ENCOUNTER — Other Ambulatory Visit: Payer: Self-pay | Admitting: Nurse Practitioner

## 2022-11-29 DIAGNOSIS — E1165 Type 2 diabetes mellitus with hyperglycemia: Secondary | ICD-10-CM

## 2022-11-29 MED ORDER — MOUNJARO 15 MG/0.5ML ~~LOC~~ SOAJ: 15.0000 mg | SUBCUTANEOUS | 3 refills | Status: DC

## 2022-12-06 ENCOUNTER — Encounter: Payer: Self-pay | Admitting: Nurse Practitioner

## 2022-12-07 ENCOUNTER — Ambulatory Visit
Admission: RE | Admit: 2022-12-07 | Discharge: 2022-12-07 | Disposition: A | Discharge status: Home / Self Care | Payer: BC Managed Care – PPO | Source: Ambulatory Visit | Attending: Nurse Practitioner | Admitting: Nurse Practitioner

## 2022-12-07 DIAGNOSIS — R229 Localized swelling, mass and lump, unspecified: Secondary | ICD-10-CM

## 2022-12-07 DIAGNOSIS — M7918 Myalgia, other site: Secondary | ICD-10-CM

## 2022-12-09 ENCOUNTER — Encounter: Payer: Self-pay | Admitting: Nurse Practitioner

## 2022-12-12 ENCOUNTER — Encounter: Payer: Self-pay | Admitting: Nurse Practitioner

## 2022-12-12 DIAGNOSIS — M533 Sacrococcygeal disorders, not elsewhere classified: Secondary | ICD-10-CM

## 2022-12-17 ENCOUNTER — Other Ambulatory Visit: Payer: Self-pay | Admitting: Nurse Practitioner

## 2022-12-17 DIAGNOSIS — M533 Sacrococcygeal disorders, not elsewhere classified: Secondary | ICD-10-CM

## 2022-12-25 ENCOUNTER — Encounter: Payer: Self-pay | Admitting: Nurse Practitioner

## 2022-12-25 ENCOUNTER — Telehealth: Payer: Self-pay

## 2022-12-25 NOTE — Telephone Encounter (Signed)
Patient reports that she had missed about two weeks of taking the [facility] and now has started taking 15mg . Since starting the 15mg , blood sugar has plummeted. Do you suggest going back to the 12.5mg ?

## 2022-12-26 ENCOUNTER — Encounter: Payer: Self-pay | Admitting: Nurse Practitioner

## 2022-12-27 NOTE — Telephone Encounter (Signed)
See next note about staying on the 15mg 

## 2023-01-11 ENCOUNTER — Encounter: Payer: Self-pay | Admitting: Orthopaedic Surgery

## 2023-01-11 ENCOUNTER — Ambulatory Visit (INDEPENDENT_AMBULATORY_CARE_PROVIDER_SITE_OTHER): Payer: BC Managed Care – PPO | Admitting: Orthopaedic Surgery

## 2023-01-11 VITALS — BP 80/50 | HR 78 | Ht 67.5 in | Wt 160.0 lb

## 2023-01-11 DIAGNOSIS — M533 Sacrococcygeal disorders, not elsewhere classified: Secondary | ICD-10-CM | POA: Diagnosis not present

## 2023-01-11 NOTE — Progress Notes (Unsigned)
   Office Visit Note   Patient: Danielle Mccall           Date of Birth: [birth year]           MRN: [id number] Visit Date: 01/11/2023              Requested by: Adela Glimpse, NP [address] [place],  [place] [address] PCP: Lucky Cowboy, MD   Assessment & Plan: Visit Diagnoses: No diagnosis found.  Plan: ***  Follow-Up Instructions: No follow-ups on file.   Orders:  No orders of the defined types were placed in this encounter.  No orders of the defined types were placed in this encounter.     Procedures: No procedures performed   Clinical Data: No additional findings.   Subjective: Chief Complaint  Patient presents with   Tailbone Pain    HPI  Review of Systems   Objective: Vital Signs: BP (!) 80/50   Pulse 78   Ht 5' 7.5" (1.715 m)   Wt 160 lb (72.6 kg)   BMI 24.69 kg/m   Physical Exam  Ortho Exam  Specialty Comments:  No specialty comments available.  Imaging: No results found.   PMFS History: Patient Active Problem List   Diagnosis Date Noted   Pulmonary nodules 04/26/2021   At risk for medication nonadherence 02/23/2021   Financial difficulty 10/12/2020   Recurrent low back pain episodes 06/09/2020   Diabetic retinopathy (HCC) 05/19/2020   TMJ (temporomandibular joint disorder) 02/24/2020   Former smoker (quit 05/14/2021, 50 pack year hx) 02/24/2020   Diabetic peripheral neuropathy associated with type 2 diabetes mellitus (HCC) 11/17/2018   CKD stage 2 due to type 2 diabetes mellitus (HCC) 11/13/2018   Vitamin D deficiency 05/22/2018   Discoloration and thickening of nails both feet 02/13/2018   Chronic venous insufficiency 02/13/2018   Hypertension 06/23/2017   Hyperlipidemia associated with type 2 diabetes mellitus (HCC) 06/23/2017   Obesity (BMI 30.0-34.9) 06/23/2017   Major depression in partial remission (HCC) 06/23/2017   Type 2 diabetes mellitus with hyperglycemia, without long-term current use of insulin  (HCC) 10/26/2016   Past Medical History:  Diagnosis Date   Leg pain, left    Statin intolerance 03/28/2018    Family History  Adopted: Yes  Problem Relation Age of Onset   Renal Disease Son     Past Surgical History:  Procedure Laterality Date   CESAREAN SECTION  2000   CHOLECYSTECTOMY  2005   Social History   Occupational History   Not on file  Tobacco Use   Smoking status: Every Day    Types: E-cigarettes   Smokeless tobacco: Never  Vaping Use   Vaping status: Never Used  Substance and Sexual Activity   Alcohol use: Yes    Comment: on rare occasssions   Drug use: No   Sexual activity: Yes    Partners: Male    Birth control/protection: None

## 2023-01-13 DIAGNOSIS — M533 Sacrococcygeal disorders, not elsewhere classified: Secondary | ICD-10-CM | POA: Insufficient documentation

## 2023-01-24 ENCOUNTER — Ambulatory Visit
Admission: RE | Admit: 2023-01-24 | Discharge: 2023-01-24 | Disposition: A | Discharge status: Home / Self Care | Payer: 59 | Source: Ambulatory Visit | Attending: Orthopaedic Surgery | Admitting: Orthopaedic Surgery

## 2023-01-24 DIAGNOSIS — M533 Sacrococcygeal disorders, not elsewhere classified: Secondary | ICD-10-CM

## 2023-01-31 ENCOUNTER — Encounter: Payer: Self-pay | Admitting: Nurse Practitioner

## 2023-02-12 ENCOUNTER — Ambulatory Visit: Payer: 59 | Admitting: Orthopaedic Surgery

## 2023-02-13 ENCOUNTER — Ambulatory Visit: Payer: Self-pay | Admitting: Nurse Practitioner

## 2023-02-19 ENCOUNTER — Encounter: Payer: Self-pay | Admitting: Orthopaedic Surgery

## 2023-02-19 ENCOUNTER — Ambulatory Visit: Payer: 59 | Admitting: Orthopaedic Surgery

## 2023-02-19 VITALS — BP 103/55 | HR 59 | Ht 67.0 in | Wt 160.0 lb

## 2023-02-19 DIAGNOSIS — M533 Sacrococcygeal disorders, not elsewhere classified: Secondary | ICD-10-CM

## 2023-02-19 NOTE — Progress Notes (Signed)
 Office Visit Note   Patient: Danielle Mccall           Date of Birth: [birth year]           MRN: [id number] Visit Date: 02/19/2023              Requested by: Tonita Fallow, MD [address] [place],  [place] [address] PCP: Tonita Fallow, MD   Assessment & Plan: Visit Diagnoses:  1. Sacral pain     Plan: Calcified nodules over the distal sacrum coccyx junction not attached to the sacrum and freely mobile and subtendinous tissue.  There are tender bothersome without skin ulceration.  Plan would be excision as an outpatient.  Expected time out of work completed 2 to 3-week range.  She has 2 jobs 1 is less physically demanding than the other.  Risks of infection discussed questions were elicited and answered.  She will require Ancef prophylaxis preop.  Will plan on sending the nodules for pathologic exam.  No changes on MRI that would suggest malignancy.  Follow-Up Instructions: No follow-ups on file.   Orders:  No orders of the defined types were placed in this encounter.  No orders of the defined types were placed in this encounter.     Procedures: No procedures performed   Clinical Data: No additional findings.   Subjective: Chief Complaint  Patient presents with   Tailbone Pain    MRI review sacrum    HPI 58 year old female returns post MRI scan.  She has subcutaneous palpable nodules over the sacrum distally 3.3 x 1.8 x 2.1 cm and feel like hard calcified marbles.  No specific history of fall or infection that she can recall.  Patient's had an MRI scan which again shows changes consistent with most likely either hematoma or remote infection with calcification.  MRI scan is reviewed today.  She has lost some weight and now they are more prominent bothers her with sitting wakes her up when she rolls over and states she wants to have them removed.  Review of Systems all systems noncontributory to HPI.   Objective: Vital Signs: BP (!) 103/55    Pulse (!) 59   Ht 5' 7 (1.702 m)   Wt 160 lb (72.6 kg)   BMI 25.06 kg/m   Physical Exam Constitutional:      Appearance: She is well-developed.  HENT:     Head: Normocephalic.     Right Ear: External ear normal.     Left Ear: External ear normal. There is no impacted cerumen.  Eyes:     Pupils: Pupils are equal, round, and reactive to light.  Neck:     Thyroid : No thyromegaly.     Trachea: No tracheal deviation.  Cardiovascular:     Rate and Rhythm: Normal rate.  Pulmonary:     Effort: Pulmonary effort is normal.  Abdominal:     Palpations: Abdomen is soft.  Musculoskeletal:     Cervical back: No rigidity.  Skin:    General: Skin is warm and dry.  Neurological:     Mental Status: She is alert and oriented to person, place, and time.  Psychiatric:        Behavior: Behavior normal.     Ortho Exam  Specialty Comments:  No specialty comments available.  Imaging: Narrative & Impression  CLINICAL DATA:  Palpable abnormality at base of tailbone   EXAM: SACRUM AND COCCYX - 2+ VIEW   COMPARISON:  06/07/2020   FINDINGS: Pelvic  inlet, pelvic outlet, and lateral views of the sacrum and coccyx are obtained. Along the dorsal margin of the distal sacrum there is a 3.3 x 1.8 x 2.1 cm area of calcification within the subcutaneous tissues, presumably corresponding to the palpable abnormality. This appears separate from the underlying sacrum, and there is no associated bony destruction or periosteal reaction. There are no acute displaced fractures. Sacroiliac joints are normal. Visualized portions of the bony pelvis are unremarkable.   IMPRESSION: 1. Dense area of subcutaneous calcification measuring up to 3.3 cm along the dorsal margin of the distal sacrum, corresponding to the described palpable abnormality. This is unchanged since prior exam from 2022, most consistent with benign etiology likely related to dystrophic calcification from previous trauma or infection.      Electronically Signed   By: Ozell Daring M.D.   On: 12/12/2022 09:17   Narrative & Impression  CLINICAL DATA:  Palpable abnormality at the base of the tailbone. Calcifications on radiographs. Patient reports pain when sitting for 8 months. No known injury.   EXAM: MRI SACRUM WITHOUT CONTRAST   TECHNIQUE: Multiplanar multi-sequence MR imaging of the sacrum was performed. No intravenous contrast was administered.   COMPARISON:  Radiographs 12/07/2022. Lumbar spine radiographs 06/07/2020.   FINDINGS: Bones/Joint/Cartilage   No evidence of acute fracture, dislocation or aggressive osseous lesion. Mild sacroiliac degenerative changes bilaterally. No erosive changes. There is lower lumbar spondylosis with probable ankylosis across the lumbosacral junction. The visualized hip joints appear unremarkable.   Ligaments   No significant ligamentous abnormalities.   Muscles and Tendons The posterior pelvic musculature appears unremarkable. The piriformis muscles appear symmetric.   Soft tissue The previously demonstrated calcifications posterior to the distal sacrum are noted within the subcutaneous fat, measuring approximately 2.5 x 2.0 x 1.0 cm. There is no connection with the sacrum or coccyx. The adjacent musculature appears normal. No evidence of associated soft tissue mass or fluid collection on noncontrast imaging. Minimal surrounding subcutaneous edema. As previously mentioned, these calcifications are grossly unchanged from previous radiographs of 06/07/2020, likely related to remote trauma or remote infection. C-section defect noted incidentally in the uterus.   IMPRESSION: 1. The previously demonstrated calcifications posterior to the distal sacrum are noted within the subcutaneous fat, grossly unchanged from previous radiographs of 06/07/2020, likely related to remote trauma or remote infection. No evidence of associated soft tissue mass or fluid collection  on noncontrast imaging. 2. No acute osseous findings. Mild sacroiliac degenerative changes bilaterally. 3. Lower lumbar spondylosis with probable ankylosis across the lumbosacral junction.     Electronically Signed   By: Elsie Perone M.D.   On: 01/31/2023 14:01       PMFS History: Patient Active Problem List   Diagnosis Date Noted   Sacral pain 01/13/2023   Pulmonary nodules 04/26/2021   At risk for medication nonadherence 02/23/2021   Financial difficulty 10/12/2020   Recurrent low back pain episodes 06/09/2020   Diabetic retinopathy (HCC) 05/19/2020   TMJ (temporomandibular joint disorder) 02/24/2020   Former smoker (quit 05/14/2021, 50 pack year hx) 02/24/2020   Diabetic peripheral neuropathy associated with type 2 diabetes mellitus (HCC) 11/17/2018   CKD stage 2 due to type 2 diabetes mellitus (HCC) 11/13/2018   Vitamin D  deficiency 05/22/2018   Discoloration and thickening of nails both feet 02/13/2018   Chronic venous insufficiency 02/13/2018   Hypertension 06/23/2017   Hyperlipidemia associated with type 2 diabetes mellitus (HCC) 06/23/2017   Obesity (BMI 30.0-34.9) 06/23/2017   Major depression in partial  remission (HCC) 06/23/2017   Type 2 diabetes mellitus with hyperglycemia, without long-term current use of insulin  (HCC) 10/26/2016   Past Medical History:  Diagnosis Date   Leg pain, left    Statin intolerance 03/28/2018    Family History  Adopted: Yes  Problem Relation Age of Onset   Renal Disease Son     Past Surgical History:  Procedure Laterality Date   CESAREAN SECTION  2000   CHOLECYSTECTOMY  2005   Social History   Occupational History   Not on file  Tobacco Use   Smoking status: Every Day    Types: E-cigarettes   Smokeless tobacco: Never  Vaping Use   Vaping status: Never Used  Substance and Sexual Activity   Alcohol use: Yes    Comment: on rare occasssions   Drug use: No   Sexual activity: Yes    Partners: Male    Birth  control/protection: None

## 2023-02-22 ENCOUNTER — Encounter: Payer: Self-pay | Admitting: Internal Medicine

## 2023-02-26 ENCOUNTER — Other Ambulatory Visit: Payer: Self-pay

## 2023-02-26 MED ORDER — LOSARTAN POTASSIUM 25 MG PO TABS: ORAL_TABLET | ORAL | 0 refills | Status: AC

## 2023-02-28 ENCOUNTER — Encounter: Payer: Self-pay | Admitting: Nurse Practitioner

## 2023-03-22 ENCOUNTER — Telehealth: Payer: Self-pay | Admitting: Orthopedic Surgery

## 2023-03-22 NOTE — Telephone Encounter (Signed)
 Danielle Mccall is scheduled for excision of sacral mass with Dr. Ophelia Charter on Monday 03/25/23.  She left a voicemail asking how long it will be before she will be able to drive after surgery.  She can be reached at [phone].

## 2023-03-25 ENCOUNTER — Other Ambulatory Visit: Payer: Self-pay

## 2023-03-25 ENCOUNTER — Other Ambulatory Visit: Payer: Self-pay | Admitting: Orthopaedic Surgery

## 2023-03-25 DIAGNOSIS — R229 Localized swelling, mass and lump, unspecified: Secondary | ICD-10-CM | POA: Diagnosis not present

## 2023-03-25 MED ORDER — HYDROCODONE-ACETAMINOPHEN 5-325 MG PO TABS: 1.0000 | ORAL_TABLET | Freq: Four times a day (QID) | ORAL | 0 refills | Status: AC | PRN

## 2023-03-26 ENCOUNTER — Encounter: Payer: Self-pay | Admitting: Orthopaedic Surgery

## 2023-03-30 LAB — SURGICAL PATHOLOGY

## 2023-04-02 ENCOUNTER — Ambulatory Visit (INDEPENDENT_AMBULATORY_CARE_PROVIDER_SITE_OTHER): Payer: 59 | Admitting: Orthopaedic Surgery

## 2023-04-02 ENCOUNTER — Encounter: Payer: Self-pay | Admitting: Orthopaedic Surgery

## 2023-04-02 VITALS — BP 82/44 | HR 71 | Ht 67.0 in | Wt 160.0 lb

## 2023-04-02 DIAGNOSIS — M533 Sacrococcygeal disorders, not elsewhere classified: Secondary | ICD-10-CM

## 2023-04-02 NOTE — Progress Notes (Signed)
   Post-Op Visit Note   Patient: Danielle Mccall           Date of Birth: Aug 09, 1965           MRN: [id number] Visit Date: 04/02/2023 PCP: Lucky Cowboy, MD   Assessment & Plan: Patient returns post sacral subtendinous mass excision.  Incision looks good she can remove the Steri-Strips in a week if they are still there.  Work slip given for resuming work on the 25th.  Path report showed dystrophic calcification.  She is happy with the results of surgery return as needed.  Chief Complaint:  Chief Complaint  Patient presents with   Routine Post Op    03/25/2023 Excision sacral mass   Visit Diagnoses:  1. Sacral pain     Plan: Patient is happy the results of surgery she has no pain with sitting.  Path report reviewed return as needed.  Follow-Up Instructions: No follow-ups on file.   Orders:  No orders of the defined types were placed in this encounter.  No orders of the defined types were placed in this encounter.   Imaging: No results found.  PMFS History: Patient Active Problem List   Diagnosis Date Noted   Sacral pain 01/13/2023   Pulmonary nodules 04/26/2021   At risk for medication nonadherence 02/23/2021   Financial difficulty 10/12/2020   Recurrent low back pain episodes 06/09/2020   Diabetic retinopathy (HCC) 05/19/2020   TMJ (temporomandibular joint disorder) 02/24/2020   Former smoker (quit 05/14/2021, 50 pack year hx) 02/24/2020   Diabetic peripheral neuropathy associated with type 2 diabetes mellitus (HCC) 11/17/2018   CKD stage 2 due to type 2 diabetes mellitus (HCC) 11/13/2018   Vitamin D deficiency 05/22/2018   Discoloration and thickening of nails both feet 02/13/2018   Chronic venous insufficiency 02/13/2018   Hypertension 06/23/2017   Hyperlipidemia associated with type 2 diabetes mellitus (HCC) 06/23/2017   Obesity (BMI 30.0-34.9) 06/23/2017   Major depression in partial remission (HCC) 06/23/2017   Type 2 diabetes mellitus with hyperglycemia,  without long-term current use of insulin (HCC) 10/26/2016   Past Medical History:  Diagnosis Date   Leg pain, left    Statin intolerance 03/28/2018    Family History  Adopted: Yes  Problem Relation Age of Onset   Renal Disease Son     Past Surgical History:  Procedure Laterality Date   CESAREAN SECTION  2000   CHOLECYSTECTOMY  2005   Social History   Occupational History   Not on file  Tobacco Use   Smoking status: Every Day    Types: E-cigarettes   Smokeless tobacco: Never  Vaping Use   Vaping status: Never Used  Substance and Sexual Activity   Alcohol use: Yes    Comment: on rare occasssions   Drug use: No   Sexual activity: Yes    Partners: Male    Birth control/protection: None

## 2023-04-11 ENCOUNTER — Encounter: Payer: Self-pay | Admitting: Nurse Practitioner

## 2023-05-06 ENCOUNTER — Other Ambulatory Visit: Payer: Self-pay | Admitting: Internal Medicine

## 2023-05-06 DIAGNOSIS — E1165 Type 2 diabetes mellitus with hyperglycemia: Secondary | ICD-10-CM

## 2023-05-06 DIAGNOSIS — M62830 Muscle spasm of back: Secondary | ICD-10-CM

## 2023-05-06 NOTE — Telephone Encounter (Signed)
 Copied from CRM [id number]. Topic: Clinical - Medication Refill >> May 06, 2023  1:38 PM Everette C wrote: Most Recent Primary Care Visit:   Medication: tiZANidine  (ZANAFLEX ) 4 MG capsule [[id number]]  tirzepatide  (MOUNJARO ) 15 MG/0.5ML Pen [[id number]]  Freestyle Libre 3   Has the patient contacted their pharmacy? Yes (Agent: If no, request that the patient contact the pharmacy for the refill. If patient does not wish to contact the pharmacy document the reason why and proceed with request.) (Agent: If yes, when and what did the pharmacy advise?)  Is this the correct pharmacy for this prescription? Yes If no, delete pharmacy and type the correct one.  This is the patient's preferred pharmacy:  [facility] - [place], [place] - [address] [place] [address] Phone: [phone] Fax: [phone]   Has the prescription been filled recently? Yes  Is the patient out of the medication? Yes  Has the patient been seen for an appointment in the last year OR does the patient have an upcoming appointment? Yes  Can we respond through MyChart? No  Agent: Please be advised that Rx refills may take up to 3 business days. We ask that you follow-up with your pharmacy.

## 2023-05-08 DIAGNOSIS — M62830 Muscle spasm of back: Secondary | ICD-10-CM

## 2023-05-08 DIAGNOSIS — E1165 Type 2 diabetes mellitus with hyperglycemia: Secondary | ICD-10-CM

## 2023-05-22 ENCOUNTER — Other Ambulatory Visit: Payer: Self-pay | Admitting: Family

## 2023-05-22 DIAGNOSIS — E1165 Type 2 diabetes mellitus with hyperglycemia: Secondary | ICD-10-CM

## 2023-05-22 DIAGNOSIS — M62830 Muscle spasm of back: Secondary | ICD-10-CM

## 2023-05-22 MED ORDER — TIZANIDINE HCL 4 MG PO CAPS: ORAL_CAPSULE | ORAL | 1 refills | Status: AC

## 2023-05-22 MED ORDER — MOUNJARO 15 MG/0.5ML ~~LOC~~ SOAJ: 15.0000 mg | SUBCUTANEOUS | 0 refills | Status: AC

## 2023-05-22 MED ORDER — TIZANIDINE HCL 4 MG PO CAPS: ORAL_CAPSULE | ORAL | 0 refills | Status: AC

## 2023-05-22 MED ORDER — MOUNJARO 15 MG/0.5ML ~~LOC~~ SOAJ: 15.0000 mg | SUBCUTANEOUS | 3 refills | Status: AC

## 2023-05-22 NOTE — Telephone Encounter (Signed)
 Pt has now booked NP appt, see attached photo for appt info. Pt is needing pending meds refilled

## 2023-05-22 NOTE — Addendum Note (Signed)
 Addended by: Maxx Calaway E on: 05/22/2023 01:49 PM   Modules accepted: Orders

## 2023-07-18 ENCOUNTER — Other Ambulatory Visit: Payer: Self-pay | Admitting: Family

## 2023-07-24 ENCOUNTER — Telehealth: Payer: Self-pay | Admitting: *Deleted

## 2023-07-24 ENCOUNTER — Other Ambulatory Visit: Payer: Self-pay | Admitting: Family

## 2023-07-24 NOTE — Telephone Encounter (Signed)
 Copied from CRM [id number]. Topic: Clinical - Prescription Issue >> Jul 24, 2023 10:04 AM Drema MATSU wrote: Reason for CRM: Sam with [facility] is calling to check on ezetimibe  (ZETIA ) 10 MG tablet. Please call to inform Sam if it has been denied or filled somewhere else.

## 2023-07-25 ENCOUNTER — Telehealth: Payer: Self-pay | Admitting: Internal Medicine

## 2023-07-25 NOTE — Telephone Encounter (Signed)
 Copied from CRM [id number]. Topic: Clinical - Medication Question >> Jul 25, 2023  9:44 AM Burnard DEL wrote: Reason for CRM: [facility] has called over several times requesting a refill on medication ezetimibe  (ZETIA ) 10 MG tablet and  losartan  (COZAAR ) 25 MG tablet. Pharmacist stated that hey have sent over several request and has not gotten a response from [facility] and patient has been contacting the pharmacy several times. Patient is currently out of these medications.  ---  Are you able to assist?

## 2023-11-18 ENCOUNTER — Encounter: Payer: Self-pay | Admitting: Radiology
